# Patient Record
Sex: Female | Born: 1950 | Race: White | Hispanic: No | Marital: Married | Smoking: Never smoker
Health system: Southern US, Community
[De-identification: ages and names within clinical notes are randomized; demographics above are authoritative.]

## PROBLEM LIST (undated history)

## (undated) DIAGNOSIS — M25551 Pain in right hip: Secondary | ICD-10-CM

## (undated) DIAGNOSIS — K279 Peptic ulcer, site unspecified, unspecified as acute or chronic, without hemorrhage or perforation: Secondary | ICD-10-CM

## (undated) DIAGNOSIS — K219 Gastro-esophageal reflux disease without esophagitis: Secondary | ICD-10-CM

## (undated) DIAGNOSIS — F419 Anxiety disorder, unspecified: Secondary | ICD-10-CM

## (undated) DIAGNOSIS — E785 Hyperlipidemia, unspecified: Secondary | ICD-10-CM

## (undated) DIAGNOSIS — M8580 Other specified disorders of bone density and structure, unspecified site: Secondary | ICD-10-CM

## (undated) DIAGNOSIS — R51 Headache: Secondary | ICD-10-CM

## (undated) DIAGNOSIS — R519 Headache, unspecified: Secondary | ICD-10-CM

## (undated) DIAGNOSIS — B029 Zoster without complications: Secondary | ICD-10-CM

## (undated) DIAGNOSIS — M797 Fibromyalgia: Secondary | ICD-10-CM

## (undated) HISTORY — DX: Other specified disorders of bone density and structure, unspecified site: M85.80

## (undated) HISTORY — DX: Anxiety disorder, unspecified: F41.9

## (undated) HISTORY — DX: Headache: R51

## (undated) HISTORY — DX: Fibromyalgia: M79.7

## (undated) HISTORY — DX: Hyperlipidemia, unspecified: E78.5

## (undated) HISTORY — DX: Peptic ulcer, site unspecified, unspecified as acute or chronic, without hemorrhage or perforation: K27.9

## (undated) HISTORY — DX: Gastro-esophageal reflux disease without esophagitis: K21.9

## (undated) HISTORY — DX: Pain in right hip: M25.551

## (undated) HISTORY — DX: Zoster without complications: B02.9

## (undated) HISTORY — DX: Headache, unspecified: R51.9

## (undated) HISTORY — PX: TOTAL HIP ARTHROPLASTY: SHX124

---

## 2004-08-23 ENCOUNTER — Emergency Department (HOSPITAL_COMMUNITY): Admission: EM | Admit: 2004-08-23 | Discharge: 2004-08-23 | Payer: Self-pay | Admitting: Emergency Medicine

## 2006-06-30 ENCOUNTER — Ambulatory Visit: Payer: Self-pay | Admitting: Cardiology

## 2006-07-11 ENCOUNTER — Encounter: Payer: Self-pay | Admitting: Cardiology

## 2006-07-11 ENCOUNTER — Ambulatory Visit: Payer: Self-pay

## 2008-05-10 ENCOUNTER — Ambulatory Visit (HOSPITAL_COMMUNITY): Admission: RE | Admit: 2008-05-10 | Discharge: 2008-05-10 | Payer: Self-pay | Admitting: Family Medicine

## 2008-12-14 ENCOUNTER — Ambulatory Visit (HOSPITAL_COMMUNITY): Admission: RE | Admit: 2008-12-14 | Discharge: 2008-12-14 | Payer: Self-pay | Admitting: Family Medicine

## 2010-12-14 NOTE — Assessment & Plan Note (Signed)
[facility]                            CARDIOLOGY OFFICE NOTE   Leon, Cynthia                   MRN:          [id number]  DATE:06/30/2006                            DOB:          December 28, 1950    REFERRING PHYSICIAN:  Dr. Lindaann Pascal with [facility].   REASON FOR VISIT:  Chest pain.   HISTORY OF PRESENT ILLNESS:  [name] is a 60 year old woman with no  reported history of hypertension, type 2 diabetes mellitus, or chronic  lung disease.  She is a nonsmoker but states that her husband smokes.  She has no clearly documented history of coronary artery disease or  myocardial infarction.  She reports an episode approximately 18 months  ago of being in a meeting with family and suddenly feeling the room  spin associated with nausea, emesis, and chest pain.  She apparently  passed out and was evaluated at [facility] and was told  that she had a anxiety attack.  She has not had any episodes like this  subsequently, but does experience intermittent chest tightness  especially when she is emotionally upset.  She also has a pain in her  posterior neck and head and has frequent headaches which she attributes  to migraines.  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  She has not had any prior  cardiac risk stratification as best I can tell.  Her electrocardiogram  shows sinus rhythm with borderline low voltage and nonspecific ST-T wave  changes.  We have been asked to evaluate her further, specifically to  consider stress testing.   ALLERGIES:  ASPIRIN intolerance due to gastrointestinal upset.   CURRENT MEDICATIONS:  1. Lipitor 20 mg p.o. q.h.s.  2. Metazopine 15 mg p.o. q.h.s.  3. Nexium 40 mg p.o. daily.  4. Multivitamin once daily.   PAST MEDICAL HISTORY:  As outline in the history of present illness.  She also reports problems with  gastroesophageal reflux disease and  hyperlipidemia.   REVIEW OF SYSTEMS:  Described in the history of present illness.  The  patient describes herself as being fairly anxious and hyper.  She does  not have any wheezing.  She does have some seasonal allergies.  She  denies any palpitations, syncope, or claudication recently.   FAMILY HISTORY:  Significant for some type of heart problem in the  patient's sister who developed this in her 62's.   SOCIAL HISTORY:  The patient is married.  She has three children.  She  works in a Higher education careers adviser.  She denies any tobacco or  alcohol use.   PHYSICAL EXAMINATION:  VITAL SIGNS:  Blood pressure 106/72, heart rate  76, weight 199 pounds.  GENERAL:  The patient is comfortable and in no acute distress.  HEENT:  Conjunctivae and lids normal.  Oropharynx is clear.  NECK:  Supple without elevated jugular venous pressure or loud bruits.  No thyromegaly is noted.  LUNGS:  Clear without labored breathing.  No wheezing is noted.  HEART:  Regular rate and rhythm  without loud murmur or S3 gallop.  There  is no pericardial rub.  ABDOMEN:  Soft with normal active bowel sounds.  No tenderness.  No  bruits.  EXTREMITIES:  No significant pitting edema.  Distal pulses are 2+.  SKIN:  No ulcerative changes are noted.  MUSCULOSKELETAL:  Mild kyphosis noted.  NEUROPSYCHIATRIC:  The patient is alert and oriented x3.  She seems  somewhat anxious and fidgety during the interview.   IMPRESSION/RECOMMENDATIONS:  1. As outlined above.  We discussed proceeding with additional cardiac      risk stratification including an exercise Myoview and two-      dimensional echocardiogram.  We will arrange these studies and have      her follow up to review the results.  If reassuring, may want to      consider a pulmonary evaluation particularly with her occasional      dry cough with activity.  She may have some element of underlying      reactive airways  disease.  2. Further plans to follow.     Jonelle Sidle, MD  Electronically Signed    SGM/MedQ  DD: 06/30/2006  DT: 07/01/2006  Job #: [id number]

## 2010-12-20 ENCOUNTER — Encounter: Payer: Self-pay | Admitting: Nurse Practitioner

## 2011-10-10 DIAGNOSIS — G4700 Insomnia, unspecified: Secondary | ICD-10-CM | POA: Diagnosis not present

## 2011-10-10 DIAGNOSIS — E785 Hyperlipidemia, unspecified: Secondary | ICD-10-CM | POA: Diagnosis not present

## 2011-11-28 DIAGNOSIS — L0390 Cellulitis, unspecified: Secondary | ICD-10-CM | POA: Diagnosis not present

## 2011-11-28 DIAGNOSIS — L0291 Cutaneous abscess, unspecified: Secondary | ICD-10-CM | POA: Diagnosis not present

## 2012-03-24 DIAGNOSIS — Z1231 Encounter for screening mammogram for malignant neoplasm of breast: Secondary | ICD-10-CM | POA: Diagnosis not present

## 2012-06-01 DIAGNOSIS — J019 Acute sinusitis, unspecified: Secondary | ICD-10-CM | POA: Diagnosis not present

## 2012-06-22 DIAGNOSIS — M1610 Unilateral primary osteoarthritis, unspecified hip: Secondary | ICD-10-CM | POA: Diagnosis not present

## 2012-06-22 DIAGNOSIS — M25551 Pain in right hip: Secondary | ICD-10-CM | POA: Insufficient documentation

## 2012-06-22 DIAGNOSIS — M25559 Pain in unspecified hip: Secondary | ICD-10-CM | POA: Diagnosis not present

## 2012-06-22 DIAGNOSIS — M169 Osteoarthritis of hip, unspecified: Secondary | ICD-10-CM | POA: Diagnosis not present

## 2012-07-01 DIAGNOSIS — M169 Osteoarthritis of hip, unspecified: Secondary | ICD-10-CM | POA: Diagnosis not present

## 2012-07-01 DIAGNOSIS — M1610 Unilateral primary osteoarthritis, unspecified hip: Secondary | ICD-10-CM | POA: Diagnosis not present

## 2012-08-25 DIAGNOSIS — E785 Hyperlipidemia, unspecified: Secondary | ICD-10-CM | POA: Insufficient documentation

## 2012-09-02 DIAGNOSIS — Z01818 Encounter for other preprocedural examination: Secondary | ICD-10-CM | POA: Diagnosis not present

## 2012-09-02 DIAGNOSIS — M1610 Unilateral primary osteoarthritis, unspecified hip: Secondary | ICD-10-CM | POA: Diagnosis not present

## 2012-09-02 DIAGNOSIS — M169 Osteoarthritis of hip, unspecified: Secondary | ICD-10-CM | POA: Diagnosis not present

## 2012-09-02 DIAGNOSIS — R079 Chest pain, unspecified: Secondary | ICD-10-CM | POA: Diagnosis not present

## 2012-09-02 DIAGNOSIS — R0789 Other chest pain: Secondary | ICD-10-CM | POA: Diagnosis not present

## 2012-09-02 DIAGNOSIS — E785 Hyperlipidemia, unspecified: Secondary | ICD-10-CM | POA: Diagnosis not present

## 2012-09-02 DIAGNOSIS — Z01810 Encounter for preprocedural cardiovascular examination: Secondary | ICD-10-CM | POA: Diagnosis not present

## 2012-09-22 DIAGNOSIS — D62 Acute posthemorrhagic anemia: Secondary | ICD-10-CM | POA: Diagnosis not present

## 2012-09-22 DIAGNOSIS — Z96649 Presence of unspecified artificial hip joint: Secondary | ICD-10-CM | POA: Diagnosis not present

## 2012-09-22 DIAGNOSIS — Z471 Aftercare following joint replacement surgery: Secondary | ICD-10-CM | POA: Diagnosis not present

## 2012-09-22 DIAGNOSIS — M1610 Unilateral primary osteoarthritis, unspecified hip: Secondary | ICD-10-CM | POA: Diagnosis not present

## 2012-09-22 DIAGNOSIS — F909 Attention-deficit hyperactivity disorder, unspecified type: Secondary | ICD-10-CM | POA: Diagnosis present

## 2012-09-22 DIAGNOSIS — M169 Osteoarthritis of hip, unspecified: Secondary | ICD-10-CM | POA: Diagnosis present

## 2012-09-22 DIAGNOSIS — K219 Gastro-esophageal reflux disease without esophagitis: Secondary | ICD-10-CM | POA: Diagnosis present

## 2012-09-22 DIAGNOSIS — E785 Hyperlipidemia, unspecified: Secondary | ICD-10-CM | POA: Diagnosis present

## 2012-09-29 ENCOUNTER — Ambulatory Visit: Payer: Self-pay | Admitting: Physical Therapy

## 2012-10-06 ENCOUNTER — Ambulatory Visit: Payer: BC Managed Care – PPO | Attending: Family Medicine | Admitting: Physical Therapy

## 2012-10-06 DIAGNOSIS — M25559 Pain in unspecified hip: Secondary | ICD-10-CM | POA: Diagnosis not present

## 2012-10-06 DIAGNOSIS — Z96649 Presence of unspecified artificial hip joint: Secondary | ICD-10-CM | POA: Diagnosis not present

## 2012-10-06 DIAGNOSIS — R269 Unspecified abnormalities of gait and mobility: Secondary | ICD-10-CM | POA: Diagnosis not present

## 2012-10-06 DIAGNOSIS — R5381 Other malaise: Secondary | ICD-10-CM | POA: Insufficient documentation

## 2012-10-06 DIAGNOSIS — IMO0001 Reserved for inherently not codable concepts without codable children: Secondary | ICD-10-CM | POA: Diagnosis not present

## 2012-10-08 ENCOUNTER — Ambulatory Visit: Payer: BC Managed Care – PPO | Admitting: Physical Therapy

## 2012-10-08 DIAGNOSIS — R5381 Other malaise: Secondary | ICD-10-CM | POA: Diagnosis not present

## 2012-10-08 DIAGNOSIS — R269 Unspecified abnormalities of gait and mobility: Secondary | ICD-10-CM | POA: Diagnosis not present

## 2012-10-08 DIAGNOSIS — M25559 Pain in unspecified hip: Secondary | ICD-10-CM | POA: Diagnosis not present

## 2012-10-08 DIAGNOSIS — Z96649 Presence of unspecified artificial hip joint: Secondary | ICD-10-CM | POA: Diagnosis not present

## 2012-10-08 DIAGNOSIS — IMO0001 Reserved for inherently not codable concepts without codable children: Secondary | ICD-10-CM | POA: Diagnosis not present

## 2012-10-13 ENCOUNTER — Encounter: Payer: Medicare Other | Admitting: Physical Therapy

## 2012-10-16 ENCOUNTER — Ambulatory Visit: Payer: BC Managed Care – PPO | Admitting: Physical Therapy

## 2012-10-16 DIAGNOSIS — R5381 Other malaise: Secondary | ICD-10-CM | POA: Diagnosis not present

## 2012-10-16 DIAGNOSIS — M25559 Pain in unspecified hip: Secondary | ICD-10-CM | POA: Diagnosis not present

## 2012-10-16 DIAGNOSIS — Z96649 Presence of unspecified artificial hip joint: Secondary | ICD-10-CM | POA: Diagnosis not present

## 2012-10-16 DIAGNOSIS — IMO0001 Reserved for inherently not codable concepts without codable children: Secondary | ICD-10-CM | POA: Diagnosis not present

## 2012-10-16 DIAGNOSIS — R269 Unspecified abnormalities of gait and mobility: Secondary | ICD-10-CM | POA: Diagnosis not present

## 2012-10-18 ENCOUNTER — Other Ambulatory Visit: Payer: Self-pay | Admitting: Physician Assistant

## 2012-10-20 ENCOUNTER — Ambulatory Visit: Payer: BC Managed Care – PPO | Admitting: Physical Therapy

## 2012-10-20 DIAGNOSIS — Z96649 Presence of unspecified artificial hip joint: Secondary | ICD-10-CM | POA: Diagnosis not present

## 2012-10-20 DIAGNOSIS — R5381 Other malaise: Secondary | ICD-10-CM | POA: Diagnosis not present

## 2012-10-20 DIAGNOSIS — R269 Unspecified abnormalities of gait and mobility: Secondary | ICD-10-CM | POA: Diagnosis not present

## 2012-10-20 DIAGNOSIS — M25559 Pain in unspecified hip: Secondary | ICD-10-CM | POA: Diagnosis not present

## 2012-10-20 DIAGNOSIS — IMO0001 Reserved for inherently not codable concepts without codable children: Secondary | ICD-10-CM | POA: Diagnosis not present

## 2012-10-23 ENCOUNTER — Ambulatory Visit: Payer: BC Managed Care – PPO | Admitting: *Deleted

## 2012-10-23 DIAGNOSIS — R269 Unspecified abnormalities of gait and mobility: Secondary | ICD-10-CM | POA: Diagnosis not present

## 2012-10-23 DIAGNOSIS — IMO0001 Reserved for inherently not codable concepts without codable children: Secondary | ICD-10-CM | POA: Diagnosis not present

## 2012-10-23 DIAGNOSIS — R5381 Other malaise: Secondary | ICD-10-CM | POA: Diagnosis not present

## 2012-10-23 DIAGNOSIS — M25559 Pain in unspecified hip: Secondary | ICD-10-CM | POA: Diagnosis not present

## 2012-10-23 DIAGNOSIS — Z96649 Presence of unspecified artificial hip joint: Secondary | ICD-10-CM | POA: Diagnosis not present

## 2012-11-04 DIAGNOSIS — Z96649 Presence of unspecified artificial hip joint: Secondary | ICD-10-CM | POA: Insufficient documentation

## 2012-11-04 DIAGNOSIS — Z471 Aftercare following joint replacement surgery: Secondary | ICD-10-CM | POA: Insufficient documentation

## 2012-12-18 ENCOUNTER — Other Ambulatory Visit: Payer: Self-pay | Admitting: Nurse Practitioner

## 2012-12-23 ENCOUNTER — Other Ambulatory Visit: Payer: Self-pay | Admitting: Nurse Practitioner

## 2012-12-23 NOTE — Telephone Encounter (Signed)
Last chronic follow up on 01-09-12. Please advise

## 2012-12-25 NOTE — Telephone Encounter (Signed)
Last seen CJ on 06/01/12, no future appts made

## 2013-01-17 ENCOUNTER — Other Ambulatory Visit: Payer: Self-pay | Admitting: Nurse Practitioner

## 2013-01-20 ENCOUNTER — Other Ambulatory Visit: Payer: Self-pay | Admitting: Nurse Practitioner

## 2013-01-20 NOTE — Telephone Encounter (Signed)
Last seen 06/01/13, no future appts made

## 2013-01-22 NOTE — Telephone Encounter (Signed)
Patient last seen on 06-01-12 by [facility]. No future appts scheduled. Filled all these meds x 1 last month. Please advise

## 2013-02-10 ENCOUNTER — Other Ambulatory Visit: Payer: Self-pay

## 2013-02-10 NOTE — Telephone Encounter (Signed)
Last seen 06/01/12 [facility]

## 2013-02-17 ENCOUNTER — Ambulatory Visit (INDEPENDENT_AMBULATORY_CARE_PROVIDER_SITE_OTHER): Payer: BC Managed Care – PPO | Admitting: Family Medicine

## 2013-02-17 ENCOUNTER — Encounter: Payer: Self-pay | Admitting: Family Medicine

## 2013-02-17 VITALS — BP 140/78 | HR 86 | Temp 96.6°F | Ht 63.0 in | Wt 222.0 lb

## 2013-02-17 DIAGNOSIS — IMO0001 Reserved for inherently not codable concepts without codable children: Secondary | ICD-10-CM

## 2013-02-17 DIAGNOSIS — F32A Depression, unspecified: Secondary | ICD-10-CM

## 2013-02-17 DIAGNOSIS — M797 Fibromyalgia: Secondary | ICD-10-CM | POA: Insufficient documentation

## 2013-02-17 DIAGNOSIS — E785 Hyperlipidemia, unspecified: Secondary | ICD-10-CM | POA: Diagnosis not present

## 2013-02-17 DIAGNOSIS — J329 Chronic sinusitis, unspecified: Secondary | ICD-10-CM

## 2013-02-17 DIAGNOSIS — G43909 Migraine, unspecified, not intractable, without status migrainosus: Secondary | ICD-10-CM

## 2013-02-17 DIAGNOSIS — F3289 Other specified depressive episodes: Secondary | ICD-10-CM | POA: Diagnosis not present

## 2013-02-17 DIAGNOSIS — K219 Gastro-esophageal reflux disease without esophagitis: Secondary | ICD-10-CM

## 2013-02-17 DIAGNOSIS — E781 Pure hyperglyceridemia: Secondary | ICD-10-CM

## 2013-02-17 DIAGNOSIS — F329 Major depressive disorder, single episode, unspecified: Secondary | ICD-10-CM

## 2013-02-17 LAB — POCT CBC
Granulocyte percent: 65 %G (ref 37–80)
HCT, POC: 35 % — AB (ref 37.7–47.9)
Hemoglobin: 11.7 g/dL — AB (ref 12.2–16.2)
Lymph, poc: 1.9 (ref 0.6–3.4)
MCH, POC: 26.7 pg — AB (ref 27–31.2)
MCHC: 33.6 g/dL (ref 31.8–35.4)
MCV: 79.6 fL — AB (ref 80–97)
MPV: 10.3 fL (ref 0–99.8)
POC Granulocyte: 4.7 (ref 2–6.9)
POC LYMPH PERCENT: 26.4 %L (ref 10–50)
Platelet Count, POC: 282 10*3/uL (ref 142–424)
RBC: 4.4 M/uL (ref 4.04–5.48)
RDW, POC: 17.8 %
WBC: 7.2 10*3/uL (ref 4.6–10.2)

## 2013-02-17 MED ORDER — OMEPRAZOLE-SODIUM BICARBONATE 40-1100 MG PO CAPS: ORAL_CAPSULE | ORAL | Status: DC

## 2013-02-17 MED ORDER — ESCITALOPRAM OXALATE 10 MG PO TABS: 10.0000 mg | ORAL_TABLET | Freq: Every day | ORAL | Status: DC

## 2013-02-17 MED ORDER — TOPIRAMATE 50 MG PO TABS: 50.0000 mg | ORAL_TABLET | Freq: Two times a day (BID) | ORAL | Status: DC

## 2013-02-17 MED ORDER — FENOFIBRIC ACID 135 MG PO CPDR: 135.0000 mg | DELAYED_RELEASE_CAPSULE | Freq: Every day | ORAL | Status: DC

## 2013-02-17 MED ORDER — AMOXICILLIN 875 MG PO TABS: 875.0000 mg | ORAL_TABLET | Freq: Two times a day (BID) | ORAL | Status: DC

## 2013-02-17 MED ORDER — CYCLOBENZAPRINE HCL 10 MG PO TABS: 10.0000 mg | ORAL_TABLET | ORAL | Status: DC | PRN

## 2013-02-17 MED ORDER — ROSUVASTATIN CALCIUM 20 MG PO TABS: 20.0000 mg | ORAL_TABLET | Freq: Every day | ORAL | Status: DC

## 2013-02-17 MED ORDER — MIRTAZAPINE 30 MG PO TABS: 30.0000 mg | ORAL_TABLET | Freq: Every day | ORAL | Status: DC

## 2013-02-17 MED ORDER — MILNACIPRAN HCL 50 MG PO TABS: 50.0000 mg | ORAL_TABLET | Freq: Two times a day (BID) | ORAL | Status: DC

## 2013-02-17 NOTE — Addendum Note (Signed)
Addended by: Deatra Canter on: 02/17/2013 06:24 PM   Modules accepted: Orders

## 2013-02-17 NOTE — Patient Instructions (Signed)
Hypertriglyceridemia  Diet for High blood levels of Triglycerides Most fats in food are triglycerides. Triglycerides in your blood are stored as fat in your body. High levels of triglycerides in your blood may put you at a greater risk for heart disease and stroke.  Normal triglyceride levels are less than 150 mg/dL. Borderline high levels are 150-199 mg/dl. High levels are 200 - 499 mg/dL, and very high triglyceride levels are greater than 500 mg/dL. The decision to treat high triglycerides is generally based on the level. For people with borderline or high triglyceride levels, treatment includes weight loss and exercise. Drugs are recommended for people with very high triglyceride levels. Many people who need treatment for high triglyceride levels have metabolic syndrome. This syndrome is a collection of disorders that often include: insulin resistance, high blood pressure, blood clotting problems, high cholesterol and triglycerides. TESTING PROCEDURE FOR TRIGLYCERIDES  You should not eat 4 hours before getting your triglycerides measured. The normal range of triglycerides is between 10 and 250 milligrams per deciliter (mg/dl). Some people may have extreme levels (1000 or above), but your triglyceride level may be too high if it is above 150 mg/dl, depending on what other risk factors you have for heart disease.  People with high blood triglycerides may also have high blood cholesterol levels. If you have high blood cholesterol as well as high blood triglycerides, your risk for heart disease is probably greater than if you only had high triglycerides. High blood cholesterol is one of the main risk factors for heart disease. CHANGING YOUR DIET  Your weight can affect your blood triglyceride level. If you are more than 20% above your ideal body weight, you may be able to lower your blood triglycerides by losing weight. Eating less and exercising regularly is the best way to combat this. Fat provides more  calories than any other food. The best way to lose weight is to eat less fat. Only 30% of your total calories should come from fat. Less than 7% of your diet should come from saturated fat. A diet low in fat and saturated fat is the same as a diet to decrease blood cholesterol. By eating a diet lower in fat, you may lose weight, lower your blood cholesterol, and lower your blood triglyceride level.  Eating a diet low in fat, especially saturated fat, may also help you lower your blood triglyceride level. Ask your dietitian to help you figure how much fat you can eat based on the number of calories your caregiver has prescribed for you.  Exercise, in addition to helping with weight loss may also help lower triglyceride levels.   Alcohol can increase blood triglycerides. You may need to stop drinking alcoholic beverages.  Too much carbohydrate in your diet may also increase your blood triglycerides. Some complex carbohydrates are necessary in your diet. These may include bread, rice, potatoes, other starchy vegetables and cereals.  Reduce "simple" carbohydrates. These may include pure sugars, candy, honey, and jelly without losing other nutrients. If you have the kind of high blood triglycerides that is affected by the amount of carbohydrates in your diet, you will need to eat less sugar and less high-sugar foods. Your caregiver can help you with this.  Adding 2-4 grams of fish oil (EPA+ DHA) may also help lower triglycerides. Speak with your caregiver before adding any supplements to your regimen. Following the Diet  Maintain your ideal weight. Your caregivers can help you with a diet. Generally, eating less food and getting more   exercise will help you lose weight. Joining a weight control group may also help. Ask your caregivers for a good weight control group in your area.  Eat low-fat foods instead of high-fat foods. This can help you lose weight too.  These foods are lower in fat. Eat MORE of these:    Dried beans, peas, and lentils.  Egg whites.  Low-fat cottage cheese.  Fish.  Lean cuts of meat, such as round, sirloin, rump, and flank (cut extra fat off meat you fix).  Whole grain breads, cereals and pasta.  Skim and nonfat dry milk.  Low-fat yogurt.  Poultry without the skin.  Cheese made with skim or part-skim milk, such as mozzarella, parmesan, farmers', ricotta, or pot cheese. These are higher fat foods. Eat LESS of these:   Whole milk and foods made from whole milk, such as American, blue, cheddar, monterey jack, and swiss cheese  High-fat meats, such as luncheon meats, sausages, knockwurst, bratwurst, hot dogs, ribs, corned beef, ground pork, and regular ground beef.  Fried foods. Limit saturated fats in your diet. Substituting unsaturated fat for saturated fat may decrease your blood triglyceride level. You will need to read package labels to know which products contain saturated fats.  These foods are high in saturated fat. Eat LESS of these:   Fried pork skins.  Whole milk.  Skin and fat from poultry.  Palm oil.  Butter.  Shortening.  Cream cheese.  Bacon.  Margarines and baked goods made from listed oils.  Vegetable shortenings.  Chitterlings.  Fat from meats.  Coconut oil.  Palm kernel oil.  Lard.  Cream.  Sour cream.  Fatback.  Coffee whiteners and non-dairy creamers made with these oils.  Cheese made from whole milk. Use unsaturated fats (both polyunsaturated and monounsaturated) moderately. Remember, even though unsaturated fats are better than saturated fats; you still want a diet low in total fat.  These foods are high in unsaturated fat:   Canola oil.  Sunflower oil.  Mayonnaise.  Almonds.  Peanuts.  Pine nuts.  Margarines made with these oils.  Safflower oil.  Olive oil.  Avocados.  Cashews.  Peanut butter.  Sunflower seeds.  Soybean oil.  Peanut  oil.  Olives.  Pecans.  Walnuts.  Pumpkin seeds. Avoid sugar and other high-sugar foods. This will decrease carbohydrates without decreasing other nutrients. Sugar in your food goes rapidly to your blood. When there is excess sugar in your blood, your liver may use it to make more triglycerides. Sugar also contains calories without other important nutrients.  Eat LESS of these:   Sugar, brown sugar, powdered sugar, jam, jelly, preserves, honey, syrup, molasses, pies, candy, cakes, cookies, frosting, pastries, colas, soft drinks, punches, fruit drinks, and regular gelatin.  Avoid alcohol. Alcohol, even more than sugar, may increase blood triglycerides. In addition, alcohol is high in calories and low in nutrients. Ask for sparkling water, or a diet soft drink instead of an alcoholic beverage. Suggestions for planning and preparing meals   Bake, broil, grill or roast meats instead of frying.  Remove fat from meats and skin from poultry before cooking.  Add spices, herbs, lemon juice or vinegar to vegetables instead of salt, rich sauces or gravies.  Use a non-stick skillet without fat or use no-stick sprays.  Cool and refrigerate stews and broth. Then remove the hardened fat floating on the surface before serving.  Refrigerate meat drippings and skim off fat to make low-fat gravies.  Serve more fish.  Use less butter,   margarine and other high-fat spreads on bread or vegetables.  Use skim or reconstituted non-fat dry milk for cooking.  Cook with low-fat cheeses.  Substitute low-fat yogurt or cottage cheese for all or part of the sour cream in recipes for sauces, dips or congealed salads.  Use half yogurt/half mayonnaise in salad recipes.  Substitute evaporated skim milk for cream. Evaporated skim milk or reconstituted non-fat dry milk can be whipped and substituted for whipped cream in certain recipes.  Choose fresh fruits for dessert instead of high-fat foods such as pies or  cakes. Fruits are naturally low in fat. When Dining Out   Order low-fat appetizers such as fruit or vegetable juice, pasta with vegetables or tomato sauce.  Select clear, rather than cream soups.  Ask that dressings and gravies be served on the side. Then use less of them.  Order foods that are baked, broiled, poached, steamed, stir-fried, or roasted.  Ask for margarine instead of butter, and use only a small amount.  Drink sparkling water, unsweetened tea or coffee, or diet soft drinks instead of alcohol or other sweet beverages. QUESTIONS AND ANSWERS ABOUT OTHER FATS IN THE BLOOD: SATURATED FAT, TRANS FAT, AND CHOLESTEROL What is trans fat? Trans fat is a type of fat that is formed when vegetable oil is hardened through a process called hydrogenation. This process helps makes foods more solid, gives them shape, and prolongs their shelf life. Trans fats are also called hydrogenated or partially hydrogenated oils.  What do saturated fat, trans fat, and cholesterol in foods have to do with heart disease? Saturated fat, trans fat, and cholesterol in the diet all raise the level of LDL "bad" cholesterol in the blood. The higher the LDL cholesterol, the greater the risk for coronary heart disease (CHD). Saturated fat and trans fat raise LDL similarly.  What foods contain saturated fat, trans fat, and cholesterol? High amounts of saturated fat are found in animal products, such as fatty cuts of meat, chicken skin, and full-fat dairy products like butter, whole milk, cream, and cheese, and in tropical vegetable oils such as palm, palm kernel, and coconut oil. Trans fat is found in some of the same foods as saturated fat, such as vegetable shortening, some margarines (especially hard or stick margarine), crackers, cookies, baked goods, fried foods, salad dressings, and other processed foods made with partially hydrogenated vegetable oils. Small amounts of trans fat also occur naturally in some animal  products, such as milk products, beef, and lamb. Foods high in cholesterol include liver, other organ meats, egg yolks, shrimp, and full-fat dairy products. How can I use the new food label to make heart-healthy food choices? Check the Nutrition Facts panel of the food label. Choose foods lower in saturated fat, trans fat, and cholesterol. For saturated fat and cholesterol, you can also use the Percent Daily Value (%DV): 5% DV or less is low, and 20% DV or more is high. (There is no %DV for trans fat.) Use the Nutrition Facts panel to choose foods low in saturated fat and cholesterol, and if the trans fat is not listed, read the ingredients and limit products that list shortening or hydrogenated or partially hydrogenated vegetable oil, which tend to be high in trans fat. POINTS TO REMEMBER:   Discuss your risk for heart disease with your caregivers, and take steps to reduce risk factors.  Change your diet. Choose foods that are low in saturated fat, trans fat, and cholesterol.  Add exercise to your daily routine if   it is not already being done. Participate in physical activity of moderate intensity, like brisk walking, for at least 30 minutes on most, and preferably all days of the week. No time? Break the 30 minutes into three, 10-minute segments during the day.  Stop smoking. If you do smoke, contact your caregiver to discuss ways in which they can help you quit.  Do not use street drugs.  Maintain a normal weight.  Maintain a healthy blood pressure.  Keep up with your blood work for checking the fats in your blood as directed by your caregiver. Document Released: 05/02/2004 Document Revised: 01/14/2012 Document Reviewed: 11/28/2008 [employer] Patient Information 2014 [employer].  

## 2013-02-17 NOTE — Progress Notes (Signed)
  Subjective:    Patient ID: Cynthia Leon, female    DOB: 24-May-1951, 62 y.o.   MRN: [id number]  HPI This 62 y.o. female presents for evaluation of myalgias.  She has hx of FMS, hyperlipidemia, vitaminD deficiency, OA, GERD, depression, and migraine headaches .   Review of Systems No chest pain, SOB, HA, dizziness, vision change, N/V, diarrhea, constipation, dysuria, urinary urgency or frequency, myalgias, arthralgias or rash.     Objective:   Physical Exam Vital signs noted  Well developed well nourished female.  HEENT - Head atraumatic Normocephalic                Eyes - PERRLA, Conjuctiva - clear Sclera- Clear EOMI                Ears - EAC's Wnl TM's Wnl Gross Hearing WNL                Nose - Nares patent                 Throat - oropharanx wnl Respiratory - Lungs CTA bilateral Cardiac - RRR S1 and S2 without murmur GI - Abdomen soft Nontender and bowel sounds active x 4 Extremities - No edema. Neuro - Grossly intact.       Assessment & Plan:  Myalgia and myositis - Plan: cyclobenzaprine (FLEXERIL) 10 MG tablet  Depression - Plan: escitalopram (LEXAPRO) 10 MG tablet, mirtazapine (REMERON) 30 MG tablet  Other and unspecified hyperlipidemia - Plan: rosuvastatin (CRESTOR) 20 MG tablet  GERD (gastroesophageal reflux disease) - Plan: omeprazole-sodium bicarbonate (ZEGERID) 40-1100 MG per capsule  Fibromyalgia - Plan: Milnacipran (SAVELLA) 50 MG TABS  High triglycerides - Plan: Choline Fenofibrate (FENOFIBRIC ACID) 135 MG CPDR.  Migraine - Plan: topiramate (TOPAMAX) 50 MG tablet bid and is controlling migraine headaches well.  Sinusitis - Plan: amoxicillin (AMOXIL) 875 MG tablet po bid x 10 days #20  Follow up in 3 months

## 2013-02-18 LAB — LIPID PANEL
Cholesterol: 157 mg/dL (ref 0–200)
HDL: 45 mg/dL (ref >39)
LDL Cholesterol: 80 mg/dL (ref 0–99)
Total CHOL/HDL Ratio: 3.5 Ratio
Triglycerides: 158 mg/dL — ABNORMAL HIGH (ref <150)
VLDL: 32 mg/dL (ref 0–40)

## 2013-02-18 LAB — COMPLETE METABOLIC PANEL WITH GFR
ALT: 26 U/L (ref 0–35)
AST: 21 U/L (ref 0–37)
Albumin: 4.4 g/dL (ref 3.5–5.2)
Alkaline Phosphatase: 56 U/L (ref 39–117)
BUN: 11 mg/dL (ref 6–23)
CO2: 24 mEq/L (ref 19–32)
Calcium: 9.7 mg/dL (ref 8.4–10.5)
Chloride: 105 mEq/L (ref 96–112)
Creat: 0.85 mg/dL (ref 0.50–1.10)
GFR, Est African American: 85 mL/min
GFR, Est Non African American: 74 mL/min
Glucose, Bld: 98 mg/dL (ref 70–99)
Potassium: 4.6 mEq/L (ref 3.5–5.3)
Sodium: 139 mEq/L (ref 135–145)
Total Bilirubin: 0.3 mg/dL (ref 0.3–1.2)
Total Protein: 6.8 g/dL (ref 6.0–8.3)

## 2013-02-18 LAB — TSH: TSH: 1.926 u[IU]/mL (ref 0.350–4.500)

## 2013-05-20 ENCOUNTER — Ambulatory Visit (INDEPENDENT_AMBULATORY_CARE_PROVIDER_SITE_OTHER): Payer: BC Managed Care – PPO | Admitting: Family Medicine

## 2013-05-20 ENCOUNTER — Encounter: Payer: Self-pay | Admitting: Family Medicine

## 2013-05-20 VITALS — BP 142/86 | HR 90 | Temp 98.2°F | Ht 66.0 in | Wt 228.2 lb

## 2013-05-20 DIAGNOSIS — R5381 Other malaise: Secondary | ICD-10-CM | POA: Diagnosis not present

## 2013-05-20 DIAGNOSIS — R635 Abnormal weight gain: Secondary | ICD-10-CM | POA: Diagnosis not present

## 2013-05-20 DIAGNOSIS — D649 Anemia, unspecified: Secondary | ICD-10-CM | POA: Diagnosis not present

## 2013-05-20 DIAGNOSIS — I10 Essential (primary) hypertension: Secondary | ICD-10-CM | POA: Diagnosis not present

## 2013-05-20 MED ORDER — METOPROLOL SUCCINATE ER 25 MG PO TB24: 25.0000 mg | ORAL_TABLET | Freq: Every day | ORAL | Status: DC

## 2013-05-20 NOTE — Progress Notes (Signed)
  Subjective:    Patient ID: Cynthia Leon, female    DOB: 01-29-1951, 62 y.o.   MRN: [id number]  HPI This 62 y.o. female presents for evaluation of follow up.  She has been having anemia. She is taking MVI otc.  She has been having dizziness spells on occasion.  She states When she gets dizzy she has to sit down due to feeling weak.  She states she has  Been to the hospital for this and had stress tests and all work was normal.  She has  Been having this she states since she was 62 years old.   Review of Systems C/o dizziness and weakness. No chest pain, SOB, HA, vision change, N/V, diarrhea, constipation, dysuria, urinary urgency or frequency, myalgias, arthralgias or rash.     Objective:   Physical Exam Vital signs noted  Well developed well nourished female.  HEENT - Head atraumatic Normocephalic                Eyes - PERRLA, Conjuctiva - clear Sclera- Clear EOMI                Ears - EAC's Wnl TM's Wnl Gross Hearing WNL                Nose - Nares patent                 Throat - oropharanx wnl Respiratory - Lungs CTA bilateral Cardiac - RRR S1 and S2 without murmur GI - Abdomen soft Nontender and bowel sounds active x 4 Extremities - No edema. Neuro - Grossly intact.       Assessment & Plan:  Essential hypertension, benign - Plan: metoprolol succinate (TOPROL-XL) 25 MG 24 hr tablet.  Dizziness - Discussed she may notice this declining with the use of toprol and if continues would recommend tilt table testing.  Anemia - Plan: Anemia panel, CANCELED: POCT CBC  Other malaise and fatigue - Check cbc, anemia panel and TSH.  Discussed need for exercise to Avoid deconditioning.  Weight gain - Plan: TSH, CMP14+EGFR, CANCELED: POCT CBC.  Discussed need for diet and exercise.  Deatra Canter FNP

## 2013-05-21 LAB — CMP14+EGFR
ALT: 26 IU/L (ref 0–32)
AST: 20 IU/L (ref 0–40)
Albumin/Globulin Ratio: 2.3 (ref 1.1–2.5)
Albumin: 4.8 g/dL (ref 3.6–4.8)
Alkaline Phosphatase: 61 IU/L (ref 39–117)
BUN/Creatinine Ratio: 11 (ref 11–26)
BUN: 10 mg/dL (ref 8–27)
CO2: 22 mmol/L (ref 18–29)
Calcium: 10.5 mg/dL — ABNORMAL HIGH (ref 8.6–10.2)
Chloride: 102 mmol/L (ref 97–108)
Creatinine, Ser: 0.9 mg/dL (ref 0.57–1.00)
GFR calc Af Amer: 79 mL/min/{1.73_m2} (ref >59)
GFR calc non Af Amer: 69 mL/min/{1.73_m2} (ref >59)
Globulin, Total: 2.1 g/dL (ref 1.5–4.5)
Glucose: 101 mg/dL — ABNORMAL HIGH (ref 65–99)
Potassium: 4.7 mmol/L (ref 3.5–5.2)
Sodium: 142 mmol/L (ref 134–144)
Total Bilirubin: <0.2 mg/dL (ref 0.0–1.2)
Total Protein: 6.9 g/dL (ref 6.0–8.5)

## 2013-05-21 LAB — TSH: TSH: 2.01 u[IU]/mL (ref 0.450–4.500)

## 2013-06-03 ENCOUNTER — Encounter: Payer: Self-pay | Admitting: *Deleted

## 2013-06-03 NOTE — Progress Notes (Signed)
Quick Note:  Copy of labs sent to patient ______ 

## 2013-06-21 ENCOUNTER — Ambulatory Visit: Payer: MEDICARE | Admitting: Family Medicine

## 2013-06-29 ENCOUNTER — Encounter: Payer: Self-pay | Admitting: Family Medicine

## 2013-06-29 ENCOUNTER — Ambulatory Visit (INDEPENDENT_AMBULATORY_CARE_PROVIDER_SITE_OTHER): Payer: BC Managed Care – PPO | Admitting: Family Medicine

## 2013-06-29 VITALS — BP 137/86 | HR 93 | Temp 97.0°F | Ht 66.0 in | Wt 232.0 lb

## 2013-06-29 DIAGNOSIS — J302 Other seasonal allergic rhinitis: Secondary | ICD-10-CM

## 2013-06-29 DIAGNOSIS — IMO0001 Reserved for inherently not codable concepts without codable children: Secondary | ICD-10-CM

## 2013-06-29 DIAGNOSIS — J309 Allergic rhinitis, unspecified: Secondary | ICD-10-CM | POA: Diagnosis not present

## 2013-06-29 MED ORDER — CYCLOBENZAPRINE HCL 10 MG PO TABS: 10.0000 mg | ORAL_TABLET | ORAL | Status: DC | PRN

## 2013-06-29 MED ORDER — FLUTICASONE PROPIONATE 50 MCG/ACT NA SUSP: 2.0000 | Freq: Every day | NASAL | Status: DC

## 2013-06-29 NOTE — Progress Notes (Signed)
   Subjective:    Patient ID: [name], female    DOB: 01/29/1951, 62 y.o.   MRN: [id number]  HPI  This 62 y.o. female presents for evaluation of needing refills on her FMS medicine Cyclobenzaprine and SAR medicine flonase.  She is doing fine otherwise..  Review of Systems No chest pain, SOB, HA, dizziness, vision change, N/V, diarrhea, constipation, dysuria, urinary urgency or frequency, myalgias, arthralgias or rash.     Objective:   Physical Exam  Vital signs noted  Well developed well nourished female.  HEENT - Head atraumatic Normocephalic                Eyes - PERRLA, Conjuctiva - clear Sclera- Clear EOMI                Ears - EAC's Wnl TM's Wnl Gross Hearing WNL                Nose - Nares patent                 Throat - oropharanx wnl Respiratory - Lungs CTA bilateral Cardiac - RRR S1 and S2 without murmur GI - Abdomen soft Nontender and bowel sounds active x 4 Extremities - No edema. Neuro - Grossly intact.      Assessment & Plan:  Myalgia and myositis - Plan: cyclobenzaprine (FLEXERIL) 10 MG tablet  Seasonal allergies - Flonase NS one to two sprays per nostril qd #1/6rf  Deatra Canter FNP

## 2013-06-29 NOTE — Patient Instructions (Signed)
Allergies °Allergies may happen from anything your body is sensitive to. This may be food, medicines, pollens, chemicals, and nearly anything around you in everyday life that produces allergens. An allergen is anything that causes an allergy producing substance. Heredity is often a factor in causing these problems. This means you may have some of the same allergies as your parents. °Food allergies happen in all age groups. Food allergies are some of the most severe and life threatening. Some common food allergies are cow's milk, seafood, eggs, nuts, wheat, and soybeans. °SYMPTOMS  °· Swelling around the mouth. °· An itchy red rash or hives. °· Vomiting or diarrhea. °· Difficulty breathing. °SEVERE ALLERGIC REACTIONS ARE LIFE-THREATENING. °This reaction is called anaphylaxis. It can cause the mouth and throat to swell and cause difficulty with breathing and swallowing. In severe reactions only a trace amount of food (for example, peanut oil in a salad) may cause death within seconds. °Seasonal allergies occur in all age groups. These are seasonal because they usually occur during the same season every year. They may be a reaction to molds, grass pollens, or tree pollens. Other causes of problems are house dust mite allergens, pet dander, and mold spores. The symptoms often consist of nasal congestion, a runny itchy nose associated with sneezing, and tearing itchy eyes. There is often an associated itching of the mouth and ears. The problems happen when you come in contact with pollens and other allergens. Allergens are the particles in the air that the body reacts to with an allergic reaction. This causes you to release allergic antibodies. Through a chain of events, these eventually cause you to release histamine into the blood stream. Although it is meant to be protective to the body, it is this release that causes your discomfort. This is why you were given anti-histamines to feel better.  If you are unable to  pinpoint the offending allergen, it may be determined by skin or blood testing. Allergies cannot be cured but can be controlled with medicine. °Hay fever is a collection of all or some of the seasonal allergy problems. It may often be treated with simple over-the-counter medicine such as diphenhydramine. Take medicine as directed. Do not drink alcohol or drive while taking this medicine. Check with your caregiver or package insert for child dosages. °If these medicines are not effective, there are many new medicines your caregiver can prescribe. Stronger medicine such as nasal spray, eye drops, and corticosteroids may be used if the first things you try do not work well. Other treatments such as immunotherapy or desensitizing injections can be used if all else fails. Follow up with your caregiver if problems continue. These seasonal allergies are usually not life threatening. They are generally more of a nuisance that can often be handled using medicine. °HOME CARE INSTRUCTIONS  °· If unsure what causes a reaction, keep a diary of foods eaten and symptoms that follow. Avoid foods that cause reactions. °· If hives or rash are present: °· Take medicine as directed. °· You may use an over-the-counter antihistamine (diphenhydramine) for hives and itching as needed. °· Apply cold compresses (cloths) to the skin or take baths in cool water. Avoid hot baths or showers. Heat will make a rash and itching worse. °· If you are severely allergic: °· Following a treatment for a severe reaction, hospitalization is often required for closer follow-up. °· Wear a medic-alert bracelet or necklace stating the allergy. °· You and your family must learn how to give adrenaline or use   an anaphylaxis kit. °· If you have had a severe reaction, always carry your anaphylaxis kit or EpiPen® with you. Use this medicine as directed by your caregiver if a severe reaction is occurring. Failure to do so could have a fatal outcome. °SEEK MEDICAL  CARE IF: °· You suspect a food allergy. Symptoms generally happen within 30 minutes of eating a food. °· Your symptoms have not gone away within 2 days or are getting worse. °· You develop new symptoms. °· You want to retest yourself or your child with a food or drink you think causes an allergic reaction. Never do this if an anaphylactic reaction to that food or drink has happened before. Only do this under the care of a caregiver. °SEEK IMMEDIATE MEDICAL CARE IF:  °· You have difficulty breathing, are wheezing, or have a tight feeling in your chest or throat. °· You have a swollen mouth, or you have hives, swelling, or itching all over your body. °· You have had a severe reaction that has responded to your anaphylaxis kit or an EpiPen®. These reactions may return when the medicine has worn off. These reactions should be considered life threatening. °MAKE SURE YOU:  °· Understand these instructions. °· Will watch your condition. °· Will get help right away if you are not doing well or get worse. °Document Released: 10/08/2002 Document Revised: 11/09/2012 Document Reviewed: 03/14/2008 °[employer]® Patient Information ©2014 [employer]. ° °

## 2013-09-17 ENCOUNTER — Ambulatory Visit (INDEPENDENT_AMBULATORY_CARE_PROVIDER_SITE_OTHER): Payer: PRIVATE HEALTH INSURANCE | Admitting: General Practice

## 2013-09-17 VITALS — BP 123/71 | HR 89 | Temp 98.9°F | Wt 234.0 lb

## 2013-09-17 DIAGNOSIS — J0100 Acute maxillary sinusitis, unspecified: Secondary | ICD-10-CM

## 2013-09-17 DIAGNOSIS — J069 Acute upper respiratory infection, unspecified: Secondary | ICD-10-CM

## 2013-09-17 DIAGNOSIS — R05 Cough: Secondary | ICD-10-CM | POA: Diagnosis not present

## 2013-09-17 DIAGNOSIS — R059 Cough, unspecified: Secondary | ICD-10-CM

## 2013-09-17 LAB — POCT INFLUENZA A/B
Influenza A, POC: NEGATIVE
Influenza B, POC: NEGATIVE

## 2013-09-17 MED ORDER — AZITHROMYCIN 250 MG PO TABS: ORAL_TABLET | ORAL | Status: DC

## 2013-09-17 MED ORDER — BENZONATATE 100 MG PO CAPS: 100.0000 mg | ORAL_CAPSULE | Freq: Three times a day (TID) | ORAL | Status: DC | PRN

## 2013-09-17 MED ORDER — METHYLPREDNISOLONE ACETATE 80 MG/ML IJ SUSP
80.0000 mg | Freq: Once | INTRAMUSCULAR | Status: AC
  Administered 2013-09-17: 80 mg via INTRAMUSCULAR

## 2013-09-17 NOTE — Patient Instructions (Signed)

## 2013-09-17 NOTE — Progress Notes (Signed)
   Subjective:    Patient ID: Cynthia Leon, female    DOB: 08/29/1950, 63 y.o.   MRN: [id number]  Cough This is a new problem. The current episode started in the past 7 days. The problem has been gradually worsening. The problem occurs every few minutes. The cough is non-productive. Associated symptoms include myalgias, nasal congestion and postnasal drip. Pertinent negatives include no chest pain, chills, fever or shortness of breath. The symptoms are aggravated by lying down. Her past medical history is significant for pneumonia. There is no history of asthma or bronchitis.      Review of Systems  Constitutional: Negative for fever and chills.  HENT: Positive for congestion, postnasal drip and sinus pressure.   Respiratory: Negative for chest tightness and shortness of breath.   Cardiovascular: Negative for chest pain and palpitations.  Musculoskeletal: Positive for myalgias.       Objective:   Physical Exam  Constitutional: She is oriented to person, place, and time. She appears well-developed and well-nourished.  HENT:  Head: Normocephalic and atraumatic.  Nose: Right sinus exhibits maxillary sinus tenderness and frontal sinus tenderness. Left sinus exhibits maxillary sinus tenderness and frontal sinus tenderness.  Mouth/Throat: Posterior oropharyngeal erythema present.  Cardiovascular: Normal rate, regular rhythm and normal heart sounds.   No murmur heard. Pulmonary/Chest: Effort normal and breath sounds normal. No respiratory distress. She exhibits no tenderness.  Neurological: She is alert and oriented to person, place, and time.  Skin: Skin is warm and dry. No rash noted.  Psychiatric: She has a normal mood and affect.     Results for orders placed in visit on 09/17/13  POCT INFLUENZA A/B      Result Value Ref Range   Influenza A, POC Negative     Influenza B, POC Negative          Assessment & Plan:  1. Cough  - Influenza A/B - benzonatate (TESSALON)  100 MG capsule; Take 1 capsule (100 mg total) by mouth 3 (three) times daily as needed.  Dispense: 30 capsule; Refill: 0  2. Upper respiratory infection  - azithromycin (ZITHROMAX) 250 MG tablet; Take as directed  Dispense: 6 tablet; Refill: 0  3. Sinusitis, acute maxillary  - methylPREDNISolone acetate (DEPO-MEDROL) injection 80 mg; Inject 1 mL (80 mg total) into the muscle once. -increase fluids -RTO prn Patient verbalized understanding Erby Pian, FNP-C

## 2013-10-04 ENCOUNTER — Telehealth: Payer: Self-pay | Admitting: General Practice

## 2013-10-07 ENCOUNTER — Other Ambulatory Visit: Payer: Self-pay | Admitting: Family Medicine

## 2013-10-26 ENCOUNTER — Ambulatory Visit: Payer: PRIVATE HEALTH INSURANCE | Admitting: General Practice

## 2013-10-28 ENCOUNTER — Telehealth: Payer: Self-pay | Admitting: Family Medicine

## 2013-10-28 NOTE — Telephone Encounter (Signed)
Feels that some of her medications aren't working well together.  Asked her to speak with her pharmacist and if she doesn't get a good response then she can schedule an appt with our clinical pharmacist.

## 2013-11-01 ENCOUNTER — Telehealth: Payer: Self-pay | Admitting: Family Medicine

## 2013-11-01 NOTE — Telephone Encounter (Signed)
appt with bill oxford in the am

## 2013-11-02 ENCOUNTER — Ambulatory Visit (INDEPENDENT_AMBULATORY_CARE_PROVIDER_SITE_OTHER): Payer: PRIVATE HEALTH INSURANCE | Admitting: Family Medicine

## 2013-11-02 ENCOUNTER — Encounter: Payer: Self-pay | Admitting: Family Medicine

## 2013-11-02 VITALS — BP 135/86 | HR 86 | Temp 98.5°F | Ht 66.0 in | Wt 236.2 lb

## 2013-11-02 DIAGNOSIS — F411 Generalized anxiety disorder: Secondary | ICD-10-CM

## 2013-11-02 MED ORDER — ALPRAZOLAM 0.5 MG PO TABS: 0.5000 mg | ORAL_TABLET | Freq: Every evening | ORAL | Status: DC | PRN

## 2013-11-02 NOTE — Progress Notes (Signed)
   Subjective:    Patient ID: Cynthia Leon, female    DOB: 1951/04/06, 63 y.o.   MRN: [id number]  HPI This 63 y.o. female presents for evaluation of fatigue and not feeling good.  She states she is on too Much medicine.  She has been worried about taking metoprolol, escitalopram, and cyclobenzaprine and she is worried all these are not mixing well.  She has hx of FMS and has been taking savella for FMS for the last few years.  She has been taking lexapro which was added for depression sx's a few  Months ago.  She is taking topamax for chronic migraine headaches.  She is taking flexeril for neck discomfort.  She is taking metoprolol for hypertension.  She states her mind moves very fast and much faster than everyone elses.  She c/o insomnia, tremors, and diaphoresis.  She states she has ADD and thinks like someone with ADD.    Vital signs noted  Well developed well nourished female.  HEENT - Head atraumatic Normocephalic                Eyes - PERRLA, Conjuctiva - clear Sclera- Clear EOMI                Ears - EAC's Wnl TM's Wnl Gross Hearing WNL                Nose - Nares patent                 Throat - oropharanx wnl Respiratory - Lungs CTA bilateral Cardiac - RRR S1 and S2 without murmur GI - Abdomen soft Nontender and bowel sounds active x 4 Extremities - No edema. Neuro - Grossly intact.  Review of Systems    No chest pain, SOB, HA, dizziness, vision change, N/V, diarrhea, constipation, dysuria, urinary urgency or frequency, myalgias, arthralgias or rash.  Objective:   Physical Exam Vital signs noted  Well developed well nourished anxious female.  HEENT - Head atraumatic Normocephalic                Eyes - PERRLA, Conjuctiva - clear Sclera- Clear EOMI                Ears - EAC's Wnl TM's Wnl Gross Hearing WNL                Throat - oropharanx wnl Respiratory - Lungs CTA bilateral Cardiac - RRR S1 and S2 without murmur GI - Abdomen soft Nontender and bowel sounds  active x 4 Extremities - No edema. Neuro - Grossly intact. Psych - She has tangential thinking and she rambles      Assessment & Plan:  Anxiety state, unspecified - Plan: ALPRAZolam (XANAX) 0.5 MG tablet Recommend she get herself a Pshychiatry visit ASAP and hold the lexapro because she may Be experiencing serotonin syndrome.  I explained to her that she needs a formal interview with Psychiatry first before any ADHD meds can be rx'd.  Follow up after psychiatry appointment.  Lysbeth Penner FNP

## 2013-12-22 ENCOUNTER — Telehealth: Payer: Self-pay | Admitting: Family Medicine

## 2013-12-22 NOTE — Telephone Encounter (Signed)
appt given for Friday 29 with bill

## 2013-12-24 ENCOUNTER — Ambulatory Visit (INDEPENDENT_AMBULATORY_CARE_PROVIDER_SITE_OTHER): Payer: PRIVATE HEALTH INSURANCE | Admitting: Family Medicine

## 2013-12-24 ENCOUNTER — Encounter: Payer: Self-pay | Admitting: Family Medicine

## 2013-12-24 VITALS — BP 134/85 | HR 88 | Temp 97.2°F | Ht 66.0 in | Wt 235.6 lb

## 2013-12-24 DIAGNOSIS — R5383 Other fatigue: Secondary | ICD-10-CM | POA: Diagnosis not present

## 2013-12-24 DIAGNOSIS — F411 Generalized anxiety disorder: Secondary | ICD-10-CM | POA: Diagnosis not present

## 2013-12-24 DIAGNOSIS — R5381 Other malaise: Secondary | ICD-10-CM | POA: Diagnosis not present

## 2013-12-24 DIAGNOSIS — E538 Deficiency of other specified B group vitamins: Secondary | ICD-10-CM | POA: Diagnosis not present

## 2013-12-24 DIAGNOSIS — M549 Dorsalgia, unspecified: Secondary | ICD-10-CM | POA: Diagnosis not present

## 2013-12-24 DIAGNOSIS — E559 Vitamin D deficiency, unspecified: Secondary | ICD-10-CM | POA: Diagnosis not present

## 2013-12-24 LAB — POCT CBC
Granulocyte percent: 70.6 %G (ref 37–80)
HCT, POC: 43.5 % (ref 37.7–47.9)
Hemoglobin: 13.8 g/dL (ref 12.2–16.2)
Lymph, poc: 2.5 (ref 0.6–3.4)
MCH, POC: 26.6 pg — AB (ref 27–31.2)
MCHC: 31.7 g/dL — AB (ref 31.8–35.4)
MCV: 83.7 fL (ref 80–97)
MPV: 10.6 fL (ref 0–99.8)
POC Granulocyte: 6.8 (ref 2–6.9)
POC LYMPH PERCENT: 26.2 %L (ref 10–50)
Platelet Count, POC: 252 10*3/uL (ref 142–424)
RBC: 5.2 M/uL (ref 4.04–5.48)
RDW, POC: 14.3 %
WBC: 9.6 10*3/uL (ref 4.6–10.2)

## 2013-12-24 MED ORDER — CYCLOBENZAPRINE HCL 10 MG PO TABS: 10.0000 mg | ORAL_TABLET | Freq: Three times a day (TID) | ORAL | Status: DC | PRN

## 2013-12-24 MED ORDER — ALPRAZOLAM 0.5 MG PO TABS: 0.5000 mg | ORAL_TABLET | Freq: Every evening | ORAL | Status: DC | PRN

## 2013-12-24 NOTE — Progress Notes (Signed)
Subjective:    Patient ID: Wenda M Davidow, female    DOB: 10/18/1950, 63 y.o.   MRN: [id number]  HPI This 63 y.o. female presents for evaluation of follow up on her anxiety.  She was having some Severe panic and anxiety last visit and she was advised to stop her citalopram, ADHD meds, Metoprolol.  She was rx'd xanax and she did not see psych but felt better after stopping the meds.  She sounds like she was experiencing serotonin syndrome due to SSRI's.  She is on savella and the combo of meds probably were what was causing her to panic, sweat, and have insomnia.   Review of Systems No chest pain, SOB, HA, dizziness, vision change, N/V, diarrhea, constipation, dysuria, urinary urgency or frequency, myalgias, arthralgias or rash.     Objective:   Physical Exam  Vital signs noted  Well developed well nourished female.  HEENT - Head atraumatic Normocephalic                Eyes - PERRLA, Conjuctiva - clear Sclera- Clear EOMI                Ears - EAC's Wnl TM's Wnl Gross Hearing WNL                Nose - Nares patent                 Throat - oropharanx wnl Respiratory - Lungs CTA bilateral Cardiac - RRR S1 and S2 without murmur GI - Abdomen soft Nontender and bowel sounds active x 4 Extremities - No edema. Neuro - Grossly intact.      Assessment & Plan:  Back pain - Plan: cyclobenzaprine (FLEXERIL) 10 MG tablet, POCT CBC, CMP14+EGFR, Thyroid Panel With TSH, Vit D  25 hydroxy (rtn osteoporosis monitoring), Vitamin B12  Anxiety state, unspecified - Plan: ALPRAZolam (XANAX) 0.5 MG tablet, POCT CBC, CMP14+EGFR, Thyroid Panel With TSH, Vit D  25 hydroxy (rtn osteoporosis monitoring), Vitamin B12 Improved after stopping ADHD meds and lexapro  Other malaise and fatigue - Plan: POCT CBC, CMP14+EGFR, Thyroid Panel With TSH, Vit D  25 hydroxy (rtn osteoporosis monitoring), Vitamin B12  Follow up in 3 months  William J Oxford FNP   

## 2013-12-25 LAB — THYROID PANEL WITH TSH
Free Thyroxine Index: 1.7 (ref 1.2–4.9)
T3 Uptake Ratio: 25 % (ref 24–39)
T4, Total: 6.9 ug/dL (ref 4.5–12.0)
TSH: 1.86 u[IU]/mL (ref 0.450–4.500)

## 2013-12-25 LAB — CMP14+EGFR
ALT: 26 IU/L (ref 0–32)
AST: 13 IU/L (ref 0–40)
Albumin/Globulin Ratio: 1.9 (ref 1.1–2.5)
Albumin: 4.6 g/dL (ref 3.6–4.8)
Alkaline Phosphatase: 92 IU/L (ref 39–117)
BUN/Creatinine Ratio: 14 (ref 11–26)
BUN: 10 mg/dL (ref 8–27)
CO2: 23 mmol/L (ref 18–29)
Calcium: 10 mg/dL (ref 8.7–10.3)
Chloride: 100 mmol/L (ref 97–108)
Creatinine, Ser: 0.74 mg/dL (ref 0.57–1.00)
GFR calc Af Amer: 100 mL/min/{1.73_m2} (ref >59)
GFR calc non Af Amer: 86 mL/min/{1.73_m2} (ref >59)
Globulin, Total: 2.4 g/dL (ref 1.5–4.5)
Glucose: 107 mg/dL — ABNORMAL HIGH (ref 65–99)
Potassium: 4.5 mmol/L (ref 3.5–5.2)
Sodium: 139 mmol/L (ref 134–144)
Total Bilirubin: 0.2 mg/dL (ref 0.0–1.2)
Total Protein: 7 g/dL (ref 6.0–8.5)

## 2013-12-25 LAB — VITAMIN B12: Vitamin B-12: 603 pg/mL (ref 211–946)

## 2013-12-25 LAB — VITAMIN D 25 HYDROXY (VIT D DEFICIENCY, FRACTURES): Vit D, 25-Hydroxy: 19.4 ng/mL — ABNORMAL LOW (ref 30.0–100.0)

## 2013-12-27 ENCOUNTER — Other Ambulatory Visit: Payer: Self-pay | Admitting: Family Medicine

## 2013-12-27 MED ORDER — VITAMIN D (ERGOCALCIFEROL) 1.25 MG (50000 UNIT) PO CAPS: 50000.0000 [IU] | ORAL_CAPSULE | ORAL | Status: DC

## 2013-12-28 ENCOUNTER — Ambulatory Visit: Payer: PRIVATE HEALTH INSURANCE | Admitting: Family Medicine

## 2014-02-20 ENCOUNTER — Other Ambulatory Visit: Payer: Self-pay | Admitting: Family Medicine

## 2014-02-26 ENCOUNTER — Other Ambulatory Visit: Payer: Self-pay | Admitting: Family Medicine

## 2014-02-28 ENCOUNTER — Other Ambulatory Visit: Payer: Self-pay | Admitting: Family Medicine

## 2014-02-28 NOTE — Telephone Encounter (Signed)
Last ov 5/15. Last refill 02/06/14.

## 2014-03-14 ENCOUNTER — Other Ambulatory Visit: Payer: Self-pay | Admitting: Family Medicine

## 2014-03-15 ENCOUNTER — Other Ambulatory Visit: Payer: Self-pay | Admitting: Family Medicine

## 2014-03-16 NOTE — Telephone Encounter (Signed)
Please call in xanax with 0 refills 

## 2014-03-16 NOTE — Telephone Encounter (Signed)
Called in.

## 2014-03-16 NOTE — Telephone Encounter (Signed)
Last seen 12/24/13 by Ruta Hinds, last filled 02/06/14. Route to pool, pt uses [facility]

## 2014-03-17 NOTE — Telephone Encounter (Signed)
Patient of Cynthia Leon. Notified at last refill that NTBS. Please advise on refill

## 2014-03-19 ENCOUNTER — Other Ambulatory Visit: Payer: Self-pay | Admitting: Family Medicine

## 2014-03-24 ENCOUNTER — Other Ambulatory Visit: Payer: Self-pay | Admitting: Family Medicine

## 2014-04-11 ENCOUNTER — Other Ambulatory Visit: Payer: Self-pay | Admitting: Family Medicine

## 2014-04-18 ENCOUNTER — Other Ambulatory Visit: Payer: Self-pay | Admitting: Family Medicine

## 2014-04-20 ENCOUNTER — Other Ambulatory Visit: Payer: Self-pay | Admitting: *Deleted

## 2014-04-20 ENCOUNTER — Other Ambulatory Visit: Payer: Self-pay | Admitting: Family Medicine

## 2014-04-20 NOTE — Telephone Encounter (Signed)
Last filled 03/16/14, last seen 12/24/13. Route to pool A, whether approved or not, so they can call [facility]

## 2014-04-21 ENCOUNTER — Other Ambulatory Visit: Payer: Self-pay | Admitting: Family Medicine

## 2014-04-22 ENCOUNTER — Other Ambulatory Visit: Payer: Self-pay | Admitting: Family Medicine

## 2014-04-25 NOTE — Telephone Encounter (Signed)
Patient notified at last refill that NTBS. Please advise 

## 2014-04-26 ENCOUNTER — Other Ambulatory Visit: Payer: Self-pay | Admitting: *Deleted

## 2014-04-26 NOTE — Telephone Encounter (Signed)
Patient last seen in office on 12-24-13. Rx last filled on 03-16-14 for #30. Please advise on refill. If approved please route to Pool A so nurse can phone in to pharmacy

## 2014-04-27 NOTE — Telephone Encounter (Signed)
Left message for pt to schedule follow up appt

## 2014-04-28 ENCOUNTER — Other Ambulatory Visit: Payer: Self-pay | Admitting: *Deleted

## 2014-04-28 MED ORDER — CYCLOBENZAPRINE HCL 10 MG PO TABS: ORAL_TABLET | ORAL | Status: DC

## 2014-04-28 NOTE — Telephone Encounter (Signed)
Rx filled on 04-13-14. Please advise on refill

## 2014-05-01 ENCOUNTER — Other Ambulatory Visit: Payer: Self-pay | Admitting: Family Medicine

## 2014-05-02 ENCOUNTER — Ambulatory Visit (INDEPENDENT_AMBULATORY_CARE_PROVIDER_SITE_OTHER): Payer: PRIVATE HEALTH INSURANCE | Admitting: Family

## 2014-05-02 ENCOUNTER — Encounter: Payer: Self-pay | Admitting: Family

## 2014-05-02 VITALS — BP 126/75 | HR 99 | Temp 97.1°F | Ht 66.0 in | Wt 232.0 lb

## 2014-05-02 DIAGNOSIS — M797 Fibromyalgia: Secondary | ICD-10-CM | POA: Diagnosis not present

## 2014-05-02 DIAGNOSIS — N390 Urinary tract infection, site not specified: Secondary | ICD-10-CM

## 2014-05-02 DIAGNOSIS — E785 Hyperlipidemia, unspecified: Secondary | ICD-10-CM | POA: Diagnosis not present

## 2014-05-02 DIAGNOSIS — F411 Generalized anxiety disorder: Secondary | ICD-10-CM

## 2014-05-02 DIAGNOSIS — E559 Vitamin D deficiency, unspecified: Secondary | ICD-10-CM | POA: Insufficient documentation

## 2014-05-02 DIAGNOSIS — K219 Gastro-esophageal reflux disease without esophagitis: Secondary | ICD-10-CM

## 2014-05-02 DIAGNOSIS — R300 Dysuria: Secondary | ICD-10-CM | POA: Diagnosis not present

## 2014-05-02 LAB — POCT URINALYSIS DIPSTICK
Bilirubin, UA: NEGATIVE
Glucose, UA: NEGATIVE
Ketones, UA: NEGATIVE
NITRITE UA: NEGATIVE
PROTEIN UA: NEGATIVE
RBC UA: NEGATIVE
Spec Grav, UA: <=1.005
Urobilinogen, UA: NEGATIVE
pH, UA: 6.5

## 2014-05-02 LAB — POCT UA - MICROSCOPIC ONLY
Casts, Ur, LPF, POC: NEGATIVE
Crystals, Ur, HPF, POC: NEGATIVE
MUCUS UA: NEGATIVE
YEAST UA: NEGATIVE

## 2014-05-02 MED ORDER — CYCLOBENZAPRINE HCL 10 MG PO TABS: ORAL_TABLET | ORAL | Status: DC

## 2014-05-02 MED ORDER — ALPRAZOLAM 0.5 MG PO TABS: ORAL_TABLET | ORAL | Status: DC

## 2014-05-02 MED ORDER — NITROFURANTOIN MONOHYD MACRO 100 MG PO CAPS: 100.0000 mg | ORAL_CAPSULE | Freq: Two times a day (BID) | ORAL | Status: DC

## 2014-05-02 NOTE — Patient Instructions (Signed)
Urinary Tract Infection Urinary tract infections (UTIs) can develop anywhere along your urinary tract. Your urinary tract is your body's drainage system for removing wastes and extra water. Your urinary tract includes two kidneys, two ureters, a bladder, and a urethra. Your kidneys are a pair of bean-shaped organs. Each kidney is about the size of your fist. They are located below your ribs, one on each side of your spine. CAUSES Infections are caused by microbes, which are microscopic organisms, including fungi, viruses, and bacteria. These organisms are so small that they can only be seen through a microscope. Bacteria are the microbes that most commonly cause UTIs. SYMPTOMS  Symptoms of UTIs may vary by age and gender of the patient and by the location of the infection. Symptoms in young women typically include a frequent and intense urge to urinate and a painful, burning feeling in the bladder or urethra during urination. Older women and men are more likely to be tired, shaky, and weak and have muscle aches and abdominal pain. A fever may mean the infection is in your kidneys. Other symptoms of a kidney infection include pain in your back or sides below the ribs, nausea, and vomiting. DIAGNOSIS To diagnose a UTI, your caregiver will ask you about your symptoms. Your caregiver also will ask to provide a urine sample. The urine sample will be tested for bacteria and white blood cells. White blood cells are made by your body to help fight infection. TREATMENT  Typically, UTIs can be treated with medication. Because most UTIs are caused by a bacterial infection, they usually can be treated with the use of antibiotics. The choice of antibiotic and length of treatment depend on your symptoms and the type of bacteria causing your infection. HOME CARE INSTRUCTIONS  If you were prescribed antibiotics, take them exactly as your caregiver instructs you. Finish the medication even if you feel better after you  have only taken some of the medication.  Drink enough water and fluids to keep your urine clear or pale yellow.  Avoid caffeine, tea, and carbonated beverages. They tend to irritate your bladder.  Empty your bladder often. Avoid holding urine for long periods of time.  Empty your bladder before and after sexual intercourse.  After a bowel movement, women should cleanse from front to back. Use each tissue only once. SEEK MEDICAL CARE IF:   You have back pain.  You develop a fever.  Your symptoms do not begin to resolve within 3 days. SEEK IMMEDIATE MEDICAL CARE IF:   You have severe back pain or lower abdominal pain.  You develop chills.  You have nausea or vomiting.  You have continued burning or discomfort with urination. MAKE SURE YOU:   Understand these instructions.  Will watch your condition.  Will get help right away if you are not doing well or get worse. Document Released: 04/24/2005 Document Revised: 01/14/2012 Document Reviewed: 08/23/2011 [facility] Patient Information 2015 [place], [place]. This information is not intended to replace advice given to you by your health care provider. Make sure you discuss any questions you have with your health care provider. Health Maintenance Adopting a healthy lifestyle and getting preventive care can go a long way to promote health and wellness. Talk with your health care provider about what schedule of regular examinations is right for you. This is a good chance for you to check in with your provider about disease prevention and staying healthy. In between checkups, there are plenty of things you can do on  your own. Experts have done a lot of research about which lifestyle changes and preventive measures are most likely to keep you healthy. Ask your health care provider for more information. WEIGHT AND DIET  Eat a healthy diet  Be sure to include plenty of vegetables, fruits, low-fat dairy products, and lean protein.  Do not  eat a lot of foods high in solid fats, added sugars, or salt.  Get regular exercise. This is one of the most important things you can do for your health.  Most adults should exercise for at least 150 minutes each week. The exercise should increase your heart rate and make you sweat (moderate-intensity exercise).  Most adults should also do strengthening exercises at least twice a week. This is in addition to the moderate-intensity exercise.  Maintain a healthy weight  Body mass index (BMI) is a measurement that can be used to identify possible weight problems. It estimates body fat based on height and weight. Your health care provider can help determine your BMI and help you achieve or maintain a healthy weight.  For females 41 years of age and older:   A BMI below 18.5 is considered underweight.  A BMI of 18.5 to 24.9 is normal.  A BMI of 25 to 29.9 is considered overweight.  A BMI of 30 and above is considered obese.  Watch levels of cholesterol and blood lipids  You should start having your blood tested for lipids and cholesterol at 64 years of age, then have this test every 5 years.  You may need to have your cholesterol levels checked more often if:  Your lipid or cholesterol levels are high.  You are older than 64 years of age.  You are at high risk for heart disease.  CANCER SCREENING   Lung Cancer  Lung cancer screening is recommended for adults 43-85 years old who are at high risk for lung cancer because of a history of smoking.  A yearly low-dose CT scan of the lungs is recommended for people who:  Currently smoke.  Have quit within the past 15 years.  Have at least a 30-pack-year history of smoking. A pack year is smoking an average of one pack of cigarettes a day for 1 year.  Yearly screening should continue until it has been 15 years since you quit.  Yearly screening should stop if you develop a health problem that would prevent you from having lung  cancer treatment.  Breast Cancer  Practice breast self-awareness. This means understanding how your breasts normally appear and feel.  It also means doing regular breast self-exams. Let your health care provider know about any changes, no matter how small.  If you are in your 20s or 30s, you should have a clinical breast exam (CBE) by a health care provider every 1-3 years as part of a regular health exam.  If you are 17 or older, have a CBE every year. Also consider having a breast X-ray (mammogram) every year.  If you have a family history of breast cancer, talk to your health care provider about genetic screening.  If you are at high risk for breast cancer, talk to your health care provider about having an MRI and a mammogram every year.  Breast cancer gene (BRCA) assessment is recommended for women who have family members with BRCA-related cancers. BRCA-related cancers include:  Breast.  Ovarian.  Tubal.  Peritoneal cancers.  Results of the assessment will determine the need for genetic counseling and BRCA1 and BRCA2  testing. Cervical Cancer Routine pelvic examinations to screen for cervical cancer are no longer recommended for nonpregnant women who are considered low risk for cancer of the pelvic organs (ovaries, uterus, and vagina) and who do not have symptoms. A pelvic examination may be necessary if you have symptoms including those associated with pelvic infections. Ask your health care provider if a screening pelvic exam is right for you.   The Pap test is the screening test for cervical cancer for women who are considered at risk.  If you had a hysterectomy for a problem that was not cancer or a condition that could lead to cancer, then you no longer need Pap tests.  If you are older than 65 years, and you have had normal Pap tests for the past 10 years, you no longer need to have Pap tests.  If you have had past treatment for cervical cancer or a condition that could  lead to cancer, you need Pap tests and screening for cancer for at least 20 years after your treatment.  If you no longer get a Pap test, assess your risk factors if they change (such as having a new sexual partner). This can affect whether you should start being screened again.  Some women have medical problems that increase their chance of getting cervical cancer. If this is the case for you, your health care provider may recommend more frequent screening and Pap tests.  The human papillomavirus (HPV) test is another test that may be used for cervical cancer screening. The HPV test looks for the virus that can cause cell changes in the cervix. The cells collected during the Pap test can be tested for HPV.  The HPV test can be used to screen women 54 years of age and older. Getting tested for HPV can extend the interval between normal Pap tests from three to five years.  An HPV test also should be used to screen women of any age who have unclear Pap test results.  After 64 years of age, women should have HPV testing as often as Pap tests.  Colorectal Cancer  This type of cancer can be detected and often prevented.  Routine colorectal cancer screening usually begins at 64 years of age and continues through 64 years of age.  Your health care provider may recommend screening at an earlier age if you have risk factors for colon cancer.  Your health care provider may also recommend using home test kits to check for hidden blood in the stool.  A small camera at the end of a tube can be used to examine your colon directly (sigmoidoscopy or colonoscopy). This is done to check for the earliest forms of colorectal cancer.  Routine screening usually begins at age 36.  Direct examination of the colon should be repeated every 5-10 years through 64 years of age. However, you may need to be screened more often if early forms of precancerous polyps or small growths are found. Skin Cancer  Check your  skin from head to toe regularly.  Tell your health care provider about any new moles or changes in moles, especially if there is a change in a mole's shape or color.  Also tell your health care provider if you have a mole that is larger than the size of a pencil eraser.  Always use sunscreen. Apply sunscreen liberally and repeatedly throughout the day.  Protect yourself by wearing long sleeves, pants, a wide-brimmed hat, and sunglasses whenever you are outside. HEART DISEASE,  DIABETES, AND HIGH BLOOD PRESSURE   Have your blood pressure checked at least every 1-2 years. High blood pressure causes heart disease and increases the risk of stroke.  If you are between 25 years and 42 years old, ask your health care provider if you should take aspirin to prevent strokes.  Have regular diabetes screenings. This involves taking a blood sample to check your fasting blood sugar level.  If you are at a normal weight and have a low risk for diabetes, have this test once every three years after 64 years of age.  If you are overweight and have a high risk for diabetes, consider being tested at a younger age or more often. PREVENTING INFECTION  Hepatitis B  If you have a higher risk for hepatitis B, you should be screened for this virus. You are considered at high risk for hepatitis B if:  You were born in a country where hepatitis B is common. Ask your health care provider which countries are considered high risk.  Your parents were born in a high-risk country, and you have not been immunized against hepatitis B (hepatitis B vaccine).  You have HIV or AIDS.  You use needles to inject street drugs.  You live with someone who has hepatitis B.  You have had sex with someone who has hepatitis B.  You get hemodialysis treatment.  You take certain medicines for conditions, including cancer, organ transplantation, and autoimmune conditions. Hepatitis C  Blood testing is recommended  for:  Everyone born from [birth year] through 1966.  Anyone with known risk factors for hepatitis C. Sexually transmitted infections (STIs)  You should be screened for sexually transmitted infections (STIs) including gonorrhea and chlamydia if:  You are sexually active and are younger than 64 years of age.  You are older than 64 years of age and your health care provider tells you that you are at risk for this type of infection.  Your sexual activity has changed since you were last screened and you are at an increased risk for chlamydia or gonorrhea. Ask your health care provider if you are at risk.  If you do not have HIV, but are at risk, it may be recommended that you take a prescription medicine daily to prevent HIV infection. This is called pre-exposure prophylaxis (PrEP). You are considered at risk if:  You are sexually active and do not regularly use condoms or know the HIV status of your partner(s).  You take drugs by injection.  You are sexually active with a partner who has HIV. Talk with your health care provider about whether you are at high risk of being infected with HIV. If you choose to begin PrEP, you should first be tested for HIV. You should then be tested every 3 months for as long as you are taking PrEP.  PREGNANCY   If you are premenopausal and you may become pregnant, ask your health care provider about preconception counseling.  If you may become pregnant, take 400 to 800 micrograms (mcg) of folic acid every day.  If you want to prevent pregnancy, talk to your health care provider about birth control (contraception). OSTEOPOROSIS AND MENOPAUSE   Osteoporosis is a disease in which the bones lose minerals and strength with aging. This can result in serious bone fractures. Your risk for osteoporosis can be identified using a bone density scan.  If you are 50 years of age or older, or if you are at risk for osteoporosis and fractures, ask your  health care provider if you  should be screened.  Ask your health care provider whether you should take a calcium or vitamin D supplement to lower your risk for osteoporosis.  Menopause may have certain physical symptoms and risks.  Hormone replacement therapy may reduce some of these symptoms and risks. Talk to your health care provider about whether hormone replacement therapy is right for you.  HOME CARE INSTRUCTIONS   Schedule regular health, dental, and eye exams.  Stay current with your immunizations.   Do not use any tobacco products including cigarettes, chewing tobacco, or electronic cigarettes.  If you are pregnant, do not drink alcohol.  If you are breastfeeding, limit how much and how often you drink alcohol.  Limit alcohol intake to no more than 1 drink per day for nonpregnant women. One drink equals 12 ounces of beer, 5 ounces of wine, or 1 ounces of hard liquor.  Do not use street drugs.  Do not share needles.  Ask your health care provider for help if you need support or information about quitting drugs.  Tell your health care provider if you often feel depressed.  Tell your health care provider if you have ever been abused or do not feel safe at home. Document Released: 01/28/2011 Document Revised: 11/29/2013 Document Reviewed: 06/16/2013 [facility] Patient Information 2015 [place], [place]. This information is not intended to replace advice given to you by your health care provider. Make sure you discuss any questions you have with your health care provider.

## 2014-05-02 NOTE — Progress Notes (Signed)
Subjective:    Patient ID: Cynthia Leon, female    DOB: Aug 07, 1950, 63 y.o.   MRN: [id number]  Urinary Tract Infection  This is a chronic problem. The current episode started 1 to 4 weeks ago ("two weeks ago"). The problem occurs every urination. The problem has been unchanged. The quality of the pain is described as burning. The pain is at a severity of 7/10. There has been no fever. Associated symptoms include frequency, hesitancy, nausea and urgency. Pertinent negatives include no chills, discharge, flank pain, hematuria or vomiting. She has tried increased fluids for the symptoms. The treatment provided mild relief.  Hyperlipidemia This is a chronic problem. The current episode started more than 1 year ago. The problem is uncontrolled. Recent lipid tests were reviewed and are high. She has no history of hypothyroidism or liver disease. Pertinent negatives include no leg pain, myalgias or shortness of breath. Current antihyperlipidemic treatment includes statins. The current treatment provides moderate improvement of lipids. Risk factors for coronary artery disease include dyslipidemia, hypertension, female sex, a sedentary lifestyle, post-menopausal and family history.  Anxiety Presents for follow-up visit. Symptoms include excessive worry, irritability, nausea and nervous/anxious behavior. Patient reports no depressed mood, palpitations, panic or shortness of breath. Symptoms occur constantly. The symptoms are aggravated by social activities.   Her past medical history is significant for anxiety/panic attacks. There is no history of depression. Past treatments include benzodiazephines. The treatment provided moderate relief. Compliance with prior treatments has been good (Pt states she hasn't meds in last couple of weeks).  Gastrophageal Reflux She complains of nausea. She reports no belching, no coughing, no heartburn, no sore throat or no stridor. This is a chronic problem. The current  episode started more than 1 year ago. The problem occurs rarely. The problem has been resolved. The symptoms are aggravated by certain foods. Pertinent negatives include no muscle weakness. She has tried a PPI for the symptoms. The treatment provided moderate relief.      Review of Systems  Constitutional: Positive for irritability. Negative for chills.  HENT: Negative.  Negative for sore throat.   Eyes: Negative.   Respiratory: Negative.  Negative for cough and shortness of breath.   Cardiovascular: Negative.  Negative for palpitations.  Gastrointestinal: Positive for nausea. Negative for heartburn and vomiting.  Endocrine: Negative.   Genitourinary: Positive for hesitancy, urgency and frequency. Negative for hematuria and flank pain.  Musculoskeletal: Negative.  Negative for muscle weakness and myalgias.  Neurological: Negative.  Negative for headaches.  Hematological: Negative.   Psychiatric/Behavioral: The patient is nervous/anxious.   All other systems reviewed and are negative.      Objective:   Physical Exam  Vitals reviewed. Constitutional: She is oriented to person, place, and time. She appears well-developed and well-nourished. No distress.  HENT:  Head: Normocephalic and atraumatic.  Right Ear: External ear normal.  Left Ear: External ear normal.  Nose: Nose normal.  Mouth/Throat: Oropharynx is clear and moist.  Eyes: Pupils are equal, round, and reactive to light.  Neck: Normal range of motion. Neck supple. No thyromegaly present.  Cardiovascular: Normal rate, regular rhythm, normal heart sounds and intact distal pulses.   No murmur heard. Pulmonary/Chest: Effort normal and breath sounds normal. No respiratory distress. She has no wheezes.  Abdominal: Soft. Bowel sounds are normal. She exhibits no distension. There is no tenderness.  Musculoskeletal: Normal range of motion. She exhibits no edema and no tenderness.  Negative for CVA tenderness   Neurological: She  is  alert and oriented to person, place, and time. She has normal reflexes. No cranial nerve deficit.  Skin: Skin is warm and dry.  Psychiatric: She has a normal mood and affect. Her behavior is normal. Judgment and thought content normal.      BP 126/75  Pulse 99  Temp(Src) 97.1 F (36.2 C) (Oral)  Ht _0  (1.676 m)  Wt 232 lb (105.235 kg)  BMI 37.46 kg/m2     Assessment & Plan:  1. Gastroesophageal reflux disease, esophagitis presence not specified - CMP14+EGFR  2. Hyperlipidemia - CMP14+EGFR - Lipid panel  3. Burning with urination - POCT urinalysis dipstick - POCT UA - Microscopic Only - CMP14+EGFR  4. GAD (generalized anxiety disorder) - CMP14+EGFR - ALPRAZolam (XANAX) 0.5 MG tablet; TAKE ONE TABLET BY MOUTH AT BEDTIME AS NEEDED FOR ANXIETY  Dispense: 30 tablet; Refill: 1  5. Vitamin D deficiency - CMP14+EGFR - Vit D  25 hydroxy (rtn osteoporosis monitoring)  6. Fibromyalgia - cyclobenzaprine (FLEXERIL) 10 MG tablet; TAKE ONE TABLET BY MOUTH THREE TIMES DAILY AS NEEDED FOR MUSCLE SPASM  Dispense: 30 tablet; Refill: 1  7. UTI (lower urinary tract infection) - nitrofurantoin, macrocrystal-monohydrate, (MACROBID) 100 MG capsule; Take 1 capsule (100 mg total) by mouth 2 (two) times daily.  Dispense: 10 capsule; Refill: 0   Continue all meds Labs pending Health Maintenance reviewed-hemoccult cards given to patient with directions Diet and exercise encouraged RTO 3 for follow-up   Evelina Dun, FNP

## 2014-05-03 ENCOUNTER — Other Ambulatory Visit: Payer: Self-pay | Admitting: Family

## 2014-05-03 DIAGNOSIS — E781 Pure hyperglyceridemia: Secondary | ICD-10-CM

## 2014-05-03 LAB — LIPID PANEL
CHOLESTEROL TOTAL: 185 mg/dL (ref 100–199)
Chol/HDL Ratio: 4.4 ratio units (ref 0.0–4.4)
HDL: 42 mg/dL (ref >39)
LDL Calculated: 92 mg/dL (ref 0–99)
TRIGLYCERIDES: 255 mg/dL — AB (ref 0–149)
VLDL Cholesterol Cal: 51 mg/dL — ABNORMAL HIGH (ref 5–40)

## 2014-05-03 LAB — CMP14+EGFR
ALK PHOS: 102 IU/L (ref 39–117)
ALT: 17 IU/L (ref 0–32)
AST: 13 IU/L (ref 0–40)
Albumin/Globulin Ratio: 2.2 (ref 1.1–2.5)
Albumin: 4.6 g/dL (ref 3.6–4.8)
BUN/Creatinine Ratio: 14 (ref 11–26)
BUN: 11 mg/dL (ref 8–27)
CO2: 24 mmol/L (ref 18–29)
Calcium: 10.1 mg/dL (ref 8.7–10.3)
Chloride: 99 mmol/L (ref 97–108)
Creatinine, Ser: 0.8 mg/dL (ref 0.57–1.00)
GFR calc Af Amer: 91 mL/min/{1.73_m2} (ref >59)
GFR calc non Af Amer: 79 mL/min/{1.73_m2} (ref >59)
GLOBULIN, TOTAL: 2.1 g/dL (ref 1.5–4.5)
Glucose: 132 mg/dL — ABNORMAL HIGH (ref 65–99)
Potassium: 4.7 mmol/L (ref 3.5–5.2)
SODIUM: 138 mmol/L (ref 134–144)
Total Bilirubin: 0.2 mg/dL (ref 0.0–1.2)
Total Protein: 6.7 g/dL (ref 6.0–8.5)

## 2014-05-03 LAB — VITAMIN D 25 HYDROXY (VIT D DEFICIENCY, FRACTURES): Vit D, 25-Hydroxy: 22.1 ng/mL — ABNORMAL LOW (ref 30.0–100.0)

## 2014-05-03 MED ORDER — ICOSAPENT ETHYL 1 G PO CAPS: 2.0000 | ORAL_CAPSULE | Freq: Two times a day (BID) | ORAL | Status: DC

## 2014-05-18 ENCOUNTER — Other Ambulatory Visit: Payer: Self-pay | Admitting: Family Medicine

## 2014-05-21 ENCOUNTER — Other Ambulatory Visit: Payer: Self-pay | Admitting: Family Medicine

## 2014-06-13 ENCOUNTER — Encounter: Payer: Self-pay | Admitting: *Deleted

## 2014-06-26 ENCOUNTER — Other Ambulatory Visit: Payer: Self-pay | Admitting: Family

## 2014-07-03 ENCOUNTER — Other Ambulatory Visit: Payer: Self-pay | Admitting: Family

## 2014-07-04 NOTE — Telephone Encounter (Signed)
Please review and advise.

## 2014-07-04 NOTE — Telephone Encounter (Signed)
Last seen 05/02/14  Cynthia Leon  IF approved route to nurse to call into [facility]

## 2014-07-18 ENCOUNTER — Other Ambulatory Visit: Payer: Self-pay | Admitting: Family Medicine

## 2014-07-18 ENCOUNTER — Telehealth: Payer: Self-pay | Admitting: *Deleted

## 2014-07-18 ENCOUNTER — Other Ambulatory Visit: Payer: Self-pay | Admitting: *Deleted

## 2014-07-18 MED ORDER — CYCLOBENZAPRINE HCL 10 MG PO TABS: 10.0000 mg | ORAL_TABLET | Freq: Three times a day (TID) | ORAL | Status: DC | PRN

## 2014-07-18 NOTE — Telephone Encounter (Signed)
Pt requesting refill on her flexeril10mg  1po tid prn for muscle spasms, last seen 05/02/14, next appt 08/05/14, last ordered 06/27/14 #30 no refills given. If approved rx will go to [facility] in [place].

## 2014-07-18 NOTE — Telephone Encounter (Signed)
Script for flexeril sent to pharmacy

## 2014-07-23 ENCOUNTER — Other Ambulatory Visit: Payer: Self-pay | Admitting: Family

## 2014-08-01 ENCOUNTER — Ambulatory Visit: Payer: Medicare Other | Admitting: Family Medicine

## 2014-08-02 ENCOUNTER — Ambulatory Visit (INDEPENDENT_AMBULATORY_CARE_PROVIDER_SITE_OTHER): Payer: PRIVATE HEALTH INSURANCE | Admitting: Family Medicine

## 2014-08-02 ENCOUNTER — Encounter: Payer: Self-pay | Admitting: Family Medicine

## 2014-08-02 VITALS — BP 136/75 | HR 92 | Temp 97.3°F | Ht 66.0 in | Wt 237.2 lb

## 2014-08-02 DIAGNOSIS — E785 Hyperlipidemia, unspecified: Secondary | ICD-10-CM | POA: Diagnosis not present

## 2014-08-02 DIAGNOSIS — E559 Vitamin D deficiency, unspecified: Secondary | ICD-10-CM

## 2014-08-02 DIAGNOSIS — K219 Gastro-esophageal reflux disease without esophagitis: Secondary | ICD-10-CM

## 2014-08-02 DIAGNOSIS — F411 Generalized anxiety disorder: Secondary | ICD-10-CM | POA: Diagnosis not present

## 2014-08-02 MED ORDER — CYCLOBENZAPRINE HCL 10 MG PO TABS: 10.0000 mg | ORAL_TABLET | Freq: Three times a day (TID) | ORAL | Status: DC | PRN

## 2014-08-02 NOTE — Progress Notes (Signed)
   Subjective:    Patient ID: Cynthia Leon, female    DOB: 15-Jan-1951, 64 y.o.   MRN: [id number]  HPI 64 year old female here for routine follow-up. She is followed for hyperlipidemia, anxiety, fibromyalgia, GERD,. Generally, she is doing well. She takes alprazolam only at night for anxiety and to help her relax. Lipids were checked at her last visit 3 months ago and LDL cholesterol is at goal. She was started on medication for triglycerides but I do not think that this needs to be treated seborrheic going to discontinue the med there. She is also interested in discontinuing some other medicines so we discussed tapering the [place],     Review of Systems  Constitutional: Positive for unexpected weight change.  HENT: Negative.   Respiratory: Negative.   Cardiovascular: Negative.   Gastrointestinal: Negative.   Endocrine: Negative.   Genitourinary: Negative.   Psychiatric/Behavioral: Negative.        Objective:   Physical Exam  Constitutional: She is oriented to person, place, and time. She appears well-developed and well-nourished.  Eyes: Conjunctivae and EOM are normal.  Neck: Normal range of motion. Neck supple.  Cardiovascular: Normal rate, regular rhythm and normal heart sounds.   Pulmonary/Chest: Effort normal and breath sounds normal.  Abdominal: Soft. Bowel sounds are normal.  Musculoskeletal: Normal range of motion.  Neurological: She is alert and oriented to person, place, and time. She has normal reflexes.  Skin: Skin is warm and dry.  Psychiatric: She has a normal mood and affect. Her behavior is normal. Thought content normal.   BP 136/75 mmHg  Pulse 92  Temp(Src) 97.3 F (36.3 C) (Oral)  Ht 5\' 6"  (1.676 m)  Wt 237 lb 3.2 oz (107.593 kg)  BMI 38.30 kg/m2        Assessment & Plan:  1. Hyperlipidemia LDL at goal; continue Crestor but D/C Vascepa  2. GAD (generalized anxiety disorder) Cont alprazolam  3. Vitamin D deficiency Cont Vit D; takes 4000  units/day  Wardell Honour MD  4. Gastroesophageal reflux disease, esophagitis presence not specified

## 2014-08-03 ENCOUNTER — Ambulatory Visit: Payer: Medicare Other | Admitting: Family Medicine

## 2014-08-05 ENCOUNTER — Ambulatory Visit: Payer: Medicare Other | Admitting: Family Medicine

## 2014-08-14 ENCOUNTER — Other Ambulatory Visit: Payer: Self-pay | Admitting: Family Medicine

## 2014-08-15 NOTE — Telephone Encounter (Signed)
Last filled on 07/04/14. If approved route to nurse to call into [facility].

## 2014-08-16 ENCOUNTER — Other Ambulatory Visit: Payer: Self-pay | Admitting: Family Medicine

## 2014-08-20 NOTE — Telephone Encounter (Signed)
Left authorization on [employer]

## 2014-08-23 ENCOUNTER — Other Ambulatory Visit: Payer: Self-pay | Admitting: Family Medicine

## 2014-09-02 ENCOUNTER — Ambulatory Visit (INDEPENDENT_AMBULATORY_CARE_PROVIDER_SITE_OTHER): Payer: PRIVATE HEALTH INSURANCE | Admitting: Family

## 2014-09-02 ENCOUNTER — Encounter: Payer: Self-pay | Admitting: Family

## 2014-09-02 VITALS — Ht 66.0 in | Wt 236.6 lb

## 2014-09-02 DIAGNOSIS — Z0000 Encounter for general adult medical examination without abnormal findings: Secondary | ICD-10-CM

## 2014-09-02 DIAGNOSIS — E785 Hyperlipidemia, unspecified: Secondary | ICD-10-CM

## 2014-09-02 DIAGNOSIS — M545 Low back pain: Secondary | ICD-10-CM

## 2014-09-02 DIAGNOSIS — Z01419 Encounter for gynecological examination (general) (routine) without abnormal findings: Secondary | ICD-10-CM

## 2014-09-02 DIAGNOSIS — F411 Generalized anxiety disorder: Secondary | ICD-10-CM

## 2014-09-02 DIAGNOSIS — K219 Gastro-esophageal reflux disease without esophagitis: Secondary | ICD-10-CM

## 2014-09-02 DIAGNOSIS — E559 Vitamin D deficiency, unspecified: Secondary | ICD-10-CM

## 2014-09-02 LAB — POCT URINALYSIS DIPSTICK
Bilirubin, UA: NEGATIVE
GLUCOSE UA: NEGATIVE
Ketones, UA: NEGATIVE
Nitrite, UA: NEGATIVE
Protein, UA: NEGATIVE
SPEC GRAV UA: 1.01
Urobilinogen, UA: NEGATIVE
pH, UA: 6.5

## 2014-09-02 LAB — POCT UA - MICROSCOPIC ONLY
Bacteria, U Microscopic: NEGATIVE
CASTS, UR, LPF, POC: NEGATIVE
Crystals, Ur, HPF, POC: NEGATIVE
MUCUS UA: NEGATIVE
YEAST UA: NEGATIVE

## 2014-09-02 MED ORDER — TRAMADOL HCL 50 MG PO TABS: 50.0000 mg | ORAL_TABLET | Freq: Three times a day (TID) | ORAL | Status: DC | PRN

## 2014-09-02 MED ORDER — OMEPRAZOLE-SODIUM BICARBONATE 40-1100 MG PO CAPS: 1.0000 | ORAL_CAPSULE | Freq: Two times a day (BID) | ORAL | Status: DC

## 2014-09-02 MED ORDER — CYCLOBENZAPRINE HCL 10 MG PO TABS: 10.0000 mg | ORAL_TABLET | Freq: Three times a day (TID) | ORAL | Status: DC | PRN

## 2014-09-02 MED ORDER — ALPRAZOLAM 0.5 MG PO TABS: ORAL_TABLET | ORAL | Status: DC

## 2014-09-02 NOTE — Addendum Note (Signed)
Addended by: Earlene Plater on: 09/02/2014 11:34 AM   Modules accepted: Miquel Dunn

## 2014-09-02 NOTE — Progress Notes (Signed)
Subjective:    Patient ID: Cynthia Leon, female    DOB: June 13, 1951, 64 y.o.   MRN: [id number]  Pt presents to the office for CPE with pap.  Gynecologic Exam Pertinent negatives include no headaches or sore throat.  Hyperlipidemia This is a chronic problem. The current episode started more than 1 year ago. The problem is controlled. Recent lipid tests were reviewed and are normal. Exacerbating diseases include obesity. She has no history of diabetes or hypothyroidism. Pertinent negatives include no leg pain, myalgias or shortness of breath. Current antihyperlipidemic treatment includes statins. The current treatment provides significant improvement of lipids. Risk factors for coronary artery disease include dyslipidemia, family history, post-menopausal and a sedentary lifestyle.  Anxiety Presents for follow-up visit. Patient reports no decreased concentration, hyperventilation, muscle tension, nervous/anxious behavior, palpitations or shortness of breath. Symptoms occur rarely. The severity of symptoms is mild.   Her past medical history is significant for anxiety/panic attacks. There is no history of depression. Past treatments include benzodiazephines. Compliance with prior treatments has been good.  Gastrophageal Reflux She reports no belching, no coughing, no heartburn, no sore throat or no water brash. This is a chronic problem. The current episode started more than 1 year ago. The problem occurs rarely. The problem has been resolved. The symptoms are aggravated by certain foods. Risk factors include obesity. She has tried a PPI for the symptoms. The treatment provided significant relief.      Review of Systems  Constitutional: Negative.   HENT: Negative.  Negative for sore throat.   Eyes: Negative.   Respiratory: Negative.  Negative for cough and shortness of breath.   Cardiovascular: Negative.  Negative for palpitations.  Gastrointestinal: Negative.  Negative for heartburn.    Endocrine: Negative.   Genitourinary: Negative.   Musculoskeletal: Negative.  Negative for myalgias.  Neurological: Negative.  Negative for headaches.  Hematological: Negative.   Psychiatric/Behavioral: Negative.  Negative for decreased concentration. The patient is not nervous/anxious.   All other systems reviewed and are negative.      Objective:   Physical Exam  Constitutional: She is oriented to person, place, and time. She appears well-developed and well-nourished. No distress.  HENT:  Head: Normocephalic and atraumatic.  Right Ear: External ear normal.  Mouth/Throat: Oropharynx is clear and moist.  Eyes: Pupils are equal, round, and reactive to light.  Neck: Normal range of motion. Neck supple. No thyromegaly present.  Cardiovascular: Normal rate, regular rhythm, normal heart sounds and intact distal pulses.   No murmur heard. Pulmonary/Chest: Effort normal and breath sounds normal. No respiratory distress. She has no wheezes. Right breast exhibits no inverted nipple, no mass, no nipple discharge, no skin change and no tenderness. Left breast exhibits no inverted nipple, no mass, no nipple discharge, no skin change and no tenderness. Breasts are symmetrical.  Abdominal: Soft. Bowel sounds are normal. She exhibits no distension. There is no tenderness.  Genitourinary: Vagina normal.  Bimanual exam- no adnexal masses or tenderness, ovaries nonpalpable   Cervix parous and pink- No discharge   Musculoskeletal: Normal range of motion. She exhibits no edema or tenderness.  Neurological: She is alert and oriented to person, place, and time. She has normal reflexes. No cranial nerve deficit.  Skin: Skin is warm and dry.  Psychiatric: She has a normal mood and affect. Her behavior is normal. Judgment and thought content normal.  Vitals reviewed.     Ht '5\' 6"'  (1.676 m)  Wt 236 lb 9.6 oz (107.321 kg)  BMI  38.21 kg/m2     Assessment & Plan:  1. Gastroesophageal reflux disease,  esophagitis presence not specified - CMP14+EGFR - omeprazole-sodium bicarbonate (ZEGERID) 40-1100 MG per capsule; Take 1 capsule by mouth 2 (two) times daily.  Dispense: 180 capsule; Refill: 4  2. Vitamin D deficiency - CMP14+EGFR - Vit D  25 hydroxy (rtn osteoporosis monitoring)  3. Hyperlipidemia - CMP14+EGFR - Lipid panel  4. GAD (generalized anxiety disorder) - CMP14+EGFR - ALPRAZolam (XANAX) 0.5 MG tablet; TAKE ONE TABLET BY MOUTH AT BEDTIME AS NEEDED FOR ANXIETY  Dispense: 30 tablet; Refill: 3  5. Midline low back pain, with sciatica presence unspecified - cyclobenzaprine (FLEXERIL) 10 MG tablet; Take 1 tablet (10 mg total) by mouth 3 (three) times daily as needed. for muscle spams  Dispense: 30 tablet; Refill: 1 - CMP14+EGFR  6. Annual physical exam  - CMP14+EGFR - Lipid panel - Vit D  25 hydroxy (rtn osteoporosis monitoring) - Thyroid Panel With TSH - Pap IG w/ reflex to HPV when [facility]  7. Encounter for routine gynecological examination  - Pap IG w/ reflex to HPV when [facility]   Continue all meds Labs pending Health Maintenance reviewed-hemoccult cards given to patient with directions  Diet and exercise encouraged RTO 6 months  Evelina Dun, FNP

## 2014-09-02 NOTE — Patient Instructions (Signed)

## 2014-09-03 LAB — CMP14+EGFR
ALBUMIN: 4.6 g/dL (ref 3.6–4.8)
ALK PHOS: 104 IU/L (ref 39–117)
ALT: 23 IU/L (ref 0–32)
AST: 15 IU/L (ref 0–40)
Albumin/Globulin Ratio: 2 (ref 1.1–2.5)
BUN/Creatinine Ratio: 11 (ref 11–26)
BUN: 9 mg/dL (ref 8–27)
CHLORIDE: 101 mmol/L (ref 97–108)
CO2: 24 mmol/L (ref 18–29)
Calcium: 10.3 mg/dL (ref 8.7–10.3)
Creatinine, Ser: 0.79 mg/dL (ref 0.57–1.00)
GFR calc Af Amer: 91 mL/min/{1.73_m2} (ref >59)
GFR calc non Af Amer: 79 mL/min/{1.73_m2} (ref >59)
Globulin, Total: 2.3 g/dL (ref 1.5–4.5)
Glucose: 87 mg/dL (ref 65–99)
Potassium: 4.4 mmol/L (ref 3.5–5.2)
Sodium: 140 mmol/L (ref 134–144)
Total Protein: 6.9 g/dL (ref 6.0–8.5)

## 2014-09-03 LAB — LIPID PANEL
CHOLESTEROL TOTAL: 172 mg/dL (ref 100–199)
Chol/HDL Ratio: 4 ratio units (ref 0.0–4.4)
HDL: 43 mg/dL (ref >39)
LDL Calculated: 75 mg/dL (ref 0–99)
Triglycerides: 271 mg/dL — ABNORMAL HIGH (ref 0–149)
VLDL CHOLESTEROL CAL: 54 mg/dL — AB (ref 5–40)

## 2014-09-03 LAB — VITAMIN D 25 HYDROXY (VIT D DEFICIENCY, FRACTURES): Vit D, 25-Hydroxy: 19.7 ng/mL — ABNORMAL LOW (ref 30.0–100.0)

## 2014-09-03 LAB — THYROID PANEL WITH TSH
Free Thyroxine Index: 1.3 (ref 1.2–4.9)
T3 Uptake Ratio: 24 % (ref 24–39)
T4, Total: 5.4 ug/dL (ref 4.5–12.0)
TSH: 2.79 u[IU]/mL (ref 0.450–4.500)

## 2014-09-06 LAB — PAP IG W/ RFLX HPV ASCU: PAP Smear Comment: 0

## 2014-09-07 ENCOUNTER — Other Ambulatory Visit: Payer: Self-pay | Admitting: Family

## 2014-09-07 MED ORDER — SULFAMETHOXAZOLE-TRIMETHOPRIM 800-160 MG PO TABS: 1.0000 | ORAL_TABLET | Freq: Two times a day (BID) | ORAL | Status: DC

## 2014-09-07 MED ORDER — VITAMIN D (ERGOCALCIFEROL) 1.25 MG (50000 UNIT) PO CAPS: 50000.0000 [IU] | ORAL_CAPSULE | ORAL | Status: DC

## 2014-09-10 ENCOUNTER — Other Ambulatory Visit: Payer: Self-pay | Admitting: Family Medicine

## 2014-09-28 ENCOUNTER — Other Ambulatory Visit: Payer: Self-pay | Admitting: Family

## 2014-10-08 ENCOUNTER — Other Ambulatory Visit: Payer: Self-pay | Admitting: Family Medicine

## 2014-11-01 ENCOUNTER — Other Ambulatory Visit: Payer: Self-pay | Admitting: Family

## 2014-11-04 ENCOUNTER — Other Ambulatory Visit: Payer: Self-pay | Admitting: Family

## 2014-11-16 ENCOUNTER — Other Ambulatory Visit: Payer: Self-pay | Admitting: Family Medicine

## 2014-12-09 ENCOUNTER — Other Ambulatory Visit: Payer: Self-pay | Admitting: Family

## 2014-12-16 ENCOUNTER — Other Ambulatory Visit: Payer: Self-pay | Admitting: Family

## 2015-01-02 ENCOUNTER — Other Ambulatory Visit: Payer: Self-pay | Admitting: Family

## 2015-01-03 NOTE — Telephone Encounter (Signed)
RX ready for pick up 

## 2015-01-03 NOTE — Telephone Encounter (Signed)
Aware,script for pain medication ready. 

## 2015-01-03 NOTE — Telephone Encounter (Signed)
Last seen 09/02/14 Cynthia Leon  If approved print

## 2015-01-13 ENCOUNTER — Other Ambulatory Visit: Payer: Self-pay | Admitting: Family

## 2015-01-13 NOTE — Telephone Encounter (Signed)
RF called to [facility]

## 2015-01-13 NOTE — Telephone Encounter (Signed)
Last seen 09/02/14 Cynthia Leon  If approved route to nurse to call into [facility]

## 2015-01-20 ENCOUNTER — Other Ambulatory Visit: Payer: Self-pay | Admitting: Nurse Practitioner

## 2015-02-08 ENCOUNTER — Other Ambulatory Visit: Payer: Self-pay | Admitting: Family

## 2015-02-09 NOTE — Telephone Encounter (Signed)
Last seen 09/02/14 Cynthia Leon  If approved route to nurse to call into [facility]

## 2015-02-10 NOTE — Telephone Encounter (Signed)
rx called into pharmacy

## 2015-02-19 ENCOUNTER — Other Ambulatory Visit: Payer: Self-pay | Admitting: Family

## 2015-02-22 ENCOUNTER — Encounter (INDEPENDENT_AMBULATORY_CARE_PROVIDER_SITE_OTHER): Payer: Self-pay

## 2015-02-22 ENCOUNTER — Encounter: Payer: Self-pay | Admitting: Family

## 2015-02-22 ENCOUNTER — Ambulatory Visit (INDEPENDENT_AMBULATORY_CARE_PROVIDER_SITE_OTHER): Payer: PRIVATE HEALTH INSURANCE | Admitting: Family

## 2015-02-22 VITALS — BP 130/82 | HR 91 | Temp 97.2°F | Ht 66.0 in | Wt 236.0 lb

## 2015-02-22 DIAGNOSIS — J0100 Acute maxillary sinusitis, unspecified: Secondary | ICD-10-CM

## 2015-02-22 MED ORDER — AMOXICILLIN-POT CLAVULANATE 875-125 MG PO TABS: 1.0000 | ORAL_TABLET | Freq: Two times a day (BID) | ORAL | Status: DC

## 2015-02-22 MED ORDER — FLUTICASONE PROPIONATE 50 MCG/ACT NA SUSP: 2.0000 | Freq: Every day | NASAL | Status: DC

## 2015-02-22 NOTE — Patient Instructions (Signed)
Sinusitis Sinusitis is redness, soreness, and inflammation of the paranasal sinuses. Paranasal sinuses are air pockets within the bones of your face (beneath the eyes, the middle of the forehead, or above the eyes). In healthy paranasal sinuses, mucus is able to drain out, and air is able to circulate through them by way of your nose. However, when your paranasal sinuses are inflamed, mucus and air can become trapped. This can allow bacteria and other germs to grow and cause infection. Sinusitis can develop quickly and last only a short time (acute) or continue over a long period (chronic). Sinusitis that lasts for more than 12 weeks is considered chronic.  CAUSES  Causes of sinusitis include:  Allergies.  Structural abnormalities, such as displacement of the cartilage that separates your nostrils (deviated septum), which can decrease the air flow through your nose and sinuses and affect sinus drainage.  Functional abnormalities, such as when the small hairs (cilia) that line your sinuses and help remove mucus do not work properly or are not present. SIGNS AND SYMPTOMS  Symptoms of acute and chronic sinusitis are the same. The primary symptoms are pain and pressure around the affected sinuses. Other symptoms include:  Upper toothache.  Earache.  Headache.  Bad breath.  Decreased sense of smell and taste.  A cough, which worsens when you are lying flat.  Fatigue.  Fever.  Thick drainage from your nose, which often is green and may contain pus (purulent).  Swelling and warmth over the affected sinuses. DIAGNOSIS  Your health care provider will perform a physical exam. During the exam, your health care provider may:  Look in your nose for signs of abnormal growths in your nostrils (nasal polyps).  Tap over the affected sinus to check for signs of infection.  View the inside of your sinuses (endoscopy) using an imaging device that has a light attached (endoscope). If your health  care provider suspects that you have chronic sinusitis, one or more of the following tests may be recommended:  Allergy tests.  Nasal culture. A sample of mucus is taken from your nose, sent to a lab, and screened for bacteria.  Nasal cytology. A sample of mucus is taken from your nose and examined by your health care provider to determine if your sinusitis is related to an allergy. TREATMENT  Most cases of acute sinusitis are related to a viral infection and will resolve on their own within 10 days. Sometimes medicines are prescribed to help relieve symptoms (pain medicine, decongestants, nasal steroid sprays, or saline sprays).  However, for sinusitis related to a bacterial infection, your health care provider will prescribe antibiotic medicines. These are medicines that will help kill the bacteria causing the infection.  Rarely, sinusitis is caused by a fungal infection. In theses cases, your health care provider will prescribe antifungal medicine. For some cases of chronic sinusitis, surgery is needed. Generally, these are cases in which sinusitis recurs more than 3 times per year, despite other treatments. HOME CARE INSTRUCTIONS   Drink plenty of water. Water helps thin the mucus so your sinuses can drain more easily.  Use a humidifier.  Inhale steam 3 to 4 times a day (for example, sit in the bathroom with the shower running).  Apply a warm, moist washcloth to your face 3 to 4 times a day, or as directed by your health care provider.  Use saline nasal sprays to help moisten and clean your sinuses.  Take medicines only as directed by your health care provider.    If you were prescribed either an antibiotic or antifungal medicine, finish it all even if you start to feel better. SEEK IMMEDIATE MEDICAL CARE IF:  You have increasing pain or severe headaches.  You have nausea, vomiting, or drowsiness.  You have swelling around your face.  You have vision problems.  You have a stiff  neck.  You have difficulty breathing. MAKE SURE YOU:   Understand these instructions.  Will watch your condition.  Will get help right away if you are not doing well or get worse. Document Released: 07/15/2005 Document Revised: 11/29/2013 Document Reviewed: 07/30/2011 Exit[employer] Patient Information 2015 [employer]. This information is not intended to replace advice given to you by your health care provider. Make sure you discuss any questions you have with your health care provider.  - Take meds as prescribed - Use a cool mist humidifier  -Use saline nose sprays frequently -Saline irrigations of the nose can be very helpful if done frequently.  * 4X daily for 1 week*  * Use of a nettie pot can be helpful with this. Follow directions with this* -Force fluids -For any cough or congestion  Use plain Mucinex- regular strength or max strength is fine   * Children- consult with Pharmacist for dosing -For fever or aces or pains- take tylenol or ibuprofen appropriate for age and weight.  * for fevers greater than 101 orally you may alternate ibuprofen and tylenol every  3 hours. -Throat lozenges if help   Nazaire Cordial, FNP  

## 2015-02-22 NOTE — Progress Notes (Signed)
   Subjective:    Patient ID: Cynthia Leon, female    DOB: 13-Jul-1951, 64 y.o.   MRN: [id number]  Sinusitis This is a new problem. The current episode started 1 to 4 weeks ago. The problem has been gradually worsening since onset. There has been no fever. Her pain is at a severity of 7/10. The pain is moderate. Associated symptoms include congestion, coughing, headaches, a hoarse voice, sinus pressure and sneezing. Pertinent negatives include no chills, ear pain, shortness of breath, sore throat or swollen glands. Past treatments include oral decongestants and spray decongestants. The treatment provided mild relief.      Review of Systems  Constitutional: Negative.  Negative for chills.  HENT: Positive for congestion, hoarse voice, sinus pressure and sneezing. Negative for ear pain and sore throat.   Eyes: Negative.   Respiratory: Positive for cough. Negative for shortness of breath.   Cardiovascular: Negative.  Negative for palpitations.  Gastrointestinal: Negative.   Endocrine: Negative.   Genitourinary: Negative.   Musculoskeletal: Negative.   Neurological: Positive for headaches.  Hematological: Negative.   Psychiatric/Behavioral: Negative.   All other systems reviewed and are negative.      Objective:   Physical Exam  Constitutional: She is oriented to person, place, and time. She appears well-developed and well-nourished. No distress.  HENT:  Head: Normocephalic and atraumatic.  Right Ear: External ear normal.  Mouth/Throat: Oropharynx is clear and moist.  Eyes: Pupils are equal, round, and reactive to light.  Neck: Normal range of motion. Neck supple. No thyromegaly present.  Cardiovascular: Normal rate, regular rhythm, normal heart sounds and intact distal pulses.   No murmur heard. Pulmonary/Chest: Effort normal and breath sounds normal. No respiratory distress. She has no wheezes.  Abdominal: Soft. Bowel sounds are normal. She exhibits no distension. There is  no tenderness.  Musculoskeletal: Normal range of motion. She exhibits no edema or tenderness.  Neurological: She is alert and oriented to person, place, and time. She has normal reflexes. No cranial nerve deficit.  Skin: Skin is warm and dry.  Psychiatric: She has a normal mood and affect. Her behavior is normal. Judgment and thought content normal.  Vitals reviewed.     BP 130/82 mmHg  Pulse 91  Temp(Src) 97.2 F (36.2 C) (Oral)  Ht 5\' 6"  (1.676 m)  Wt 236 lb (107.049 kg)  BMI 38.11 kg/m2     Assessment & Plan:  1. Acute maxillary sinusitis, recurrence not specified -- Take meds as prescribed - Use a cool mist humidifier  -Use saline nose sprays frequently -Saline irrigations of the nose can be very helpful if done frequently.  * 4X daily for 1 week*  * Use of a nettie pot can be helpful with this. Follow directions with this* -Force fluids -For any cough or congestion  Use plain Mucinex- regular strength or max strength is fine   * Children- consult with Pharmacist for dosing -For fever or aces or pains- take tylenol or ibuprofen appropriate for age and weight.  * for fevers greater than 101 orally you may alternate ibuprofen and tylenol every  3 hours. -Throat lozenges if help - amoxicillin-clavulanate (AUGMENTIN) 875-125 MG per tablet; Take 1 tablet by mouth 2 (two) times daily.  Dispense: 14 tablet; Refill: 0 - fluticasone (FLONASE) 50 MCG/ACT nasal spray; Place 2 sprays into both nostrils daily.  Dispense: 16 g; Refill: [place], FNP

## 2015-02-27 ENCOUNTER — Other Ambulatory Visit: Payer: Self-pay | Admitting: Family

## 2015-03-06 ENCOUNTER — Encounter: Payer: Self-pay | Admitting: Family

## 2015-03-06 ENCOUNTER — Ambulatory Visit (INDEPENDENT_AMBULATORY_CARE_PROVIDER_SITE_OTHER): Payer: PRIVATE HEALTH INSURANCE | Admitting: Family

## 2015-03-06 VITALS — BP 132/80 | HR 93 | Temp 97.1°F | Ht 66.0 in | Wt 230.0 lb

## 2015-03-06 DIAGNOSIS — J0110 Acute frontal sinusitis, unspecified: Secondary | ICD-10-CM | POA: Diagnosis not present

## 2015-03-06 DIAGNOSIS — R5383 Other fatigue: Secondary | ICD-10-CM | POA: Diagnosis not present

## 2015-03-06 DIAGNOSIS — M791 Myalgia: Secondary | ICD-10-CM | POA: Diagnosis not present

## 2015-03-06 DIAGNOSIS — E785 Hyperlipidemia, unspecified: Secondary | ICD-10-CM | POA: Diagnosis not present

## 2015-03-06 DIAGNOSIS — M609 Myositis, unspecified: Secondary | ICD-10-CM

## 2015-03-06 DIAGNOSIS — G43009 Migraine without aura, not intractable, without status migrainosus: Secondary | ICD-10-CM | POA: Diagnosis not present

## 2015-03-06 DIAGNOSIS — K219 Gastro-esophageal reflux disease without esophagitis: Secondary | ICD-10-CM | POA: Diagnosis not present

## 2015-03-06 DIAGNOSIS — G43909 Migraine, unspecified, not intractable, without status migrainosus: Secondary | ICD-10-CM | POA: Insufficient documentation

## 2015-03-06 DIAGNOSIS — E559 Vitamin D deficiency, unspecified: Secondary | ICD-10-CM

## 2015-03-06 DIAGNOSIS — F411 Generalized anxiety disorder: Secondary | ICD-10-CM

## 2015-03-06 DIAGNOSIS — IMO0001 Reserved for inherently not codable concepts without codable children: Secondary | ICD-10-CM

## 2015-03-06 MED ORDER — ALPRAZOLAM 0.5 MG PO TABS: ORAL_TABLET | ORAL | Status: DC

## 2015-03-06 MED ORDER — CYCLOBENZAPRINE HCL 10 MG PO TABS: 10.0000 mg | ORAL_TABLET | Freq: Three times a day (TID) | ORAL | Status: DC | PRN

## 2015-03-06 MED ORDER — LEVOFLOXACIN 500 MG PO TABS: 500.0000 mg | ORAL_TABLET | Freq: Every day | ORAL | Status: DC

## 2015-03-06 NOTE — Progress Notes (Signed)
Subjective:    Patient ID: Cynthia Leon, female    DOB: 1951-03-01, 64 y.o.   MRN: [id number]  Pt presents to the office today for chronic follow up. Pt was seen at the office today for sinusitis on 02/22/15/. Pt stats she still feels "achy and just not good". Hyperlipidemia This is a chronic problem. The current episode started more than 1 year ago. The problem is controlled. Recent lipid tests were reviewed and are normal. Exacerbating diseases include obesity. She has no history of diabetes or hypothyroidism. Associated symptoms include leg pain. Pertinent negatives include no shortness of breath. Current antihyperlipidemic treatment includes statins. The current treatment provides significant improvement of lipids. Risk factors for coronary artery disease include dyslipidemia, family history, obesity and post-menopausal.  Anxiety Presents for follow-up visit. Onset was 1 to 6 months ago. Patient reports no depressed mood, excessive worry, hyperventilation, muscle tension, nervous/anxious behavior, palpitations, panic or shortness of breath. Symptoms occur rarely. The severity of symptoms is mild.   Her past medical history is significant for anxiety/panic attacks. There is no history of depression. Past treatments include benzodiazephines. Compliance with prior treatments has been good.  Gastrophageal Reflux She complains of a hoarse voice and a sore throat. She reports no belching, no coughing, no heartburn or no water brash. This is a chronic problem. The current episode started more than 1 year ago. The problem occurs rarely. The problem has been resolved. The symptoms are aggravated by certain foods. Risk factors include obesity. She has tried a PPI for the symptoms. The treatment provided significant relief.  Sinusitis This is a recurrent problem. The current episode started 1 to 4 weeks ago. The problem has been waxing and waning since onset. Her pain is at a severity of 7/10. The  pain is mild. Associated symptoms include ear pain, a hoarse voice, sinus pressure and a sore throat. Pertinent negatives include no coughing, headaches or shortness of breath. Past treatments include antibiotics. The treatment provided mild relief.  Migraine  This is a chronic problem. The current episode started more than 1 year ago. The problem has been unchanged. The pain quality is similar to prior headaches. Associated symptoms include ear pain, sinus pressure and a sore throat. Pertinent negatives include no coughing. She has tried beta blockers for the symptoms. The treatment provided mild relief. Her past medical history is significant for migraine headaches and obesity.      Review of Systems  Constitutional: Negative.   HENT: Positive for ear pain, hoarse voice, sinus pressure and sore throat.   Eyes: Negative.   Respiratory: Negative.  Negative for cough and shortness of breath.   Cardiovascular: Negative.  Negative for palpitations.  Gastrointestinal: Negative.  Negative for heartburn.  Endocrine: Negative.   Genitourinary: Negative.   Musculoskeletal: Negative.   Neurological: Negative.  Negative for headaches.  Hematological: Negative.   Psychiatric/Behavioral: Negative.  The patient is not nervous/anxious.   All other systems reviewed and are negative.      Objective:   Physical Exam  Constitutional: She is oriented to person, place, and time. She appears well-developed and well-nourished. No distress.  HENT:  Head: Normocephalic and atraumatic.  Right Ear: There is swelling.  Left Ear: There is swelling.  Nose: Right sinus exhibits frontal sinus tenderness. Left sinus exhibits frontal sinus tenderness.  Mouth/Throat: Oropharynx is clear and moist.  Eyes: Pupils are equal, round, and reactive to light.  Neck: Normal range of motion. Neck supple. No thyromegaly present.  Cardiovascular: Normal  rate, regular rhythm, normal heart sounds and intact distal pulses.   No  murmur heard. Pulmonary/Chest: Effort normal and breath sounds normal. No respiratory distress. She has no wheezes.  Abdominal: Soft. Bowel sounds are normal. She exhibits no distension. There is no tenderness.  Musculoskeletal: Normal range of motion. She exhibits no edema or tenderness.  Neurological: She is alert and oriented to person, place, and time. She has normal reflexes. No cranial nerve deficit.  Skin: Skin is warm and dry.  Psychiatric: She has a normal mood and affect. Her behavior is normal. Judgment and thought content normal.  Vitals reviewed.  BP 132/80 mmHg  Pulse 93  Temp(Src) 97.1 F (36.2 C) (Oral)  Ht '5\' 6"'  (1.676 m)  Wt 230 lb (104.327 kg)  BMI 37.14 kg/m2        Assessment & Plan:  1. Gastroesophageal reflux disease, esophagitis presence not specified - CMP14+EGFR  2. Hyperlipidemia - CMP14+EGFR - Lipid panel  3. GAD (generalized anxiety disorder) - ALPRAZolam (XANAX) 0.5 MG tablet; TAKE ONE TABLET BY MOUTH AT BEDTIME AS NEEDED FOR ANXIETY  Dispense: 30 tablet; Refill: 3 - CMP14+EGFR  4. Vitamin D deficiency - CMP14+EGFR - Vit D  25 hydroxy (rtn osteoporosis monitoring)  5. Migraine without aura and without status migrainosus, not intractable - CMP14+EGFR  6. Myalgia and myositis - cyclobenzaprine (FLEXERIL) 10 MG tablet; Take 1 tablet (10 mg total) by mouth 3 (three) times daily as needed. for muscle spams  Dispense: 60 tablet; Refill: 2 - CMP14+EGFR  7. Other fatigue - CMP14+EGFR - Thyroid Panel With TSH  8. Acute frontal sinusitis, recurrence not specified -- Take meds as prescribed - Use a cool mist humidifier  -Use saline nose sprays frequently -Saline irrigations of the nose can be very helpful if done frequently.  * 4X daily for 1 week*  * Use of a nettie pot can be helpful with this. Follow directions with this* -Force fluids -For any cough or congestion  Use plain Mucinex- regular strength or max strength is fine   * Children-  consult with Pharmacist for dosing -For fever or aces or pains- take tylenol or ibuprofen appropriate for age and weight.  * for fevers greater than 101 orally you may alternate ibuprofen and tylenol every  3 hours. -Throat lozenges if help - levofloxacin (LEVAQUIN) 500 MG tablet; Take 1 tablet (500 mg total) by mouth daily.  Dispense: 7 tablet; Refill: 0   Continue all meds Labs pending Health Maintenance reviewed Diet and exercise encouraged RTO 6 months  Evelina Dun, FNP

## 2015-03-06 NOTE — Patient Instructions (Addendum)
Health Maintenance Adopting a healthy lifestyle and getting preventive care can go a long way to promote health and wellness. Talk with your health care provider about what schedule of regular examinations is right for you. This is a good chance for you to check in with your provider about disease prevention and staying healthy. In between checkups, there are plenty of things you can do on your own. Experts have done a lot of research about which lifestyle changes and preventive measures are most likely to keep you healthy. Ask your health care provider for more information. WEIGHT AND DIET  Eat a healthy diet  Be sure to include plenty of vegetables, fruits, low-fat dairy products, and lean protein.  Do not eat a lot of foods high in solid fats, added sugars, or salt.  Get regular exercise. This is one of the most important things you can do for your health.  Most adults should exercise for at least 150 minutes each week. The exercise should increase your heart rate and make you sweat (moderate-intensity exercise).  Most adults should also do strengthening exercises at least twice a week. This is in addition to the moderate-intensity exercise.  Maintain a healthy weight  Body mass index (BMI) is a measurement that can be used to identify possible weight problems. It estimates body fat based on height and weight. Your health care provider can help determine your BMI and help you achieve or maintain a healthy weight.  For females 25 years of age and older:   A BMI below 18.5 is considered underweight.  A BMI of 18.5 to 24.9 is normal.  A BMI of 25 to 29.9 is considered overweight.  A BMI of 30 and above is considered obese.  Watch levels of cholesterol and blood lipids  You should start having your blood tested for lipids and cholesterol at 65 years of age, then have this test every 5 years.  You may need to have your cholesterol levels checked more often if:  Your lipid or  cholesterol levels are high.  You are older than 65 years of age.  You are at high risk for heart disease.  CANCER SCREENING   Lung Cancer  Lung cancer screening is recommended for adults 97-92 years old who are at high risk for lung cancer because of a history of smoking.  A yearly low-dose CT scan of the lungs is recommended for people who:  Currently smoke.  Have quit within the past 15 years.  Have at least a 30-pack-year history of smoking. A pack year is smoking an average of one pack of cigarettes a day for 1 year.  Yearly screening should continue until it has been 15 years since you quit.  Yearly screening should stop if you develop a health problem that would prevent you from having lung cancer treatment.  Breast Cancer  Practice breast self-awareness. This means understanding how your breasts normally appear and feel.  It also means doing regular breast self-exams. Let your health care provider know about any changes, no matter how small.  If you are in your 20s or 30s, you should have a clinical breast exam (CBE) by a health care provider every 1-3 years as part of a regular health exam.  If you are 76 or older, have a CBE every year. Also consider having a breast X-ray (mammogram) every year.  If you have a family history of breast cancer, talk to your health care provider about genetic screening.  If you are  at high risk for breast cancer, talk to your health care provider about having an MRI and a mammogram every year.  Breast cancer gene (BRCA) assessment is recommended for women who have family members with BRCA-related cancers. BRCA-related cancers include:  Breast.  Ovarian.  Tubal.  Peritoneal cancers.  Results of the assessment will determine the need for genetic counseling and BRCA1 and BRCA2 testing. Cervical Cancer Routine pelvic examinations to screen for cervical cancer are no longer recommended for nonpregnant women who are considered low  risk for cancer of the pelvic organs (ovaries, uterus, and vagina) and who do not have symptoms. A pelvic examination may be necessary if you have symptoms including those associated with pelvic infections. Ask your health care provider if a screening pelvic exam is right for you.   The Pap test is the screening test for cervical cancer for women who are considered at risk.  If you had a hysterectomy for a problem that was not cancer or a condition that could lead to cancer, then you no longer need Pap tests.  If you are older than 65 years, and you have had normal Pap tests for the past 10 years, you no longer need to have Pap tests.  If you have had past treatment for cervical cancer or a condition that could lead to cancer, you need Pap tests and screening for cancer for at least 20 years after your treatment.  If you no longer get a Pap test, assess your risk factors if they change (such as having a new sexual partner). This can affect whether you should start being screened again.  Some women have medical problems that increase their chance of getting cervical cancer. If this is the case for you, your health care provider may recommend more frequent screening and Pap tests.  The human papillomavirus (HPV) test is another test that may be used for cervical cancer screening. The HPV test looks for the virus that can cause cell changes in the cervix. The cells collected during the Pap test can be tested for HPV.  The HPV test can be used to screen women 30 years of age and older. Getting tested for HPV can extend the interval between normal Pap tests from three to five years.  An HPV test also should be used to screen women of any age who have unclear Pap test results.  After 65 years of age, women should have HPV testing as often as Pap tests.  Colorectal Cancer  This type of cancer can be detected and often prevented.  Routine colorectal cancer screening usually begins at 65 years of  age and continues through 65 years of age.  Your health care provider may recommend screening at an earlier age if you have risk factors for colon cancer.  Your health care provider may also recommend using home test kits to check for hidden blood in the stool.  A small camera at the end of a tube can be used to examine your colon directly (sigmoidoscopy or colonoscopy). This is done to check for the earliest forms of colorectal cancer.  Routine screening usually begins at age 50.  Direct examination of the colon should be repeated every 5-10 years through 65 years of age. However, you may need to be screened more often if early forms of precancerous polyps or small growths are found. Skin Cancer  Check your skin from head to toe regularly.  Tell your health care provider about any new moles or changes in   moles, especially if there is a change in a mole's shape or color.  Also tell your health care provider if you have a mole that is larger than the size of a pencil eraser.  Always use sunscreen. Apply sunscreen liberally and repeatedly throughout the day.  Protect yourself by wearing long sleeves, pants, a wide-brimmed hat, and sunglasses whenever you are outside. HEART DISEASE, DIABETES, AND HIGH BLOOD PRESSURE   Have your blood pressure checked at least every 1-2 years. High blood pressure causes heart disease and increases the risk of stroke.  If you are between 75 years and 42 years old, ask your health care provider if you should take aspirin to prevent strokes.  Have regular diabetes screenings. This involves taking a blood sample to check your fasting blood sugar level.  If you are at a normal weight and have a low risk for diabetes, have this test once every three years after 65 years of age.  If you are overweight and have a high risk for diabetes, consider being tested at a younger age or more often. PREVENTING INFECTION  Hepatitis B  If you have a higher risk for  hepatitis B, you should be screened for this virus. You are considered at high risk for hepatitis B if:  You were born in a country where hepatitis B is common. Ask your health care provider which countries are considered high risk.  Your parents were born in a high-risk country, and you have not been immunized against hepatitis B (hepatitis B vaccine).  You have HIV or AIDS.  You use needles to inject street drugs.  You live with someone who has hepatitis B.  You have had sex with someone who has hepatitis B.  You get hemodialysis treatment.  You take certain medicines for conditions, including cancer, organ transplantation, and autoimmune conditions. Hepatitis C  Blood testing is recommended for:  Everyone born from [birth year] through 1966.  Anyone with known risk factors for hepatitis C. Sexually transmitted infections (STIs)  You should be screened for sexually transmitted infections (STIs) including gonorrhea and chlamydia if:  You are sexually active and are younger than 65 years of age.  You are older than 65 years of age and your health care provider tells you that you are at risk for this type of infection.  Your sexual activity has changed since you were last screened and you are at an increased risk for chlamydia or gonorrhea. Ask your health care provider if you are at risk.  If you do not have HIV, but are at risk, it may be recommended that you take a prescription medicine daily to prevent HIV infection. This is called pre-exposure prophylaxis (PrEP). You are considered at risk if:  You are sexually active and do not regularly use condoms or know the HIV status of your partner(s).  You take drugs by injection.  You are sexually active with a partner who has HIV. Talk with your health care provider about whether you are at high risk of being infected with HIV. If you choose to begin PrEP, you should first be tested for HIV. You should then be tested every 3 months for  as long as you are taking PrEP.  PREGNANCY   If you are premenopausal and you may become pregnant, ask your health care provider about preconception counseling.  If you may become pregnant, take 400 to 800 micrograms (mcg) of folic acid every day.  If you want to prevent pregnancy, talk to your  health care provider about birth control (contraception). OSTEOPOROSIS AND MENOPAUSE   Osteoporosis is a disease in which the bones lose minerals and strength with aging. This can result in serious bone fractures. Your risk for osteoporosis can be identified using a bone density scan.  If you are 78 years of age or older, or if you are at risk for osteoporosis and fractures, ask your health care provider if you should be screened.  Ask your health care provider whether you should take a calcium or vitamin D supplement to lower your risk for osteoporosis.  Menopause may have certain physical symptoms and risks.  Hormone replacement therapy may reduce some of these symptoms and risks. Talk to your health care provider about whether hormone replacement therapy is right for you.  HOME CARE INSTRUCTIONS   Schedule regular health, dental, and eye exams.  Stay current with your immunizations.   Do not use any tobacco products including cigarettes, chewing tobacco, or electronic cigarettes.  If you are pregnant, do not drink alcohol.  If you are breastfeeding, limit how much and how often you drink alcohol.  Limit alcohol intake to no more than 1 drink per day for nonpregnant women. One drink equals 12 ounces of beer, 5 ounces of wine, or 1 ounces of hard liquor.  Do not use street drugs.  Do not share needles.  Ask your health care provider for help if you need support or information about quitting drugs.  Tell your health care provider if you often feel depressed.  Tell your health care provider if you have ever been abused or do not feel safe at home. Document Released: 01/28/2011  Document Revised: 11/29/2013 Document Reviewed: 06/16/2013 [facility] Patient Information 2015 [place], [place]. This information is not intended to replace advice given to you by your health care provider. Make sure you discuss any questions you have with your health care provider.  - Take meds as prescribed - Use a cool mist humidifier  -Use saline nose sprays frequently -Saline irrigations of the nose can be very helpful if done frequently.  * 4X daily for 1 week*  * Use of a nettie pot can be helpful with this. Follow directions with this* -Force fluids -For any cough or congestion  Use plain Mucinex- regular strength or max strength is fine   * Children- consult with Pharmacist for dosing -For fever or aces or pains- take tylenol or ibuprofen appropriate for age and weight.  * for fevers greater than 101 orally you may alternate ibuprofen and tylenol every  3 hours. -Throat lozenges if help   Evelina Dun, FNP

## 2015-03-07 ENCOUNTER — Other Ambulatory Visit: Payer: Self-pay | Admitting: Family

## 2015-03-07 LAB — LIPID PANEL
Chol/HDL Ratio: 4.9 ratio units — ABNORMAL HIGH (ref 0.0–4.4)
Cholesterol, Total: 172 mg/dL (ref 100–199)
HDL: 35 mg/dL — AB (ref >39)
LDL Calculated: 59 mg/dL (ref 0–99)
Triglycerides: 392 mg/dL — ABNORMAL HIGH (ref 0–149)
VLDL CHOLESTEROL CAL: 78 mg/dL — AB (ref 5–40)

## 2015-03-07 LAB — CMP14+EGFR
ALT: 22 IU/L (ref 0–32)
AST: 12 IU/L (ref 0–40)
Albumin/Globulin Ratio: 1.7 (ref 1.1–2.5)
Albumin: 4.2 g/dL (ref 3.6–4.8)
Alkaline Phosphatase: 96 IU/L (ref 39–117)
BUN / CREAT RATIO: 9 — AB (ref 11–26)
BUN: 7 mg/dL — ABNORMAL LOW (ref 8–27)
CALCIUM: 9.9 mg/dL (ref 8.7–10.3)
CHLORIDE: 102 mmol/L (ref 97–108)
CO2: 24 mmol/L (ref 18–29)
Creatinine, Ser: 0.74 mg/dL (ref 0.57–1.00)
GFR calc non Af Amer: 86 mL/min/{1.73_m2} (ref >59)
GFR, EST AFRICAN AMERICAN: 99 mL/min/{1.73_m2} (ref >59)
GLOBULIN, TOTAL: 2.5 g/dL (ref 1.5–4.5)
Glucose: 98 mg/dL (ref 65–99)
Potassium: 4.8 mmol/L (ref 3.5–5.2)
SODIUM: 144 mmol/L (ref 134–144)
Total Protein: 6.7 g/dL (ref 6.0–8.5)

## 2015-03-07 LAB — THYROID PANEL WITH TSH
Free Thyroxine Index: 1.9 (ref 1.2–4.9)
T3 UPTAKE RATIO: 27 % (ref 24–39)
T4, Total: 7 ug/dL (ref 4.5–12.0)
TSH: 1.49 u[IU]/mL (ref 0.450–4.500)

## 2015-03-07 LAB — VITAMIN D 25 HYDROXY (VIT D DEFICIENCY, FRACTURES): VIT D 25 HYDROXY: 15.7 ng/mL — AB (ref 30.0–100.0)

## 2015-03-07 MED ORDER — VITAMIN D (ERGOCALCIFEROL) 1.25 MG (50000 UNIT) PO CAPS
50000.0000 [IU] | ORAL_CAPSULE | ORAL | Status: DC
Start: 2015-03-07 — End: 2015-09-08

## 2015-03-17 ENCOUNTER — Other Ambulatory Visit: Payer: Self-pay | Admitting: Family

## 2015-03-17 NOTE — Telephone Encounter (Signed)
Refill called to [facility] 

## 2015-03-17 NOTE — Telephone Encounter (Signed)
Last filled 02/10/15, last seen 03/06/15. Route to pool, nurse call in at [facility]

## 2015-03-20 ENCOUNTER — Other Ambulatory Visit: Payer: Self-pay | Admitting: Family

## 2015-04-01 ENCOUNTER — Other Ambulatory Visit: Payer: Self-pay | Admitting: Family

## 2015-04-01 DIAGNOSIS — R030 Elevated blood-pressure reading, without diagnosis of hypertension: Secondary | ICD-10-CM | POA: Diagnosis not present

## 2015-04-01 DIAGNOSIS — L299 Pruritus, unspecified: Secondary | ICD-10-CM | POA: Diagnosis not present

## 2015-04-05 ENCOUNTER — Other Ambulatory Visit: Payer: Self-pay

## 2015-04-05 MED ORDER — ROSUVASTATIN CALCIUM 20 MG PO TABS: ORAL_TABLET | ORAL | Status: DC

## 2015-05-14 DIAGNOSIS — K121 Other forms of stomatitis: Secondary | ICD-10-CM | POA: Diagnosis not present

## 2015-05-18 ENCOUNTER — Encounter: Payer: Self-pay | Admitting: Family

## 2015-05-18 ENCOUNTER — Ambulatory Visit (INDEPENDENT_AMBULATORY_CARE_PROVIDER_SITE_OTHER): Payer: PRIVATE HEALTH INSURANCE | Admitting: Family

## 2015-05-18 VITALS — BP 150/93 | HR 85 | Temp 97.7°F | Ht 66.0 in | Wt 232.0 lb

## 2015-05-18 DIAGNOSIS — M609 Myositis, unspecified: Secondary | ICD-10-CM | POA: Diagnosis not present

## 2015-05-18 DIAGNOSIS — K1120 Sialoadenitis, unspecified: Secondary | ICD-10-CM

## 2015-05-18 DIAGNOSIS — M791 Myalgia: Secondary | ICD-10-CM | POA: Diagnosis not present

## 2015-05-18 DIAGNOSIS — B370 Candidal stomatitis: Secondary | ICD-10-CM

## 2015-05-18 DIAGNOSIS — IMO0001 Reserved for inherently not codable concepts without codable children: Secondary | ICD-10-CM

## 2015-05-18 MED ORDER — CYCLOBENZAPRINE HCL 10 MG PO TABS: 10.0000 mg | ORAL_TABLET | Freq: Three times a day (TID) | ORAL | Status: DC | PRN

## 2015-05-18 MED ORDER — ALPRAZOLAM 0.5 MG PO TABS: 0.5000 mg | ORAL_TABLET | Freq: Every evening | ORAL | Status: DC | PRN

## 2015-05-18 NOTE — Progress Notes (Signed)
   Subjective:    Patient ID: Cynthia Leon, female    DOB: January 22, 1951, 64 y.o.   MRN: [id number]  HPI Pt presents to the office today to follow up on oral thrush. Pt went to the Urgent care on Sunday and was given Magic Mouthwash. Pt states her mouth has improved, but she still has swelling under her chin and the top of her mouth is red.    Review of Systems  Constitutional: Negative.   HENT: Negative.   Eyes: Negative.   Respiratory: Negative.  Negative for shortness of breath.   Cardiovascular: Negative.  Negative for palpitations.  Gastrointestinal: Negative.   Endocrine: Negative.   Genitourinary: Negative.   Musculoskeletal: Negative.   Neurological: Negative.  Negative for headaches.  Hematological: Negative.   Psychiatric/Behavioral: Negative.   All other systems reviewed and are negative.      Objective:   Physical Exam  Constitutional: She is oriented to person, place, and time. She appears well-developed and well-nourished. No distress.  HENT:  Head: Normocephalic and atraumatic.  Right Ear: External ear normal.  Left Ear: External ear normal.  Mouth/Throat: Oropharynx is clear and moist.  Eyes: Pupils are equal, round, and reactive to light.  Neck: Normal range of motion. Neck supple. No thyromegaly present.  Cardiovascular: Normal rate, regular rhythm, normal heart sounds and intact distal pulses.   No murmur heard. Pulmonary/Chest: Effort normal and breath sounds normal. No respiratory distress. She has no wheezes.  Abdominal: Soft. Bowel sounds are normal. She exhibits no distension. There is no tenderness.  Musculoskeletal: Normal range of motion. She exhibits no edema or tenderness.  Neurological: She is alert and oriented to person, place, and time. She has normal reflexes. No cranial nerve deficit.  Skin: Skin is warm and dry.  Psychiatric: She has a normal mood and affect. Her behavior is normal. Judgment and thought content normal.  Vitals  reviewed.     BP 150/93 mmHg  Pulse 85  Temp(Src) 97.7 F (36.5 C) (Oral)  Ht 5\' 6"  (1.676 m)  Wt 232 lb (105.235 kg)  BMI 37.46 kg/m2     Assessment & Plan:  1. Oral thrush -Continue with magic mouthwash 4 times a day -Try to keep dentures out of mouth as much as posible  2. Myalgia and myositis - cyclobenzaprine (FLEXERIL) 10 MG tablet; Take 1 tablet (10 mg total) by mouth 3 (three) times daily as needed. for muscle spams  Dispense: 60 tablet; Refill: 2  3. Sialadenitis -Encourage to suck on sour candy -Massage the gland -Warm compresses -Force fluids -RTO in 2 weeks if not better  Evelina Dun, FNP

## 2015-05-18 NOTE — Patient Instructions (Addendum)
Thrush, Adult Cynthia Leon, also called oral candidiasis, is a fungal infection that develops in the mouth and throat and on the tongue. It causes white patches to form on the mouth and tongue. Cynthia Leon is most common in older adults, but it can occur at any age.  Many cases of thrush are mild, but this infection can also be more serious. Cynthia Leon can be a recurring problem for people who have chronic illnesses or who take medicines that limit the body's ability to fight infection. Because these people have difficulty fighting infections, the fungus that causes thrush can spread throughout the body. This can cause life-threatening blood or organ infections. CAUSES  Cynthia Leon is usually caused by a yeast called Candida albicans. This fungus is normally present in small amounts in the mouth and on other mucous membranes. It usually causes no harm. However, when conditions are present that allow the fungus to grow uncontrolled, it invades surrounding tissues and becomes an infection. Less often, other Candida species can also lead to thrush.  RISK FACTORS Cynthia Leon is more likely to develop in the following people:  People with an impaired ability to fight infection (weakened immune system).   Older adults.   People with HIV.   People with diabetes.   People with dry mouth (xerostomia).   Pregnant women.   People with poor dental care, especially those who have false teeth.   People who use antibiotic medicines.  SIGNS AND SYMPTOMS  Cynthia Leon can be a mild infection that causes no symptoms. If symptoms develop, they may include:   A burning feeling in the mouth and throat. This can occur at the start of a thrush infection.   White patches that adhere to the mouth and tongue. The tissue around the patches may be red, raw, and painful. If rubbed (during tooth brushing, for example), the patches and the tissue of the mouth may bleed easily.   A bad taste in the mouth or difficulty tasting foods.    Cottony feeling in the mouth.   Pain during eating and swallowing. DIAGNOSIS  Your health care provider can usually diagnose thrush by looking in your mouth and asking you questions about your health.  TREATMENT  Medicines that help prevent the growth of fungi (antifungals) are the standard treatment for thrush. These medicines are either applied directly to the affected area (topical) or swallowed (oral). The treatment will depend on the severity of the condition.  Mild Thrush Mild cases of thrush may clear up with the use of an antifungal mouth rinse or lozenges. Treatment usually lasts about 14 days.  Moderate to Severe Thrush  More severe thrush infections that have spread to the esophagus are treated with an oral antifungal medicine. A topical antifungal medicine may also be used.   For some severe infections, a treatment period longer than 14 days may be needed.   Oral antifungal medicines are almost never used during pregnancy because the fetus may be harmed. However, if a pregnant woman has a rare, severe thrush infection that has spread to her blood, oral antifungal medicines may be used. In this case, the risk of harm to the mother and fetus from the severe thrush infection may be greater than the risk posed by the use of antifungal medicines.  Persistent or Recurrent Thrush For cases of thrush that do not go away or keep coming back, treatment may involve the following:   Treatment may be needed twice as long as the symptoms last.   Treatment will include  both oral and topical antifungal medicines.   People with weakened immune systems can take an antifungal medicine on a continuous basis to prevent thrush infections.  It is important to treat conditions that make you more likely to get thrush, such as diabetes or HIV.  HOME CARE INSTRUCTIONS   Only take over-the-counter or prescription medicine as directed by your health care provider. Talk to your health care  provider about an over-the-counter medicine called gentian violet, which kills bacteria and fungi.   Eat plain, unflavored yogurt as directed by your health care provider. Check the label to make sure the yogurt contains live cultures. This yogurt can help healthy bacteria grow in the mouth that can stop the growth of the fungus that causes thrush.   Try these measures to help reduce the discomfort of thrush:   Drink cold liquids such as water or iced tea.   Try flavored ice treats or frozen juices.   Eat foods that are easy to swallow, such as gelatin, ice cream, or custard.   If the patches in your mouth are painful, try drinking from a straw.   Rinse your mouth several times a day with a warm saltwater rinse. You can make the saltwater mixture with 1 tsp (6 g) of salt in 8 fl oz (0.2 L) of warm water.   If you wear dentures, remove the dentures before going to bed, brush them vigorously, and soak them in a cleaning solution as directed by your health care provider.   Women who are breastfeeding should clean their nipples with an antifungal medicine as directed by their health care provider. Dry the nipples after breastfeeding. Applying lanolin-containing body lotion may help relieve nipple soreness.  SEEK MEDICAL CARE IF:  Your symptoms are getting worse or are not improving within 7 days of starting treatment.   You have symptoms of spreading infection, such as white patches on the skin outside of the mouth.   You are nursing and you have redness, burning, or pain in the nipples that is not relieved with treatment.  MAKE SURE YOU:  Understand these instructions.  Will watch your condition.  Will get help right away if you are not doing well or get worse.   This information is not intended to replace advice given to you by your health care provider. Make sure you discuss any questions you have with your health care provider.   Document Released: 04/09/2004 Document  Revised: 08/05/2014 Document Reviewed: 02/15/2013 Elsevier Interactive Patient Education 2016 [employer] Salivary Gland Infection A salivary gland infection is an infection in one or more of the glands that produce spit (saliva). You have six major salivary glands. Each gland has a duct that carries saliva into your mouth. Saliva keeps your mouth moist and breaks down the food that you eat. It also helps to prevent tooth decay. Two salivary glands are located just in front of your ears (parotid). The ducts for these glands open up inside your cheeks, near your back teeth. You also have two glands under your tongue (sublingual) and two glands under your jaw (submandibular). The ducts for these glands open under your tongue. Any salivary gland can become infected. Most infections occur in the parotid glands or submandibular glands. CAUSES Salivary glands can be infected by viruses or bacteria.  The mumps virus is the most common cause of viral salivary gland infections, though mumps is now rare in many areas because of vaccination.  This infection causes swelling in both parotid  glands.  Viral infections are more common in children.  The bacteria that cause salivary gland infections are usually the same bacteria that normally live in your mouth.  A stone can form in a salivary gland and block the flow of saliva. As a result, saliva backs up into the salivary gland. Bacteria may then start to grow behind the blockage and cause infection.  Bacterial infections usually cause pain and swelling on one side of the face. Submandibular gland swelling occurs under the jaw. Parotid swelling occurs in front of the ear.  Bacterial infections are more common in adults. RISK FACTORS Children who do not get the MMR (measles, mumps, rubella) vaccine are more likely to get mumps, which can cause a viral salivary gland infection. Risk factors for bacterial infections include:  Poor dental care (oral  hygiene).  Smoking.  Not drinking enough water.  Having a disease that causes dry mouth and dry eyes (Mikulicz syndrome or Sjogren syndrome). SIGNS AND SYMPTOMS The main sign of salivary gland infection is a swollen salivary gland. This type of inflammation is often called sialadenitis. You may have swelling in front of your ear, under your jaw, or under your tongue. Swelling may get worse when you eat and decrease after you eat. Other signs and symptoms include:  Pain.  Tenderness.  Redness.  Dry mouth.  Bad taste in your mouth.  Difficulty chewing and swallowing.  Fever. DIAGNOSIS Your health care provider may suspect a salivary gland infection based on your signs and symptoms. He or she will also do a physical exam. The health care provider will look and feel inside your mouth to see whether a stone is blocking a salivary gland duct. You may need to see an ear, nose, and throat specialist (ENT or otolaryngologist) for diagnosis and treatment. You may also need to have diagnostic tests, such as:  An X-ray to check for a stone.  Other imaging studies to look for an abscess and to rule out other causes of swelling. These tests may include:  Ultrasound.  CT scan.  MRI.  Culture and sensitivity test. This involves collecting a sample of pus for testing in the lab to see what bacteria grow and what antibiotics they are sensitive to. The testing sample may be:  Swabbed from a salivary gland duct.  Withdrawn from a swollen gland with a needle (aspiration). TREATMENT Viral salivary gland infections usually clear up without treatment. Bacterial infections are usually treated with antibiotic medicine. Severe infections that cause difficulty with swallowing may be treated with an IV antibiotic in the hospital. Other treatments may include:  Probing and widening the salivary duct to allow a stone to pass. In some cases, a thin, flexible scope (endoscope) may be inserted into the  duct to find a stone and remove it.  Breaking up a stone using sound waves.  Draining an infected gland (abscess) with a needle.  In some cases, you may need surgery so your health care provider can:  Remove a stone.  Drain pus from an abscess.  Remove a badly infected gland. HOME CARE INSTRUCTIONS  Take medicines only as directed by your health care provider.  If you were prescribed an antibiotic medicine, finish it all even if you start to feel better.  Follow these instructions every few hours:  Suck on a lemon candy to stimulate the flow of saliva.  Put a warm compress over the gland.  Gently massage the gland.  Drink enough fluid to keep your urine clear  or pale yellow.  Rinse your mouth with a mixture of warm water and salt every few hours. To make this mixture, add a pinch of salt to 1 cup of warm water.  Practice good oral hygiene by brushing and flossing your teeth after meals and before you go to bed.  Do not use any tobacco products, including cigarettes, chewing tobacco, or electronic cigarettes. If you need help quitting, ask your health care provider. SEEK MEDICAL CARE IF:  You have pain and swelling in your face, jaw, or mouth after eating.  You have persistent swelling in any of these places:  In front of your ear.  Under your jaw.  Inside your mouth. SEEK IMMEDIATE MEDICAL CARE IF:   You have pain and swelling in your face, jaw, or mouth that are getting worse.  Your pain and swelling make it hard to swallow or breathe.   This information is not intended to replace advice given to you by your health care provider. Make sure you discuss any questions you have with your health care provider.   Document Released: 08/22/2004 Document Revised: 08/05/2014 Document Reviewed: 12/15/2013 Elsevier Interactive Patient Education [employer].

## 2015-06-14 ENCOUNTER — Other Ambulatory Visit: Payer: Self-pay | Admitting: Family

## 2015-07-04 ENCOUNTER — Other Ambulatory Visit: Payer: Self-pay | Admitting: Family

## 2015-07-21 ENCOUNTER — Other Ambulatory Visit: Payer: Self-pay | Admitting: Family

## 2015-09-05 ENCOUNTER — Other Ambulatory Visit: Payer: Self-pay | Admitting: Family

## 2015-09-08 ENCOUNTER — Encounter: Payer: Self-pay | Admitting: Family

## 2015-09-08 ENCOUNTER — Ambulatory Visit (INDEPENDENT_AMBULATORY_CARE_PROVIDER_SITE_OTHER): Payer: PRIVATE HEALTH INSURANCE | Admitting: Family

## 2015-09-08 VITALS — BP 137/84 | HR 101 | Temp 97.7°F | Ht 66.0 in | Wt 233.4 lb

## 2015-09-08 DIAGNOSIS — F411 Generalized anxiety disorder: Secondary | ICD-10-CM

## 2015-09-08 DIAGNOSIS — E559 Vitamin D deficiency, unspecified: Secondary | ICD-10-CM

## 2015-09-08 DIAGNOSIS — E669 Obesity, unspecified: Secondary | ICD-10-CM | POA: Diagnosis not present

## 2015-09-08 DIAGNOSIS — Z1211 Encounter for screening for malignant neoplasm of colon: Secondary | ICD-10-CM | POA: Diagnosis not present

## 2015-09-08 DIAGNOSIS — G43009 Migraine without aura, not intractable, without status migrainosus: Secondary | ICD-10-CM

## 2015-09-08 DIAGNOSIS — E8881 Metabolic syndrome: Secondary | ICD-10-CM

## 2015-09-08 DIAGNOSIS — K219 Gastro-esophageal reflux disease without esophagitis: Secondary | ICD-10-CM

## 2015-09-08 DIAGNOSIS — E785 Hyperlipidemia, unspecified: Secondary | ICD-10-CM | POA: Diagnosis not present

## 2015-09-08 DIAGNOSIS — Z1159 Encounter for screening for other viral diseases: Secondary | ICD-10-CM | POA: Diagnosis not present

## 2015-09-08 NOTE — Progress Notes (Signed)
Subjective:    Patient ID: Cynthia Leon, female    DOB: Sep 17, 1950, 65 y.o.   MRN: [id number]  Pt presents to the office today for chronic follow up.  Hyperlipidemia This is a chronic problem. The current episode started more than 1 year ago. The problem is controlled. Recent lipid tests were reviewed and are normal. Exacerbating diseases include obesity. She has no history of diabetes or hypothyroidism. Associated symptoms include leg pain. Current antihyperlipidemic treatment includes statins. The current treatment provides significant improvement of lipids. Risk factors for coronary artery disease include dyslipidemia, family history, obesity and post-menopausal.  Gastroesophageal Reflux She reports no belching, no heartburn or no water brash. This is a chronic problem. The current episode started more than 1 year ago. The problem occurs rarely. The problem has been resolved. The symptoms are aggravated by certain foods. Risk factors include obesity. She has tried a PPI for the symptoms. The treatment provided significant relief.  Anxiety Presents for follow-up visit. Onset was 1 to 6 months ago. Symptoms include nervous/anxious behavior. Patient reports no depressed mood, excessive worry, hyperventilation, muscle tension, palpitations or panic. Symptoms occur rarely. The severity of symptoms is mild. The symptoms are aggravated by family issues (Brother is dying with cancer).   Her past medical history is significant for anxiety/panic attacks. There is no history of depression. Past treatments include benzodiazephines. Compliance with prior treatments has been good.  Migraine  This is a chronic problem. The current episode started more than 1 year ago. The problem has been unchanged. The pain quality is similar to prior headaches. The quality of the pain is described as aching. The pain is at a severity of 6/10. The pain is moderate. She has tried beta blockers for the symptoms. The  treatment provided mild relief. Her past medical history is significant for migraine headaches and obesity.      Review of Systems  Constitutional: Negative.   Eyes: Negative.   Respiratory: Negative.   Cardiovascular: Negative.  Negative for palpitations.  Gastrointestinal: Negative.  Negative for heartburn.  Endocrine: Negative.   Genitourinary: Negative.   Musculoskeletal: Negative.   Neurological: Negative.   Hematological: Negative.   Psychiatric/Behavioral: The patient is nervous/anxious.   All other systems reviewed and are negative.      Objective:   Physical Exam  Constitutional: She is oriented to person, place, and time. She appears well-developed and well-nourished. No distress.  HENT:  Head: Normocephalic and atraumatic.  Mouth/Throat: Oropharynx is clear and moist.  Eyes: Pupils are equal, round, and reactive to light.  Neck: Normal range of motion. Neck supple. No thyromegaly present.  Cardiovascular: Normal rate, regular rhythm, normal heart sounds and intact distal pulses.   No murmur heard. Pulmonary/Chest: Effort normal and breath sounds normal. No respiratory distress. She has no wheezes.  Abdominal: Soft. Bowel sounds are normal. She exhibits no distension. There is no tenderness.  Musculoskeletal: Normal range of motion. She exhibits edema (trace amt in right wrist) and tenderness.  Neurological: She is alert and oriented to person, place, and time. She has normal reflexes. No cranial nerve deficit.  Skin: Skin is warm and dry.  Psychiatric: She has a normal mood and affect. Her behavior is normal. Judgment and thought content normal.  Vitals reviewed.  BP 137/84 mmHg  Pulse 101  Temp(Src) 97.7 F (36.5 C) (Oral)  Ht '5\' 6"'  (1.676 m)  Wt 233 lb 6.4 oz (105.87 kg)  BMI 37.69 kg/m2  Assessment & Plan:  1. Migraine without aura and without status migrainosus, not intractable - CMP14+EGFR  2. Gastroesophageal reflux disease, esophagitis  presence not specified - CMP14+EGFR  3. Vitamin D deficiency - CMP14+EGFR - VITAMIN D 25 Hydroxy (Vit-D Deficiency, Fractures)  4. GAD (generalized anxiety disorder) - CMP14+EGFR  5. Hyperlipidemia - CMP14+EGFR - Lipid panel  6. Need for hepatitis C screening test - CMP14+EGFR - Hepatitis C antibody  7. Colon cancer screening - CMP14+EGFR - Ambulatory referral to Gastroenterology   Continue all meds Labs pending Health Maintenance reviewed Diet and exercise encouraged RTO 6 months  Evelina Dun, FNP

## 2015-09-08 NOTE — Patient Instructions (Signed)
Health Maintenance, Female Adopting a healthy lifestyle and getting preventive care can go a long way to promote health and wellness. Talk with your health care provider about what schedule of regular examinations is right for you. This is a good chance for you to check in with your provider about disease prevention and staying healthy. In between checkups, there are plenty of things you can do on your own. Experts have done a lot of research about which lifestyle changes and preventive measures are most likely to keep you healthy. Ask your health care provider for more information. WEIGHT AND DIET  Eat a healthy diet  Be sure to include plenty of vegetables, fruits, low-fat dairy products, and lean protein.  Do not eat a lot of foods high in solid fats, added sugars, or salt.  Get regular exercise. This is one of the most important things you can do for your health.  Most adults should exercise for at least 150 minutes each week. The exercise should increase your heart rate and make you sweat (moderate-intensity exercise).  Most adults should also do strengthening exercises at least twice a week. This is in addition to the moderate-intensity exercise.  Maintain a healthy weight  Body mass index (BMI) is a measurement that can be used to identify possible weight problems. It estimates body fat based on height and weight. Your health care provider can help determine your BMI and help you achieve or maintain a healthy weight.  For females 20 years of age and older:   A BMI below 18.5 is considered underweight.  A BMI of 18.5 to 24.9 is normal.  A BMI of 25 to 29.9 is considered overweight.  A BMI of 30 and above is considered obese.  Watch levels of cholesterol and blood lipids  You should start having your blood tested for lipids and cholesterol at 66 years of age, then have this test every 5 years.  You may need to have your cholesterol levels checked more often if:  Your lipid  or cholesterol levels are high.  You are older than 66 years of age.  You are at high risk for heart disease.  CANCER SCREENING   Lung Cancer  Lung cancer screening is recommended for adults 55-80 years old who are at high risk for lung cancer because of a history of smoking.  A yearly low-dose CT scan of the lungs is recommended for people who:  Currently smoke.  Have quit within the past 15 years.  Have at least a 30-pack-year history of smoking. A pack year is smoking an average of one pack of cigarettes a day for 1 year.  Yearly screening should continue until it has been 15 years since you quit.  Yearly screening should stop if you develop a health problem that would prevent you from having lung cancer treatment.  Breast Cancer  Practice breast self-awareness. This means understanding how your breasts normally appear and feel.  It also means doing regular breast self-exams. Let your health care provider know about any changes, no matter how small.  If you are in your 20s or 30s, you should have a clinical breast exam (CBE) by a health care provider every 1-3 years as part of a regular health exam.  If you are 40 or older, have a CBE every year. Also consider having a breast X-ray (mammogram) every year.  If you have a family history of breast cancer, talk to your health care provider about genetic screening.  If you   are at high risk for breast cancer, talk to your health care provider about having an MRI and a mammogram every year.  Breast cancer gene (BRCA) assessment is recommended for women who have family members with BRCA-related cancers. BRCA-related cancers include:  Breast.  Ovarian.  Tubal.  Peritoneal cancers.  Results of the assessment will determine the need for genetic counseling and BRCA1 and BRCA2 testing. Cervical Cancer Your health care provider may recommend that you be screened regularly for cancer of the pelvic organs (ovaries, uterus, and  vagina). This screening involves a pelvic examination, including checking for microscopic changes to the surface of your cervix (Pap test). You may be encouraged to have this screening done every 3 years, beginning at age 21.  For women ages 30-65, health care providers may recommend pelvic exams and Pap testing every 3 years, or they may recommend the Pap and pelvic exam, combined with testing for human papilloma virus (HPV), every 5 years. Some types of HPV increase your risk of cervical cancer. Testing for HPV may also be done on women of any age with unclear Pap test results.  Other health care providers may not recommend any screening for nonpregnant women who are considered low risk for pelvic cancer and who do not have symptoms. Ask your health care provider if a screening pelvic exam is right for you.  If you have had past treatment for cervical cancer or a condition that could lead to cancer, you need Pap tests and screening for cancer for at least 20 years after your treatment. If Pap tests have been discontinued, your risk factors (such as having a new sexual partner) need to be reassessed to determine if screening should resume. Some women have medical problems that increase the chance of getting cervical cancer. In these cases, your health care provider may recommend more frequent screening and Pap tests. Colorectal Cancer  This type of cancer can be detected and often prevented.  Routine colorectal cancer screening usually begins at 66 years of age and continues through 66 years of age.  Your health care provider may recommend screening at an earlier age if you have risk factors for colon cancer.  Your health care provider may also recommend using home test kits to check for hidden blood in the stool.  A small camera at the end of a tube can be used to examine your colon directly (sigmoidoscopy or colonoscopy). This is done to check for the earliest forms of colorectal  cancer.  Routine screening usually begins at age 50.  Direct examination of the colon should be repeated every 5-10 years through 66 years of age. However, you may need to be screened more often if early forms of precancerous polyps or small growths are found. Skin Cancer  Check your skin from head to toe regularly.  Tell your health care provider about any new moles or changes in moles, especially if there is a change in a mole's shape or color.  Also tell your health care provider if you have a mole that is larger than the size of a pencil eraser.  Always use sunscreen. Apply sunscreen liberally and repeatedly throughout the day.  Protect yourself by wearing long sleeves, pants, a wide-brimmed hat, and sunglasses whenever you are outside. HEART DISEASE, DIABETES, AND HIGH BLOOD PRESSURE   High blood pressure causes heart disease and increases the risk of stroke. High blood pressure is more likely to develop in:  People who have blood pressure in the high end   of the normal range (130-139/85-89 mm Hg).  People who are overweight or obese.  People who are African American.  If you are 38-23 years of age, have your blood pressure checked every 3-5 years. If you are 61 years of age or older, have your blood pressure checked every year. You should have your blood pressure measured twice--once when you are at a hospital or clinic, and once when you are not at a hospital or clinic. Record the average of the two measurements. To check your blood pressure when you are not at a hospital or clinic, you can use:  An automated blood pressure machine at a pharmacy.  A home blood pressure monitor.  If you are between 45 years and 39 years old, ask your health care provider if you should take aspirin to prevent strokes.  Have regular diabetes screenings. This involves taking a blood sample to check your fasting blood sugar level.  If you are at a normal weight and have a low risk for diabetes,  have this test once every three years after 66 years of age.  If you are overweight and have a high risk for diabetes, consider being tested at a younger age or more often. PREVENTING INFECTION  Hepatitis B  If you have a higher risk for hepatitis B, you should be screened for this virus. You are considered at high risk for hepatitis B if:  You were born in a country where hepatitis B is common. Ask your health care provider which countries are considered high risk.  Your parents were born in a high-risk country, and you have not been immunized against hepatitis B (hepatitis B vaccine).  You have HIV or AIDS.  You use needles to inject street drugs.  You live with someone who has hepatitis B.  You have had sex with someone who has hepatitis B.  You get hemodialysis treatment.  You take certain medicines for conditions, including cancer, organ transplantation, and autoimmune conditions. Hepatitis C  Blood testing is recommended for:  Everyone born from [birth year] through 1966.  Anyone with known risk factors for hepatitis C. Sexually transmitted infections (STIs)  You should be screened for sexually transmitted infections (STIs) including gonorrhea and chlamydia if:  You are sexually active and are younger than 66 years of age.  You are older than 66 years of age and your health care provider tells you that you are at risk for this type of infection.  Your sexual activity has changed since you were last screened and you are at an increased risk for chlamydia or gonorrhea. Ask your health care provider if you are at risk.  If you do not have HIV, but are at risk, it may be recommended that you take a prescription medicine daily to prevent HIV infection. This is called pre-exposure prophylaxis (PrEP). You are considered at risk if:  You are sexually active and do not regularly use condoms or know the HIV status of your partner(s).  You take drugs by injection.  You are sexually  active with a partner who has HIV. Talk with your health care provider about whether you are at high risk of being infected with HIV. If you choose to begin PrEP, you should first be tested for HIV. You should then be tested every 3 months for as long as you are taking PrEP.  PREGNANCY   If you are premenopausal and you may become pregnant, ask your health care provider about preconception counseling.  If you may  become pregnant, take 400 to 800 micrograms (mcg) of folic acid every day.  If you want to prevent pregnancy, talk to your health care provider about birth control (contraception). OSTEOPOROSIS AND MENOPAUSE   Osteoporosis is a disease in which the bones lose minerals and strength with aging. This can result in serious bone fractures. Your risk for osteoporosis can be identified using a bone density scan.  If you are 61 years of age or older, or if you are at risk for osteoporosis and fractures, ask your health care provider if you should be screened.  Ask your health care provider whether you should take a calcium or vitamin D supplement to lower your risk for osteoporosis.  Menopause may have certain physical symptoms and risks.  Hormone replacement therapy may reduce some of these symptoms and risks. Talk to your health care provider about whether hormone replacement therapy is right for you.  HOME CARE INSTRUCTIONS   Schedule regular health, dental, and eye exams.  Stay current with your immunizations.   Do not use any tobacco products including cigarettes, chewing tobacco, or electronic cigarettes.  If you are pregnant, do not drink alcohol.  If you are breastfeeding, limit how much and how often you drink alcohol.  Limit alcohol intake to no more than 1 drink per day for nonpregnant women. One drink equals 12 ounces of beer, 5 ounces of wine, or 1 ounces of hard liquor.  Do not use street drugs.  Do not share needles.  Ask your health care provider for help if  you need support or information about quitting drugs.  Tell your health care provider if you often feel depressed.  Tell your health care provider if you have ever been abused or do not feel safe at home.   This information is not intended to replace advice given to you by your health care provider. Make sure you discuss any questions you have with your health care provider.   Document Released: 01/28/2011 Document Revised: 08/05/2014 Document Reviewed: 06/16/2013 Elsevier Interactive Patient Education [employer].

## 2015-09-09 ENCOUNTER — Other Ambulatory Visit: Payer: Self-pay | Admitting: Family

## 2015-09-09 LAB — CMP14+EGFR
ALBUMIN: 4.3 g/dL (ref 3.6–4.8)
ALK PHOS: 95 IU/L (ref 39–117)
ALT: 25 IU/L (ref 0–32)
AST: 14 IU/L (ref 0–40)
Albumin/Globulin Ratio: 1.7 (ref 1.1–2.5)
BUN / CREAT RATIO: 11 (ref 11–26)
BUN: 8 mg/dL (ref 8–27)
Bilirubin Total: <0.2 mg/dL (ref 0.0–1.2)
CO2: 25 mmol/L (ref 18–29)
CREATININE: 0.72 mg/dL (ref 0.57–1.00)
Calcium: 9.7 mg/dL (ref 8.7–10.3)
Chloride: 102 mmol/L (ref 96–106)
GFR, EST AFRICAN AMERICAN: 102 mL/min/{1.73_m2} (ref >59)
GFR, EST NON AFRICAN AMERICAN: 88 mL/min/{1.73_m2} (ref >59)
GLOBULIN, TOTAL: 2.5 g/dL (ref 1.5–4.5)
GLUCOSE: 113 mg/dL — AB (ref 65–99)
Potassium: 4.6 mmol/L (ref 3.5–5.2)
SODIUM: 143 mmol/L (ref 134–144)
TOTAL PROTEIN: 6.8 g/dL (ref 6.0–8.5)

## 2015-09-09 LAB — LIPID PANEL
CHOL/HDL RATIO: 4 ratio (ref 0.0–4.4)
Cholesterol, Total: 157 mg/dL (ref 100–199)
HDL: 39 mg/dL — ABNORMAL LOW (ref >39)
LDL CALC: 65 mg/dL (ref 0–99)
Triglycerides: 266 mg/dL — ABNORMAL HIGH (ref 0–149)
VLDL CHOLESTEROL CAL: 53 mg/dL — AB (ref 5–40)

## 2015-09-09 LAB — HEPATITIS C ANTIBODY

## 2015-09-09 LAB — VITAMIN D 25 HYDROXY (VIT D DEFICIENCY, FRACTURES): VIT D 25 HYDROXY: 13.9 ng/mL — AB (ref 30.0–100.0)

## 2015-09-13 ENCOUNTER — Encounter (INDEPENDENT_AMBULATORY_CARE_PROVIDER_SITE_OTHER): Payer: Self-pay | Admitting: *Deleted

## 2015-09-15 ENCOUNTER — Other Ambulatory Visit: Payer: Self-pay | Admitting: Family

## 2015-09-19 ENCOUNTER — Other Ambulatory Visit: Payer: Self-pay | Admitting: Family

## 2015-09-21 NOTE — Telephone Encounter (Signed)
Refill called to [facility] 

## 2015-09-25 ENCOUNTER — Other Ambulatory Visit: Payer: Self-pay | Admitting: Family

## 2015-09-28 ENCOUNTER — Other Ambulatory Visit (INDEPENDENT_AMBULATORY_CARE_PROVIDER_SITE_OTHER): Payer: Self-pay | Admitting: *Deleted

## 2015-09-28 DIAGNOSIS — Z1211 Encounter for screening for malignant neoplasm of colon: Secondary | ICD-10-CM

## 2015-10-24 ENCOUNTER — Other Ambulatory Visit: Payer: Self-pay | Admitting: Family

## 2015-10-24 ENCOUNTER — Encounter: Payer: Self-pay | Admitting: Family

## 2015-10-24 ENCOUNTER — Ambulatory Visit (INDEPENDENT_AMBULATORY_CARE_PROVIDER_SITE_OTHER): Payer: PRIVATE HEALTH INSURANCE | Admitting: Family

## 2015-10-24 ENCOUNTER — Ambulatory Visit (INDEPENDENT_AMBULATORY_CARE_PROVIDER_SITE_OTHER): Payer: PRIVATE HEALTH INSURANCE

## 2015-10-24 VITALS — BP 126/74 | HR 101 | Temp 97.1°F | Ht 66.0 in | Wt 236.4 lb

## 2015-10-24 DIAGNOSIS — M25531 Pain in right wrist: Secondary | ICD-10-CM | POA: Diagnosis not present

## 2015-10-24 DIAGNOSIS — G5601 Carpal tunnel syndrome, right upper limb: Secondary | ICD-10-CM

## 2015-10-24 DIAGNOSIS — M25331 Other instability, right wrist: Secondary | ICD-10-CM

## 2015-10-24 MED ORDER — NAPROXEN 500 MG PO TABS: 500.0000 mg | ORAL_TABLET | Freq: Two times a day (BID) | ORAL | Status: DC

## 2015-10-24 MED ORDER — PREDNISONE 10 MG (21) PO TBPK: 10.0000 mg | ORAL_TABLET | Freq: Every day | ORAL | Status: DC

## 2015-10-24 NOTE — Progress Notes (Signed)
   Subjective:    Patient ID: Cynthia Leon, female    DOB: [birth year], 65 y.o.   MRN: [id number]  Wrist Pain  The pain is present in the right wrist, right shoulder and right arm. This is a new problem. The current episode started 1 to 4 weeks ago. There has been a history of trauma [place] on it 20  years ago). The problem occurs intermittently. The problem has been unchanged. The pain is at a severity of 9/10. The pain is mild. Associated symptoms include joint swelling. Pertinent negatives include no inability to bear weight, numbness, stiffness or tingling. The symptoms are aggravated by activity. She has tried acetaminophen and NSAIDS for the symptoms. The treatment provided mild relief. Her past medical history is significant for osteoarthritis.      Review of Systems  Constitutional: Negative.   HENT: Negative.   Eyes: Negative.   Respiratory: Negative.  Negative for shortness of breath.   Cardiovascular: Negative.  Negative for palpitations.  Gastrointestinal: Negative.   Endocrine: Negative.   Genitourinary: Negative.   Musculoskeletal: Negative.  Negative for stiffness.  Neurological: Negative.  Negative for tingling, numbness and headaches.  Hematological: Negative.   Psychiatric/Behavioral: Negative.   All other systems reviewed and are negative.      Objective:   Physical Exam  Constitutional: She is oriented to person, place, and time. She appears well-developed and well-nourished. No distress.  HENT:  Head: Normocephalic and atraumatic.  Eyes: Pupils are equal, round, and reactive to light.  Neck: Normal range of motion. Neck supple. No thyromegaly present.  Cardiovascular: Normal rate, regular rhythm, normal heart sounds and intact distal pulses.   No murmur heard. Pulmonary/Chest: Effort normal and breath sounds normal. No respiratory distress. She has no wheezes.  Abdominal: Soft. Bowel sounds are normal. She exhibits no distension. There is no tenderness.   Musculoskeletal: Normal range of motion. She exhibits edema (trace edema in right right). She exhibits no tenderness.  tinel and phalen sign positive   Neurological: She is alert and oriented to person, place, and time. She has normal reflexes. No cranial nerve deficit.  Skin: Skin is warm and dry.  Psychiatric: She has a normal mood and affect. Her behavior is normal. Judgment and thought content normal.  Vitals reviewed.  Right wrist- WNL, Preliminary reading by Evelina Dun, FNP [facility]    BP 126/74 mmHg  Pulse 101  Temp(Src) 97.1 F (36.2 C) (Oral)  Ht '5\' 6"'$  (1.676 m)  Wt 236 lb 6.4 oz (107.23 kg)  BMI 38.17 kg/m2     Assessment & Plan:  1. Right wrist pain - DG Wrist Complete Right; Future - Arthritis Panel - BMP8+EGFR  2. Carpal tunnel syndrome of right wrist -Rest -Ice -Wear brace every night -Take naprosyn for next 7-10 days with food BID -RTO in 2 weeks if not improved will send to ortho - BMP8+EGFR - naproxen (NAPROSYN) 500 MG tablet; Take 1 tablet (500 mg total) by mouth 2 (two) times daily with a meal.  Dispense: 60 tablet; Refill: 1 - predniSONE (STERAPRED UNI-PAK 21 TAB) 10 MG (21) TBPK tablet; Take 1 tablet (10 mg total) by mouth daily. As directed x 6 days  Dispense: 21 tablet; Refill: 0  Evelina Dun, FNP

## 2015-10-24 NOTE — Patient Instructions (Signed)

## 2015-10-26 ENCOUNTER — Encounter: Payer: Self-pay | Admitting: *Deleted

## 2015-11-03 ENCOUNTER — Encounter (INDEPENDENT_AMBULATORY_CARE_PROVIDER_SITE_OTHER): Payer: Self-pay | Admitting: *Deleted

## 2015-11-03 ENCOUNTER — Other Ambulatory Visit (INDEPENDENT_AMBULATORY_CARE_PROVIDER_SITE_OTHER): Payer: Self-pay | Admitting: *Deleted

## 2015-11-03 MED ORDER — PEG 3350-KCL-NA BICARB-NACL 420 G PO SOLR: 4000.0000 mL | Freq: Once | ORAL | Status: DC

## 2015-11-03 NOTE — Telephone Encounter (Signed)
Patient needs trilyte 

## 2015-11-09 ENCOUNTER — Ambulatory Visit (INDEPENDENT_AMBULATORY_CARE_PROVIDER_SITE_OTHER): Payer: PRIVATE HEALTH INSURANCE | Admitting: Family

## 2015-11-09 ENCOUNTER — Encounter: Payer: Self-pay | Admitting: Family

## 2015-11-09 ENCOUNTER — Ambulatory Visit: Payer: PRIVATE HEALTH INSURANCE

## 2015-11-09 VITALS — BP 125/84 | HR 101 | Temp 97.4°F | Ht 66.0 in | Wt 236.0 lb

## 2015-11-09 DIAGNOSIS — Z23 Encounter for immunization: Secondary | ICD-10-CM | POA: Diagnosis not present

## 2015-11-09 DIAGNOSIS — M25531 Pain in right wrist: Secondary | ICD-10-CM | POA: Diagnosis not present

## 2015-11-09 DIAGNOSIS — Z780 Asymptomatic menopausal state: Secondary | ICD-10-CM

## 2015-11-09 NOTE — Progress Notes (Signed)
   Subjective:    Patient ID: Cynthia Leon, female    DOB: January 21, 1951, 65 y.o.   MRN: [id number]  HPI Pt presents to the office today with right wrist pain. Pt was seen in the office on 10/24/15 and given rx of naprosyn and prednisone dose package. PT had a x-ray that showed Scapholunate dissociation cannot be completely excluded. Pt states her wrist is the "same". Pt was referred to Ortho and states she has appt set up in the next few weeks.    Review of Systems  Constitutional: Negative.   HENT: Negative.   Eyes: Negative.   Respiratory: Negative.  Negative for shortness of breath.   Cardiovascular: Negative.  Negative for palpitations.  Gastrointestinal: Negative.   Endocrine: Negative.   Genitourinary: Negative.   Musculoskeletal: Negative.   Neurological: Negative.  Negative for headaches.  Hematological: Negative.   Psychiatric/Behavioral: Negative.   All other systems reviewed and are negative.      Objective:   Physical Exam  Constitutional: She is oriented to person, place, and time. She appears well-developed and well-nourished. No distress.  HENT:  Head: Normocephalic and atraumatic.  Right Ear: External ear normal.  Left Ear: External ear normal.  Mouth/Throat: Oropharynx is clear and moist.  Eyes: Pupils are equal, round, and reactive to light.  Neck: Normal range of motion. Neck supple. No thyromegaly present.  Cardiovascular: Normal rate, regular rhythm, normal heart sounds and intact distal pulses.   No murmur heard. Pulmonary/Chest: Effort normal and breath sounds normal. No respiratory distress. She has no wheezes.  Abdominal: Soft. Bowel sounds are normal. She exhibits no distension. There is no tenderness.  Musculoskeletal: She exhibits edema (trace in right wrist) and tenderness.  Limited ROM of right wrist with flexion   Neurological: She is alert and oriented to person, place, and time. She has normal reflexes. No cranial nerve deficit.  Skin:  Skin is warm and dry.  Psychiatric: She has a normal mood and affect. Her behavior is normal. Judgment and thought content normal.  Vitals reviewed.     BP 125/84 mmHg  Pulse 101  Temp(Src) 97.4 F (36.3 C) (Oral)  Ht 5\' 6"  (1.676 m)  Wt 236 lb (107.049 kg)  BMI 38.11 kg/m2     Assessment & Plan:  1. Post-menopausal -Encouraged weight bearing exercise - DG Bone Density; Future  2. Right wrist pain -Keep ortho appts -Wear brace  -Continue naprosyn  Evelina Dun, FNP

## 2015-11-09 NOTE — Patient Instructions (Signed)
Wrist Pain There are many things that can cause wrist pain. Some common causes include:  An injury to the wrist area, such as a sprain, strain, or fracture.  Overuse of the joint.  A condition that causes increased pressure on a nerve in the wrist (carpal tunnel syndrome).  Wear and tear of the joints that occurs with aging (osteoarthritis).  A variety of other types of arthritis. Sometimes, the cause of wrist pain is not known. The pain often goes away when you follow your health care provider's instructions for relieving pain at home. If your wrist pain continues, tests may need to be done to diagnose your condition. HOME CARE INSTRUCTIONS Pay attention to any changes in your symptoms. Take these actions to help with your pain:  Rest the wrist area for at least 48 hours or as told by your health care provider.  If directed, apply ice to the injured area:  Put ice in a plastic bag.  Place a towel between your skin and the bag.  Leave the ice on for 20 minutes, 2-3 times per day.  Keep your arm raised (elevated) above the level of your heart while you are sitting or lying down.  If a splint or elastic bandage has been applied, use it as told by your health care provider.  Remove the splint or bandage only as told by your health care provider.  Loosen the splint or bandage if your fingers become numb or have a tingling feeling, or if they turn cold or blue.  Take over-the-counter and prescription medicines only as told by your health care provider.  Keep all follow-up visits as told by your health care provider. This is important. SEEK MEDICAL CARE IF:  Your pain is not helped by treatment.  Your pain gets worse. SEEK IMMEDIATE MEDICAL CARE IF:  Your fingers become swollen.  Your fingers turn white, very red, or cold and blue.  Your fingers are numb or have a tingling feeling.  You have difficulty moving your fingers.   This information is not intended to replace  advice given to you by your health care provider. Make sure you discuss any questions you have with your health care provider.   Document Released: 04/24/2005 Document Revised: 04/05/2015 Document Reviewed: 11/30/2014 Elsevier Interactive Patient Education 2016 [employer]  

## 2015-11-11 ENCOUNTER — Telehealth: Payer: Self-pay | Admitting: Family

## 2015-11-11 NOTE — Telephone Encounter (Signed)
Patient informed via detailed message that it is not abnormal to have redness, swelling and tenderness at injection site.  Patient instructed to take Advil and use warm compresses and if not any better on Monday call for appointment.

## 2015-11-14 ENCOUNTER — Other Ambulatory Visit: Payer: Self-pay | Admitting: Family

## 2015-11-17 ENCOUNTER — Telehealth (INDEPENDENT_AMBULATORY_CARE_PROVIDER_SITE_OTHER): Payer: Self-pay | Admitting: *Deleted

## 2015-11-17 NOTE — Telephone Encounter (Signed)
Referring MD/PCP: [facility]   Procedure: tcs  Reason/Indication:  screening  Has patient had this procedure before?  no  If so, when, by whom and where?    Is there a family history of colon cancer?  no  Who?  What age when diagnosed?    Is patient diabetic?   no      Does patient have prosthetic heart valve or mechanical valve?  no  Do you have a pacemaker?  no  Has patient ever had endocarditis? no  Has patient had joint replacement within last 12 months?  no  Does patient tend to be constipated or take laxatives? no  Does patient have a history of alcohol/drug use?  no  Is patient on Coumadin, Plavix and/or Aspirin? no  Medications: cyclobenzaprine 10 mg tid, savella 50 mg daily, topiramate 50 mg bid, omeprazole 40 mg bid, rosuvastatin 20 mg daily, alprazolam 5 mg daily,   Allergies: asa  Medication Adjustment:   Procedure date & time: 12/13/15 at 1030

## 2015-11-20 ENCOUNTER — Encounter: Payer: PRIVATE HEALTH INSURANCE | Admitting: *Deleted

## 2015-11-20 DIAGNOSIS — Z1231 Encounter for screening mammogram for malignant neoplasm of breast: Secondary | ICD-10-CM | POA: Diagnosis not present

## 2015-11-20 LAB — HM MAMMOGRAPHY

## 2015-11-21 DIAGNOSIS — M7989 Other specified soft tissue disorders: Secondary | ICD-10-CM | POA: Diagnosis not present

## 2015-11-21 DIAGNOSIS — M1831 Unilateral post-traumatic osteoarthritis of first carpometacarpal joint, right hand: Secondary | ICD-10-CM | POA: Diagnosis not present

## 2015-11-21 DIAGNOSIS — M79644 Pain in right finger(s): Secondary | ICD-10-CM | POA: Diagnosis not present

## 2015-11-21 NOTE — Telephone Encounter (Signed)
agree

## 2015-11-29 ENCOUNTER — Encounter: Payer: Self-pay | Admitting: *Deleted

## 2015-12-11 ENCOUNTER — Other Ambulatory Visit: Payer: Self-pay | Admitting: Family

## 2015-12-13 ENCOUNTER — Encounter (HOSPITAL_COMMUNITY)
Admission: RE | Disposition: A | Discharge status: Home / Self Care | Payer: Self-pay | Source: Ambulatory Visit | Attending: Internal Medicine

## 2015-12-13 ENCOUNTER — Ambulatory Visit (HOSPITAL_COMMUNITY)
Admission: RE | Admit: 2015-12-13 | Discharge: 2015-12-13 | Disposition: A | Discharge status: Home / Self Care | Payer: No Typology Code available for payment source | Source: Ambulatory Visit | Attending: Internal Medicine | Admitting: Internal Medicine

## 2015-12-13 ENCOUNTER — Encounter (HOSPITAL_COMMUNITY): Payer: Self-pay

## 2015-12-13 DIAGNOSIS — Z1211 Encounter for screening for malignant neoplasm of colon: Secondary | ICD-10-CM | POA: Diagnosis not present

## 2015-12-13 DIAGNOSIS — Z8711 Personal history of peptic ulcer disease: Secondary | ICD-10-CM | POA: Diagnosis not present

## 2015-12-13 DIAGNOSIS — E785 Hyperlipidemia, unspecified: Secondary | ICD-10-CM | POA: Diagnosis not present

## 2015-12-13 DIAGNOSIS — Z96649 Presence of unspecified artificial hip joint: Secondary | ICD-10-CM | POA: Diagnosis not present

## 2015-12-13 DIAGNOSIS — D122 Benign neoplasm of ascending colon: Secondary | ICD-10-CM | POA: Insufficient documentation

## 2015-12-13 DIAGNOSIS — D120 Benign neoplasm of cecum: Secondary | ICD-10-CM | POA: Insufficient documentation

## 2015-12-13 DIAGNOSIS — Z801 Family history of malignant neoplasm of trachea, bronchus and lung: Secondary | ICD-10-CM | POA: Diagnosis not present

## 2015-12-13 DIAGNOSIS — K644 Residual hemorrhoidal skin tags: Secondary | ICD-10-CM | POA: Insufficient documentation

## 2015-12-13 DIAGNOSIS — Z7951 Long term (current) use of inhaled steroids: Secondary | ICD-10-CM | POA: Diagnosis not present

## 2015-12-13 DIAGNOSIS — Z803 Family history of malignant neoplasm of breast: Secondary | ICD-10-CM | POA: Insufficient documentation

## 2015-12-13 DIAGNOSIS — Z802 Family history of malignant neoplasm of other respiratory and intrathoracic organs: Secondary | ICD-10-CM | POA: Diagnosis not present

## 2015-12-13 DIAGNOSIS — K219 Gastro-esophageal reflux disease without esophagitis: Secondary | ICD-10-CM | POA: Diagnosis not present

## 2015-12-13 HISTORY — PX: COLONOSCOPY: SHX5424

## 2015-12-13 SURGERY — COLONOSCOPY
Anesthesia: Moderate Sedation

## 2015-12-13 MED ORDER — SODIUM CHLORIDE 0.9 % IV SOLN
INTRAVENOUS | Status: DC
  Administered 2015-12-13: 10:00:00 via INTRAVENOUS

## 2015-12-13 MED ORDER — MIDAZOLAM HCL 5 MG/5ML IJ SOLN
INTRAMUSCULAR | Status: DC | PRN
  Administered 2015-12-13 (×3): 2 mg via INTRAVENOUS
  Administered 2015-12-13: 3 mg via INTRAVENOUS

## 2015-12-13 MED ORDER — SIMETHICONE 40 MG/0.6ML PO SUSP
ORAL | Status: DC | PRN
  Administered 2015-12-13: 10:00:00

## 2015-12-13 MED ORDER — PROMETHAZINE HCL 25 MG/ML IJ SOLN
INTRAMUSCULAR | Status: AC
  Filled 2015-12-13: qty 1

## 2015-12-13 MED ORDER — PROMETHAZINE HCL 25 MG/ML IJ SOLN
INTRAMUSCULAR | Status: DC | PRN
  Administered 2015-12-13: 12.5 mg via INTRAVENOUS

## 2015-12-13 MED ORDER — MIDAZOLAM HCL 5 MG/5ML IJ SOLN
INTRAMUSCULAR | Status: AC
  Filled 2015-12-13: qty 10

## 2015-12-13 MED ORDER — MEPERIDINE HCL 50 MG/ML IJ SOLN
INTRAMUSCULAR | Status: DC | PRN
  Administered 2015-12-13 (×2): 25 mg via INTRAVENOUS

## 2015-12-13 MED ORDER — MEPERIDINE HCL 50 MG/ML IJ SOLN
INTRAMUSCULAR | Status: AC
  Filled 2015-12-13: qty 1

## 2015-12-13 NOTE — Discharge Instructions (Signed)
Resume usual medications and diet. No driving for 24 hours. Physician will call with biopsy results.    Colonoscopy, Care After These instructions give you information on caring for yourself after your procedure. Your doctor may also give you more specific instructions. Call your doctor if you have any problems or questions after your procedure. HOME CARE  Do not drive for 24 hours.  Do not sign important papers or use machinery for 24 hours.  You may shower.  You may go back to your usual activities, but go slower for the first 24 hours.  Take rest breaks often during the first 24 hours.  Walk around or use warm packs on your belly (abdomen) if you have belly cramping or gas.  Drink enough fluids to keep your pee (urine) clear or pale yellow.  Resume your normal diet. Avoid heavy or fried foods.  Avoid drinking alcohol for 24 hours or as told by your doctor.  Only take medicines as told by your doctor. If a tissue sample (biopsy) was taken during the procedure:   Do not take aspirin or blood thinners for 7 days, or as told by your doctor.  Do not drink alcohol for 7 days, or as told by your doctor.  Eat soft foods for the first 24 hours. GET HELP IF: You still have a small amount of blood in your poop (stool) 2-3 days after the procedure. GET HELP RIGHT AWAY IF:  You have more than a small amount of blood in your poop.  You see clumps of tissue (blood clots) in your poop.  Your belly is puffy (swollen).  You feel sick to your stomach (nauseous) or throw up (vomit).  You have a fever.  You have belly pain that gets worse and medicine does not help. MAKE SURE YOU:  Understand these instructions.  Will watch your condition.  Will get help right away if you are not doing well or get worse.   This information is not intended to replace advice given to you by your health care provider. Make sure you discuss any questions you have with your health care provider.   Document Released: 08/17/2010 Document Revised: 07/20/2013 Document Reviewed: 03/22/2013 Elsevier Interactive Patient Education 2016 [employer]   Colon Polyps Polyps are lumps of extra tissue growing inside the body. Polyps can grow in the large intestine (colon). Most colon polyps are noncancerous (benign). However, some colon polyps can become cancerous over time. Polyps that are larger than a pea may be harmful. To be safe, caregivers remove and test all polyps. CAUSES  Polyps form when mutations in the genes cause your cells to grow and divide even though no more tissue is needed. RISK FACTORS There are a number of risk factors that can increase your chances of getting colon polyps. They include:  Being older than 50 years.  Family history of colon polyps or colon cancer.  Long-term colon diseases, such as colitis or Crohn disease.  Being overweight.  Smoking.  Being inactive.  Drinking too much alcohol. SYMPTOMS  Most small polyps do not cause symptoms. If symptoms are present, they may include:  Blood in the stool. The stool may look dark red or black.  Constipation or diarrhea that lasts longer than 1 week. DIAGNOSIS People often do not know they have polyps until their caregiver finds them during a regular checkup. Your caregiver can use 4 tests to check for polyps:  Digital rectal exam. The caregiver wears gloves and feels inside the rectum.  This test would find polyps only in the rectum.  Barium enema. The caregiver puts a liquid called barium into your rectum before taking X-rays of your colon. Barium makes your colon look white. Polyps are dark, so they are easy to see in the X-ray pictures.  Sigmoidoscopy. A thin, flexible tube (sigmoidoscope) is placed into your rectum. The sigmoidoscope has a light and tiny camera in it. The caregiver uses the sigmoidoscope to look at the last third of your colon.  Colonoscopy. This test is like sigmoidoscopy, but the  caregiver looks at the entire colon. This is the most common method for finding and removing polyps. TREATMENT  Any polyps will be removed during a sigmoidoscopy or colonoscopy. The polyps are then tested for cancer. PREVENTION  To help lower your risk of getting more colon polyps:  Eat plenty of fruits and vegetables. Avoid eating fatty foods.  Do not smoke.  Avoid drinking alcohol.  Exercise every day.  Lose weight if recommended by your caregiver.  Eat plenty of calcium and folate. Foods that are rich in calcium include milk, cheese, and broccoli. Foods that are rich in folate include chickpeas, kidney beans, and spinach. HOME CARE INSTRUCTIONS Keep all follow-up appointments as directed by your caregiver. You may need periodic exams to check for polyps. SEEK MEDICAL CARE IF: You notice bleeding during a bowel movement.   This information is not intended to replace advice given to you by your health care provider. Make sure you discuss any questions you have with your health care provider.   Document Released: 04/10/2004 Document Revised: 08/05/2014 Document Reviewed: 09/24/2011 Elsevier Interactive Patient Education 2016 [employer].     Hemorrhoids Hemorrhoids are swollen veins around the rectum or anus. There are two types of hemorrhoids:   Internal hemorrhoids. These occur in the veins just inside the rectum. They may poke through to the outside and become irritated and painful.  External hemorrhoids. These occur in the veins outside the anus and can be felt as a painful swelling or hard lump near the anus. CAUSES  Pregnancy.   Obesity.   Constipation or diarrhea.   Straining to have a bowel movement.   Sitting for long periods on the toilet.  Heavy lifting or other activity that caused you to strain.  Anal intercourse. SYMPTOMS   Pain.   Anal itching or irritation.   Rectal bleeding.   Fecal leakage.   Anal swelling.   One or more  lumps around the anus.  DIAGNOSIS  Your caregiver may be able to diagnose hemorrhoids by visual examination. Other examinations or tests that may be performed include:   Examination of the rectal area with a gloved hand (digital rectal exam).   Examination of anal canal using a small tube (scope).   A blood test if you have lost a significant amount of blood.  A test to look inside the colon (sigmoidoscopy or colonoscopy). TREATMENT Most hemorrhoids can be treated at home. However, if symptoms do not seem to be getting better or if you have a lot of rectal bleeding, your caregiver may perform a procedure to help make the hemorrhoids get smaller or remove them completely. Possible treatments include:   Placing a rubber band at the base of the hemorrhoid to cut off the circulation (rubber band ligation).   Injecting a chemical to shrink the hemorrhoid (sclerotherapy).   Using a tool to burn the hemorrhoid (infrared light therapy).   Surgically removing the hemorrhoid (hemorrhoidectomy).   Stapling the  hemorrhoid to block blood flow to the tissue (hemorrhoid stapling).  HOME CARE INSTRUCTIONS   Eat foods with fiber, such as whole grains, beans, nuts, fruits, and vegetables. Ask your doctor about taking products with added fiber in them (fibersupplements).  Increase fluid intake. Drink enough water and fluids to keep your urine clear or pale yellow.   Exercise regularly.   Go to the bathroom when you have the urge to have a bowel movement. Do not wait.   Avoid straining to have bowel movements.   Keep the anal area dry and clean. Use wet toilet paper or moist towelettes after a bowel movement.   Medicated creams and suppositories may be used or applied as directed.   Only take over-the-counter or prescription medicines as directed by your caregiver.   Take warm sitz baths for 15-20 minutes, 3-4 times a day to ease pain and discomfort.   Place ice packs on the  hemorrhoids if they are tender and swollen. Using ice packs between sitz baths may be helpful.   Put ice in a plastic bag.   Place a towel between your skin and the bag.   Leave the ice on for 15-20 minutes, 3-4 times a day.   Do not use a donut-shaped pillow or sit on the toilet for long periods. This increases blood pooling and pain.  SEEK MEDICAL CARE IF:  You have increasing pain and swelling that is not controlled by treatment or medicine.  You have uncontrolled bleeding.  You have difficulty or you are unable to have a bowel movement.  You have pain or inflammation outside the area of the hemorrhoids. MAKE SURE YOU:  Understand these instructions.  Will watch your condition.  Will get help right away if you are not doing well or get worse.   This information is not intended to replace advice given to you by your health care provider. Make sure you discuss any questions you have with your health care provider.   Document Released: 07/12/2000 Document Revised: 07/01/2012 Document Reviewed: 05/19/2012 Elsevier Interactive Patient Education [employer].

## 2015-12-13 NOTE — Op Note (Signed)
[facility] Patient Name: Cynthia Leon Procedure Date: 12/13/2015 10:12 AM MRN: [id number] Date of Birth: August 21, 1950 Attending MD: Hildred Laser , MD CSN: [id number] Age: 65 Admit Type: Outpatient Procedure:                Colonoscopy Indications:              Screening for colorectal malignant neoplasm Providers:                Hildred Laser, MD, Rosina Lowenstein, RN, Isabella Stalling, Technician Referring MD:             Ms. Evelina Dun FNP Medicines:                Promethazine 12.5 mg IV, Meperidine 50 mg IV,                            Midazolam 10 mg IV Complications:            No immediate complications. Estimated Blood Loss:     Estimated blood loss was minimal. Procedure:                Pre-Anesthesia Assessment:                           - Prior to the procedure, a History and Physical                            was performed, and patient medications and                            allergies were reviewed. The patient's tolerance of                            previous anesthesia was also reviewed. The risks                            and benefits of the procedure and the sedation                            options and risks were discussed with the patient.                            All questions were answered, and informed consent                            was obtained. Prior Anticoagulants: The patient                            last took naproxen 7 days prior to the procedure.                            ASA Grade Assessment: II - A patient with mild  systemic disease. After reviewing the risks and                            benefits, the patient was deemed in satisfactory                            condition to undergo the procedure.                           After obtaining informed consent, the colonoscope                            was passed under direct vision. Throughout the   procedure, the patient's blood pressure, pulse, and                            oxygen saturations were monitored continuously. The                            [id number]) scope was introduced through                            the anus and advanced to the the cecum, identified                            by appendiceal orifice and ileocecal valve. The                            colonoscopy was performed without difficulty. The                            patient tolerated the procedure well. The quality                            of the bowel preparation was adequate. The                            ileocecal valve, appendiceal orifice, and rectum                            were photographed. Scope In: 10:27:56 AM Scope Out: 10:58:15 AM Scope Withdrawal Time: 0 hours 8 minutes 58 seconds  Total Procedure Duration: 0 hours 30 minutes 19 seconds  Findings:      Two sessile polyps were found in the cecum. The polyps were 3 to 5 mm in       size. These were biopsied with a cold forceps for histology.      Three sessile polyps were found in the ascending colon. The polyps were       4 mm in size. These were biopsied with a cold forceps for histology.      External hemorrhoids were found during retroflexion. The hemorrhoids       were small. Impression:               - Two 3 to 5 mm polyps in the cecum. Biopsied.                           -  Three 4 mm polyps in the ascending colon.                            Biopsied.                           - External hemorrhoids. Moderate Sedation:      Moderate (conscious) sedation was administered by the endoscopy nurse       and supervised by the endoscopist. The following parameters were       monitored: oxygen saturation, heart rate, blood pressure, CO2       capnography and response to care. Total physician intraservice time was       36 minutes. Recommendation:           - Patient has a contact number available for                             emergencies. The signs and symptoms of potential                            delayed complications were discussed with the                            patient. Return to normal activities tomorrow.                            Written discharge instructions were provided to the                            patient.                           - Resume previous diet today.                           - Continue present medications.                           - Repeat colonoscopy for surveillance based on                            pathology results. Procedure Code(s):        --- Professional ---                           [phone], Colonoscopy, flexible; with biopsy, single                            or multiple                           99152, Moderate sedation services provided by the                            same physician or other qualified health care  professional performing the diagnostic or                            therapeutic service that the sedation supports,                            requiring the presence of an independent trained                            observer to assist in the monitoring of the                            patient's level of consciousness and physiological                            status; initial 15 minutes of intraservice time,                            patient age 57 years or older                           [phone], Moderate sedation services; each additional                            15 minutes intraservice time Diagnosis Code(s):        --- Professional ---                           Z12.11, Encounter for screening for malignant                            neoplasm of colon                           D12.0, Benign neoplasm of cecum                           D12.2, Benign neoplasm of ascending colon                           K64.4, Residual hemorrhoidal skin tags CPT copyright 2016 American Medical Association. All rights reserved. The  codes documented in this report are preliminary and upon coder review may  be revised to meet current compliance requirements. Hildred Laser, MD Hildred Laser, MD 12/13/2015 11:09:06 AM This report has been signed electronically. Number of Addenda: 0

## 2015-12-13 NOTE — H&P (Signed)
Cynthia Leon is an 65 y.o. female.   Chief Complaint: Patient is here for colonoscopy. HPI: Patient is 65 year old Caucasian female who is in for screening colonoscopy. Last exam was normal 10 years ago. She denies abdominal pain change in bowel habits or rectal bleeding. Family history is negative for CRC but positive for various malignancies. Mother had breast cancer at age 69 and died within year. She lost her brother of lung cancer at age 83 months ago. One sibling had throat cancer and another sibling had bone cancer and one sister had breast cancer.  Past Medical History  Diagnosis Date  . Fibromyalgia   . Head ache   . Allergic rhinitis   . GERD (gastroesophageal reflux disease)   . Hyperlipidemia   . Right hip pain   . Constipation   . Abdominal pain   . PUD (peptic ulcer disease)   . Osteopenia     Past Surgical History  Procedure Laterality Date  . Total hip arthroplasty      Family History  Problem Relation Age of Onset  . Breast cancer Mother   . Coronary artery disease Sister   . Emphysema Father   . COPD Father   . Hypertension Father   . Coronary artery disease Mother   . Esophageal cancer Sister    Social History:  reports that she has never smoked. She does not have any smokeless tobacco history on file. Her alcohol and drug histories are not on file.  Allergies:  Allergies  Allergen Reactions  . Asa [Aspirin]     Medications Prior to Admission  Medication Sig Dispense Refill  . ALPRAZolam (XANAX) 0.5 MG tablet TAKE ONE TABLET BY MOUTH AT BEDTIME AS NEEDED 30 tablet 3  . Calcium & Magnesium Carbonates (MYLANTA PO) Take by mouth as needed.      . Cholecalciferol (VITAMIN D) 2000 UNITS CAPS Take by mouth daily.      . cyclobenzaprine (FLEXERIL) 10 MG tablet TAKE 1 TABLET 3 TIMES A DAY AS NEEDED FOR MUSCLE SPASMS 60 tablet 1  . fluticasone (FLONASE) 50 MCG/ACT nasal spray Place 2 sprays into both nostrils daily. 16 g 6  . Multiple  Vitamins-Minerals (MULTIVITAMIN WITH MINERALS) tablet Take 1 tablet by mouth daily.    . naproxen (NAPROSYN) 500 MG tablet Take 1 tablet (500 mg total) by mouth 2 (two) times daily with a meal. 60 tablet 1  . omeprazole-sodium bicarbonate (ZEGERID) 40-1100 MG capsule TAKE ONE CAPSULE BY MOUTH TWICE DAILY 180 capsule 1  . polyethylene glycol-electrolytes (NULYTELY/GOLYTELY) 420 g solution Take 4,000 mLs by mouth once. 4000 mL 0  . predniSONE (DELTASONE) 5 MG tablet Take 5 mg by mouth daily.  0  . rosuvastatin (CRESTOR) 20 MG tablet TAKE 1 TABLET (20 MG TOTAL) BY MOUTH DAILY. 30 tablet 5  . senna (SENOKOT) 8.6 MG tablet Take 1 tablet by mouth daily.    Marland Kitchen topiramate (TOPAMAX) 50 MG tablet TAKE ONE TABLET BY MOUTH TWICE DAILY 180 tablet 0  . SAVELLA 50 MG TABS tablet TAKE ONE TABLET BY MOUTH TWICE DAILY 60 tablet 1    No results found for this or any previous visit (from the past 48 hour(s)). No results found.  ROS  Blood pressure 130/69, pulse 101, temperature 98.5 F (36.9 C), temperature source Oral, resp. rate 7, height 5\' 5"  (1.651 m), weight 230 lb (104.327 kg), SpO2 97 %. Physical Exam  Constitutional: She appears well-developed and well-nourished.  HENT:  Mouth/Throat: Oropharynx is clear and  moist.  Eyes: Conjunctivae are normal. No scleral icterus.  Neck: No thyromegaly present.  Cardiovascular: Normal rate, regular rhythm and normal heart sounds.   No murmur heard. Respiratory: Effort normal and breath sounds normal.  GI: Soft. She exhibits no distension and no mass. There is no tenderness.  Musculoskeletal: She exhibits no edema.  Lymphadenopathy:    She has no cervical adenopathy.  Neurological: She is alert.  Skin: Skin is warm and dry.     Assessment/Plan Average risk screening colonoscopy.  Rogene Houston, MD 12/13/2015, 10:15 AM

## 2015-12-15 ENCOUNTER — Encounter (HOSPITAL_COMMUNITY): Payer: Self-pay | Admitting: Internal Medicine

## 2015-12-19 DIAGNOSIS — M7989 Other specified soft tissue disorders: Secondary | ICD-10-CM | POA: Diagnosis not present

## 2015-12-19 DIAGNOSIS — M1831 Unilateral post-traumatic osteoarthritis of first carpometacarpal joint, right hand: Secondary | ICD-10-CM | POA: Diagnosis not present

## 2015-12-19 DIAGNOSIS — M19041 Primary osteoarthritis, right hand: Secondary | ICD-10-CM | POA: Diagnosis not present

## 2015-12-19 DIAGNOSIS — M79644 Pain in right finger(s): Secondary | ICD-10-CM | POA: Diagnosis not present

## 2015-12-22 NOTE — Progress Notes (Signed)
Ultrasound results reviewed with patient's family member Clarene Critchley. Gallbladder ascites has decreased. Therefore she does not have cystic duct obstruction. Will monitor.

## 2015-12-26 ENCOUNTER — Other Ambulatory Visit: Payer: Self-pay | Admitting: Family

## 2016-01-25 ENCOUNTER — Other Ambulatory Visit: Payer: Self-pay | Admitting: Family

## 2016-01-26 NOTE — Telephone Encounter (Signed)
Last filled 12/23/15, last seen 11/09/15. Route to pool, call in at [facility]

## 2016-01-26 NOTE — Telephone Encounter (Signed)
rx called into pharmacy and pt is aware. 

## 2016-02-05 ENCOUNTER — Other Ambulatory Visit: Payer: Self-pay | Admitting: Family

## 2016-02-07 ENCOUNTER — Other Ambulatory Visit: Payer: Self-pay | Admitting: Family

## 2016-02-23 ENCOUNTER — Other Ambulatory Visit: Payer: Self-pay | Admitting: Family

## 2016-03-09 ENCOUNTER — Other Ambulatory Visit: Payer: Self-pay | Admitting: Family

## 2016-03-10 ENCOUNTER — Other Ambulatory Visit: Payer: Self-pay | Admitting: Family

## 2016-03-11 ENCOUNTER — Ambulatory Visit: Payer: PRIVATE HEALTH INSURANCE | Admitting: Family

## 2016-03-12 ENCOUNTER — Telehealth: Payer: Self-pay | Admitting: Family

## 2016-03-18 ENCOUNTER — Ambulatory Visit (INDEPENDENT_AMBULATORY_CARE_PROVIDER_SITE_OTHER): Payer: PRIVATE HEALTH INSURANCE

## 2016-03-18 ENCOUNTER — Ambulatory Visit (INDEPENDENT_AMBULATORY_CARE_PROVIDER_SITE_OTHER): Payer: PRIVATE HEALTH INSURANCE | Admitting: Family

## 2016-03-18 ENCOUNTER — Encounter: Payer: Self-pay | Admitting: Family

## 2016-03-18 VITALS — BP 133/86 | HR 93 | Temp 98.3°F | Ht 66.0 in | Wt 238.4 lb

## 2016-03-18 DIAGNOSIS — S93401A Sprain of unspecified ligament of right ankle, initial encounter: Secondary | ICD-10-CM | POA: Diagnosis not present

## 2016-03-18 DIAGNOSIS — M25571 Pain in right ankle and joints of right foot: Secondary | ICD-10-CM | POA: Diagnosis not present

## 2016-03-18 MED ORDER — CYCLOBENZAPRINE HCL 10 MG PO TABS: ORAL_TABLET | ORAL | 2 refills | Status: DC

## 2016-03-18 MED ORDER — OMEPRAZOLE 40 MG PO CPDR: 40.0000 mg | DELAYED_RELEASE_CAPSULE | Freq: Every day | ORAL | 3 refills | Status: DC

## 2016-03-18 MED ORDER — PREDNISONE 10 MG (21) PO TBPK: ORAL_TABLET | ORAL | 0 refills | Status: DC

## 2016-03-18 MED ORDER — NAPROXEN 500 MG PO TABS: 500.0000 mg | ORAL_TABLET | Freq: Two times a day (BID) | ORAL | 1 refills | Status: DC

## 2016-03-18 NOTE — Addendum Note (Signed)
Addended by: Evelina Dun A on: 03/18/2016 01:03 PM   Modules accepted: Orders

## 2016-03-18 NOTE — Patient Instructions (Signed)
Acute Ankle Sprain With Phase II Rehab An acute ankle sprain is a partial or complete tear in one or more of the ligaments of the ankle due to traumatic injury. The severity of the injury depends on both the number of ligaments sprained and the grade of sprain. There are 3 grades of sprains.  A grade 1 sprain is a mild sprain. There is a slight pull without obvious tearing. There is no loss of strength, and the muscle and ligament are the correct length.  A grade 2 sprain is a moderate sprain. There is tearing of fibers within the substance of the ligament where it connects two bones or two cartilages. The length of the ligament is increased, and there is usually decreased strength.  A grade 3 sprain is a complete rupture of the ligament and is uncommon. In addition to the grade of sprain, there are 3 types of ankle sprains.  Lateral ankle sprains. This is a sprain of one or more of the 3 ligaments on the outer side (lateral) of the ankle. These are the most common sprains. Medial ankle sprains. There is one large triangular ligament on the inner side (medial) of the ankle that is susceptible to injury. Medial ankle sprains are less common. Syndesmosis, "high ankle," sprains. The syndesmosis is the ligament that connects the two bones of the lower leg. Syndesmosis sprains usually only occur with very severe ankle sprains. SYMPTOMS  Pain, tenderness, and swelling in the ankle, starting at the side of injury that may progress to the whole ankle and foot with time.  "Pop" or tearing sensation at the time of injury.  Bruising that may spread to the heel.  Impaired ability to walk soon after injury. CAUSES   Acute ankle sprains are caused by trauma placed on the ankle that temporarily forces or pries the anklebone (talus) out of its normal socket.  Stretching or tearing of the ligaments that normally hold the joint in place (usually due to a twisting injury). RISK INCREASES WITH:  Previous  ankle sprain.  Sports in which the foot may land awkwardly (basketball, volleyball, soccer) or walking or running on uneven or rough surfaces.  Shoes with inadequate support to prevent sideways motion when stress occurs.  Poor strength and flexibility.  Poor balance skills.  Contact sports. PREVENTION  Warm up and stretch properly before activity.  Maintain physical fitness:  Ankle and leg flexibility, muscle strength, and endurance.  Cardiovascular fitness.  Balance training activities.  Use proper technique and have a coach correct improper technique.  Taping, protective strapping, bracing, or high-top tennis shoes may help prevent injury. Initially, tape is best. However, it loses most of its support function within 10 to 15 minutes.  Wear proper fitted protective shoes. Combining high-top shoes with taping or bracing is more effective than using either alone.  Provide the ankle with support during sports and practice activities for 12 months following injury. PROGNOSIS   If treated properly, ankle sprains can be expected to recover completely. However, the length of recovery depends on the degree of injury.  A grade 1 sprain usually heals enough in 5 to 7 days to allow modified activity and requires an average of 6 weeks to heal completely.  A grade 2 sprain requires 6 to 10 weeks to heal completely.  A grade 3 sprain requires 12 to 16 weeks to heal.  A syndesmosis sprain often takes more than 3 months to heal. RELATED COMPLICATIONS   Frequent recurrence of symptoms may result   in a chronic problem. Appropriately addressing the problem the first time decreases the frequency of recurrence and optimizes healing time. Severity of initial sprain does not predict the likelihood of later instability.  Injury to other structures (bone, cartilage, or tendon).  Chronically unstable or arthritic ankle joint are possible with repeated sprains. TREATMENT Treatment initially  involves the use of ice, medicine, and compression bandages to help reduce pain and inflammation. Ankle sprains are usually immobilized in a walking cast or boot to allow for healing. Crutches may be recommended to reduce pressure on the injury. After immobilization, strengthening and stretching exercises may be necessary to regain strength and a full range of motion. Surgery is rarely needed to treat ankle sprains. MEDICATION   Nonsteroidal anti-inflammatory medicines, such as aspirin and ibuprofen (do not take for the first 3 days after injury or within 7 days before surgery), or other minor pain relievers, such as acetaminophen, are often recommended. Take these as directed by your caregiver. Contact your caregiver immediately if any bleeding, stomach upset, or signs of an allergic reaction occur from these medicines.  Ointments applied to the skin may be helpful.  Pain relievers may be prescribed as necessary by your caregiver. Do not take prescription pain medicine for longer than 4 to 7 days. Use only as directed and only as much as you need. HEAT AND COLD  Cold treatment (icing) is used to relieve pain and reduce inflammation for acute and chronic cases. Cold should be applied for 10 to 15 minutes every 2 to 3 hours for inflammation and pain and immediately after any activity that aggravates your symptoms. Use ice packs or an ice massage.  Heat treatment may be used before performing stretching and strengthening activities prescribed by your caregiver. Use a heat pack or a warm soak. SEEK IMMEDIATE MEDICAL CARE IF:   Pain, swelling, or bruising worsens despite treatment.  You experience pain, numbness, discoloration, or coldness in the foot or toes.  New, unexplained symptoms develop. (Drugs used in treatment may produce side effects.) EXERCISES  PHASE II EXERCISES RANGE OF MOTION (ROM) AND STRETCHING EXERCISES - Ankle Sprain, Acute-Phase II, Weeks 3 to 4 After your physician, physical  therapist, or athletic trainer feels your knee has made progress significant enough to begin more advanced exercises, he or she may recommend completing some of the following exercises. Although each person heals at different rates, most people will be ready for these exercises between 3 and 4 weeks after their injury. Do not begin these exercises until you have your caregiver's permission. He or she may also advise you to continue with the exercises which you completed in Phase I of your rehabilitation. While completing these exercises, remember:   Restoring tissue flexibility helps normal motion to return to the joints. This allows healthier, less painful movement and activity.  An effective stretch should be held for at least 30 seconds.  A stretch should never be painful. You should only feel a gentle lengthening or release in the stretched tissue. RANGE OF MOTION - Ankle Plantar Flexion   Sit with your right / left leg crossed over your opposite knee.  Use your opposite hand to pull the top of your foot and toes toward you.  You should feel a gentle stretch on the top of your foot/ankle. Hold this position for __________. Repeat __________ times. Complete __________ times per day.  RANGE OF MOTION - Ankle Eversion  Sit with your right / left ankle crossed over your opposite knee.    Grip your foot with your opposite hand, placing your thumb on the top of your foot and your fingers across the bottom of your foot.  Gently push your foot downward with a slight rotation so your littlest toes rise slightly  You should feel a gentle stretch on the inside of your ankle. Hold the stretch for __________ seconds. Repeat __________ times. Complete this exercise __________ times per day.  RANGE OF MOTION - Ankle Inversion  Sit with your right / left ankle crossed over your opposite knee.  Grip your foot with your opposite hand, placing your thumb on the bottom of your foot and your fingers across  the top of your foot.  Gently pull your foot so the smallest toe comes toward you and your thumb pushes the inside of the ball of your foot away from you.  You should feel a gentle stretch on the outside of your ankle. Hold the stretch for __________ seconds. Repeat __________ times. Complete this exercise __________ times per day.  STRETCH - Gastrocsoleus  Sit with your right / left leg extended. Holding onto both ends of a belt or towel, loop it around the ball of your foot.  Keeping your right / left ankle and foot relaxed and your knee straight, pull your foot and ankle toward you using the belt/towel.  You should feel a gentle stretch behind your calf or knee. Hold this position for __________ seconds. Repeat __________ times. Complete this stretch __________ times per day.  RANGE OF MOTION - Ankle Dorsiflexion, Active Assisted  Remove shoes and sit on a chair that is preferably not on a carpeted surface.  Place right / left foot under knee. Extend your opposite leg for support.  Keeping your heel down, slide your right / left foot back toward the chair until you feel a stretch at your ankle or calf. If you do not feel a stretch, slide your bottom forward to the edge of the chair while still keeping your heel down.  Hold this stretch for __________ seconds. Repeat __________ times. Complete this stretch __________ times per day.  STRETCH - Gastroc, Standing   Place hands on wall.  Extend right / left leg and place a folded washcloth under the arch of your foot for support. Keep the front knee somewhat bent.  Slightly point your toes inward on your back foot.  Keeping your right / left heel on the floor and your knee straight, shift your weight toward the wall, not allowing your back to arch.  You should feel a gentle stretch in the calf. Hold this position for __________ seconds. Repeat __________ times. Complete this stretch __________ times per day. STRETCH - Soleus,  Standing  Place hands on wall.  Extend right / left leg and place a folded washcloth under the arch of your foot for support. Keep the front knee somewhat bent.  Slightly point your toes inward on your back foot.  Keep your right / left heel on the floor, bend your back knee, and slightly shift your weight over the back leg so that you feel a gentle stretch deep in your back calf.  Hold this position for __________ seconds. Repeat __________ times. Complete this stretch __________ times per day. STRETCH - Gastrocsoleus, Standing Note: This exercise can place a lot of stress on your foot and ankle. Please complete this exercise only if specifically instructed by your caregiver.   Place the ball of your right / left foot on a step, keeping your other   foot firmly on the same step.  Hold on to the wall or a rail for balance.  Slowly lift your other foot, allowing your body weight to press your heel down over the edge of the step.  You should feel a stretch in your right / left calf.  Hold this position for __________ seconds.  Repeat this exercise with a slight bend in your knee. Repeat __________ times. Complete this stretch __________ times per day.  STRENGTHENING EXERCISES - Ankle Sprain, Acute-Phase II Around 3 to 4 weeks after your injury, you may progress to some of these exercises in your rehabilitation program. Do not begin these until you have your caregiver's permission. Although your condition has improved, the Phase I exercises will continue to be helpful and you may continue to complete them. As you complete strengthening exercises, remember:   Strong muscles with good endurance tolerate stress better.  Do the exercises as initially prescribed by your caregiver. Progress slowly with each exercise, gradually increasing the number of repetitions and weight used under his or her guidance.  You may experience muscle soreness or fatigue, but the pain or discomfort you are trying  to eliminate should never worsen during these exercises. If this pain does worsen, stop and make certain you are following the directions exactly. If the pain is still present after adjustments, discontinue the exercise until you can discuss the trouble with your caregiver. STRENGTH - Plantar-flexors, Standing  Stand with your feet shoulder width apart. Steady yourself with a wall or table using as little support as needed.  Keeping your weight evenly spread over the width of your feet, rise up on your toes.*  Hold this position for __________ seconds. Repeat __________ times. Complete this exercise __________ times per day.  *If this is too easy, shift your weight toward your right / left leg until you feel challenged. Ultimately, you may be asked to do this exercise with your right / left foot only. STRENGTH - Dorsiflexors and Plantar-flexors, Heel/toe Walking  Dorsiflexion: Walk on your heels only. Keep your toes as high as possible.  Walk for ____________________ seconds/feet.  Repeat __________ times. Complete __________ times per day.  Plantar flexion: Walk on your toes only. Keep your heels as high as possible.  Walk for ____________________ seconds/feet. Repeat __________ times. Complete __________ times per day.  BALANCE - Tandem Walking  Place your uninjured foot on a line 2 to 4 inches wide and at least 10 feet long.  Keeping your balance without using anything for extra support, place your right / left heel directly in front of your other foot.  Slowly raise your back foot up, lifting from the heel to the toes, and place it directly in front of the right / left foot.  Continue to walk along the line slowly. Walk for ____________________ feet. Repeat ____________________ times. Complete ____________________ times per day. BALANCE - Inversion/Eversion Use caution, these are advanced level exercises. Do not begin them until you are advised to do so.   Create a balance  board using a sturdy board about 1  feet long and at 1 to 1  feet wide and a 1  inch diameter rod or pipe that is as long as the board's width. A copper pipe or a solid broomstick work well.  Stand on a non-carpeted surface near a countertop or wall. Step onto the board so that your feet are hip-width apart and equally straddle the rod/pipe.  Keeping your feet in place, complete these two exercises   without shifting your upper body or hips:  Tip the board from side-to-side. Control the movement so the board does not forcefully strike the ground. The board should silently tap the ground.  Tip the board side-to-side without striking the ground. Occasionally pause and maintain a steady position at various points.  Repeat the first two exercises, but use only your right / left foot. Place your right / left foot directly over the rod/pipe. Repeat __________ times. Complete this exercise __________ times a day. BALANCE - Plantar/Dorsi Flexion Use caution, these are advanced level exercises. Do not begin them until you are advised to do so.   Create a balance board using a sturdy board about 1  feet long and at 1 to 1  feet wide and a 1  inch diameter rod or pipe that is as long as the board's width. A copper pipe or a solid broomstick work well.  Stand on a non-carpeted surface near a countertop or wall. Stand on the board so that the rod/pipe runs under the arches in your feet.  Keeping your feet in place, complete these two exercises without shifting your upper body or hips:  Tip the board from side-to-side. Control the movement so the board does not forcefully strike the ground. The board should silently tap the ground.  Tip the board side-to-side without striking the ground. Occasionally pause and maintain a steady position at various points.  Repeat the first two exercises, but use only your right / left foot. Stand in the center of the board. Repeat __________ times. Complete this  exercise __________ times a day. STRENGTH - Plantar-flexors, Eccentric Note: This exercise can place a lot of stress on your foot and ankle. Please complete this exercise only if specifically instructed by your caregiver.   Place the balls of your feet on a step. With your hands, use only enough support from a wall or rail to keep your balance.  Keep your knees straight and rise up on your toes.  Slowly shift your weight entirely to your toes and pick up your opposite foot. Gently and with controlled movement, lower your weight through your right / left foot so that your heel drops below the level of the step. You will feel a slight stretch in the back of your calf at the ending position.  Use the healthy leg to help rise up onto the balls of both feet, then lower weight only on the right / left leg again. Build up to 15 repetitions. Then progress to 3 consecutive sets of 15 repetitions.*  After completing the above exercise, complete the same exercise with a slight knee bend (about 30 degrees). Again, build up to 15 repetitions. Then progress to 3 consecutive sets of 15 repetitions.* Perform this exercise __________ times per day.  *When you easily complete 3 sets of 15, your physician, physical therapist, or athletic trainer may advise you to add resistance by wearing a backpack filled with additional weight.   This information is not intended to replace advice given to you by your health care provider. Make sure you discuss any questions you have with your health care provider.   Document Released: 11/04/2005 Document Revised: 08/05/2014 Document Reviewed: 10/27/2008 [employer] Interactive Patient Education 2016 [employer]  

## 2016-03-18 NOTE — Progress Notes (Signed)
   Subjective:    Patient ID: Cynthia Leon, female    DOB: [birth year], 65 y.o.   MRN: [id number]  Ankle Pain   The incident occurred more than 1 week ago. The injury mechanism was a twisting injury. The pain is present in the right ankle. The quality of the pain is described as aching. The pain is at a severity of 7/10. The pain is moderate. The pain has been fluctuating since onset. Associated symptoms include numbness. She reports no foreign bodies present. The symptoms are aggravated by movement and weight bearing. She has tried ice, elevation and non-weight bearing for the symptoms. The treatment provided mild relief.      Review of Systems  Eyes: Negative.   Respiratory: Negative.  Negative for shortness of breath.   Cardiovascular: Negative.  Negative for palpitations.  Gastrointestinal: Negative.   Endocrine: Negative.   Genitourinary: Negative.   Musculoskeletal: Positive for arthralgias and joint swelling.  Neurological: Positive for numbness. Negative for headaches.  Hematological: Negative.   Psychiatric/Behavioral: Negative.   All other systems reviewed and are negative.      Objective:   Physical Exam  Constitutional: She is oriented to person, place, and time. She appears well-developed and well-nourished. No distress.  HENT:  Head: Normocephalic.  Cardiovascular: Normal rate, regular rhythm, normal heart sounds and intact distal pulses.   No murmur heard. Pulmonary/Chest: Effort normal and breath sounds normal. No respiratory distress. She has no wheezes.  Abdominal: Soft. Bowel sounds are normal. She exhibits no distension. There is no tenderness.  Musculoskeletal: Normal range of motion. She exhibits edema (trace right ankle ) and tenderness (lateral right ankle with flexion and extension).  Neurological: She is alert and oriented to person, place, and time.  Skin: Skin is warm and dry.  Psychiatric: She has a normal mood and affect. Her behavior is  normal. Judgment and thought content normal.  Vitals reviewed.  X-ray- No fracture Preliminary reading by Evelina Dun, FNP [facility]   BP 133/86   Pulse 93   Temp 98.3 F (36.8 C) (Oral)   Ht 5\' 6"  (1.676 m)   Wt 238 lb 6.4 oz (108.1 kg)   BMI 38.48 kg/m      Assessment & Plan:  1. Right ankle pain - DG Ankle Complete Right; Future  2. Sprain of ankle, right, initial encounter -Rest -ROM exercises -Ice and heat -RTO prn  - naproxen (NAPROSYN) 500 MG tablet; Take 1 tablet (500 mg total) by mouth 2 (two) times daily with a meal.  Dispense: 60 tablet; Refill: 1 - predniSONE (STERAPRED UNI-PAK 21 TAB) 10 MG (21) TBPK tablet; Use as directed  Dispense: 21 tablet; Refill: 0  Evelina Dun, FNP

## 2016-03-27 ENCOUNTER — Other Ambulatory Visit: Payer: Self-pay | Admitting: Family

## 2016-03-28 NOTE — Telephone Encounter (Signed)
rx called into pharmacy

## 2016-04-02 ENCOUNTER — Other Ambulatory Visit: Payer: Self-pay | Admitting: Family

## 2016-04-02 DIAGNOSIS — J0100 Acute maxillary sinusitis, unspecified: Secondary | ICD-10-CM

## 2016-04-13 ENCOUNTER — Other Ambulatory Visit: Payer: Self-pay | Admitting: Family

## 2016-04-24 ENCOUNTER — Ambulatory Visit: Payer: PRIVATE HEALTH INSURANCE | Admitting: Family

## 2016-04-25 ENCOUNTER — Ambulatory Visit (INDEPENDENT_AMBULATORY_CARE_PROVIDER_SITE_OTHER): Payer: PRIVATE HEALTH INSURANCE

## 2016-04-25 ENCOUNTER — Encounter: Payer: Self-pay | Admitting: Family

## 2016-04-25 ENCOUNTER — Ambulatory Visit (INDEPENDENT_AMBULATORY_CARE_PROVIDER_SITE_OTHER): Payer: PRIVATE HEALTH INSURANCE | Admitting: Family

## 2016-04-25 VITALS — BP 130/80 | HR 87 | Temp 97.3°F | Ht 66.0 in | Wt 238.0 lb

## 2016-04-25 DIAGNOSIS — R0602 Shortness of breath: Secondary | ICD-10-CM

## 2016-04-25 DIAGNOSIS — R0683 Snoring: Secondary | ICD-10-CM

## 2016-04-25 DIAGNOSIS — E669 Obesity, unspecified: Secondary | ICD-10-CM

## 2016-04-25 MED ORDER — MILNACIPRAN HCL 50 MG PO TABS: 50.0000 mg | ORAL_TABLET | Freq: Two times a day (BID) | ORAL | 1 refills | Status: DC

## 2016-04-25 MED ORDER — ALPRAZOLAM 0.5 MG PO TABS: 0.5000 mg | ORAL_TABLET | Freq: Every evening | ORAL | 1 refills | Status: DC | PRN

## 2016-04-25 NOTE — Patient Instructions (Signed)

## 2016-04-25 NOTE — Progress Notes (Signed)
   Subjective:    Patient ID: Cynthia Leon, female    DOB: May 14, 1951, 65 y.o.   MRN: [id number]  Pt presents to the office today with SOB with exertion. PT states this has progressively become worse. PT states she does snore at night.  Shortness of Breath  This is a new problem. The current episode started more than 1 month ago. The problem occurs intermittently. The problem has been waxing and waning. Associated symptoms include leg swelling, orthopnea, rhinorrhea, sputum production and wheezing. Pertinent negatives include no chest pain, coryza, ear pain, fever or leg pain. The symptoms are aggravated by pollens. She has tried rest for the symptoms. The treatment provided mild relief. There is no history of asthma, CAD, COPD or a heart failure.      Review of Systems  Constitutional: Negative for fever.  HENT: Positive for rhinorrhea. Negative for ear pain.   Respiratory: Positive for sputum production, shortness of breath and wheezing.   Cardiovascular: Positive for orthopnea and leg swelling. Negative for chest pain.  All other systems reviewed and are negative.      Objective:   Physical Exam  Constitutional: She is oriented to person, place, and time. She appears well-developed and well-nourished. No distress.  HENT:  Head: Normocephalic and atraumatic.  Right Ear: External ear normal.  Left Ear: External ear normal.  Mouth/Throat: Oropharynx is clear and moist.  Nasal passage erythemas with mild swelling   Eyes: Pupils are equal, round, and reactive to light.  Neck: Normal range of motion. Neck supple. No thyromegaly present.  Cardiovascular: Normal rate, regular rhythm, normal heart sounds and intact distal pulses.   No murmur heard. Pulmonary/Chest: Effort normal and breath sounds normal. No respiratory distress. She has no wheezes.  Abdominal: Soft. Bowel sounds are normal. She exhibits no distension. There is no tenderness.  Musculoskeletal: Normal range of  motion. She exhibits edema (trace BLE). She exhibits no tenderness.  Neurological: She is alert and oriented to person, place, and time. She has normal reflexes. No cranial nerve deficit.  Skin: Skin is warm and dry.  Psychiatric: She has a normal mood and affect. Her behavior is normal. Judgment and thought content normal.  Vitals reviewed.   Chest X-ray- WNL Preliminary reading by Evelina Dun, FNP [facility]   BP 130/80   Pulse 87   Temp 97.3 F (36.3 C) (Oral)   Ht '5\' 6"'$  (1.676 m)   Wt 238 lb (108 kg)   SpO2 96%   BMI 38.41 kg/m      Assessment & Plan:  1. Short of breath on exertion - DG Chest 2 View; Future - CMP14+EGFR - Brain natriuretic peptide - CBC with Differential/Platelet  2. Obesity (BMI 35.0-39.9 without comorbidity) ([place]) - CMP14+EGFR  3. Snoring - CMP14+EGFR - Ambulatory referral to Sleep Studies    Discussed weight loss and exercising Sleep study ordered and pending Labs pending RTO as needed  Evelina Dun, FNP

## 2016-04-26 LAB — CBC WITH DIFFERENTIAL/PLATELET
BASOS ABS: 0.1 10*3/uL (ref 0.0–0.2)
Basos: 1 %
EOS (ABSOLUTE): 0.4 10*3/uL (ref 0.0–0.4)
Eos: 4 %
Hematocrit: 40.8 % (ref 34.0–46.6)
Hemoglobin: 13.3 g/dL (ref 11.1–15.9)
Immature Grans (Abs): 0 10*3/uL (ref 0.0–0.1)
Immature Granulocytes: 0 %
LYMPHS ABS: 2.3 10*3/uL (ref 0.7–3.1)
LYMPHS: 28 %
MCH: 27.6 pg (ref 26.6–33.0)
MCHC: 32.6 g/dL (ref 31.5–35.7)
MCV: 85 fL (ref 79–97)
MONOCYTES: 7 %
MONOS ABS: 0.6 10*3/uL (ref 0.1–0.9)
NEUTROS ABS: 5 10*3/uL (ref 1.4–7.0)
Neutrophils: 60 %
PLATELETS: 292 10*3/uL (ref 150–379)
RBC: 4.82 x10E6/uL (ref 3.77–5.28)
RDW: 14 % (ref 12.3–15.4)
WBC: 8.3 10*3/uL (ref 3.4–10.8)

## 2016-04-26 LAB — CMP14+EGFR
ALBUMIN: 4.2 g/dL (ref 3.6–4.8)
ALK PHOS: 94 IU/L (ref 39–117)
ALT: 22 IU/L (ref 0–32)
AST: 14 IU/L (ref 0–40)
Albumin/Globulin Ratio: 1.6 (ref 1.2–2.2)
BUN / CREAT RATIO: 11 — AB (ref 12–28)
BUN: 8 mg/dL (ref 8–27)
Bilirubin Total: <0.2 mg/dL (ref 0.0–1.2)
CO2: 25 mmol/L (ref 18–29)
CREATININE: 0.7 mg/dL (ref 0.57–1.00)
Calcium: 9.8 mg/dL (ref 8.7–10.3)
Chloride: 99 mmol/L (ref 96–106)
GFR, EST AFRICAN AMERICAN: 105 mL/min/{1.73_m2} (ref >59)
GFR, EST NON AFRICAN AMERICAN: 91 mL/min/{1.73_m2} (ref >59)
GLOBULIN, TOTAL: 2.6 g/dL (ref 1.5–4.5)
GLUCOSE: 118 mg/dL — AB (ref 65–99)
Potassium: 4.4 mmol/L (ref 3.5–5.2)
SODIUM: 139 mmol/L (ref 134–144)
TOTAL PROTEIN: 6.8 g/dL (ref 6.0–8.5)

## 2016-04-26 LAB — BRAIN NATRIURETIC PEPTIDE: BNP: 5.5 pg/mL (ref 0.0–100.0)

## 2016-04-29 ENCOUNTER — Other Ambulatory Visit: Payer: Self-pay | Admitting: Family

## 2016-04-30 ENCOUNTER — Other Ambulatory Visit: Payer: Self-pay | Admitting: *Deleted

## 2016-05-03 ENCOUNTER — Other Ambulatory Visit: Payer: Self-pay | Admitting: Family

## 2016-05-12 ENCOUNTER — Other Ambulatory Visit: Payer: Self-pay | Admitting: Family

## 2016-05-14 ENCOUNTER — Encounter: Payer: Self-pay | Admitting: *Deleted

## 2016-05-25 ENCOUNTER — Other Ambulatory Visit: Payer: Self-pay | Admitting: Family

## 2016-05-25 DIAGNOSIS — S93401A Sprain of unspecified ligament of right ankle, initial encounter: Secondary | ICD-10-CM

## 2016-05-27 ENCOUNTER — Telehealth: Payer: Self-pay | Admitting: Family

## 2016-05-27 NOTE — Telephone Encounter (Signed)
Patient was concerned because she thought the Naprosyn was a muscle relaxant and she has not been taking it because she was on Flexeril already.  I advised the patient that Naprosyn is for inflammation and recommended that she take it with food.  Patient voices understanding.

## 2016-06-03 ENCOUNTER — Other Ambulatory Visit: Payer: Self-pay | Admitting: *Deleted

## 2016-06-03 MED ORDER — ROSUVASTATIN CALCIUM 20 MG PO TABS: 20.0000 mg | ORAL_TABLET | Freq: Every day | ORAL | 0 refills | Status: DC

## 2016-06-03 MED ORDER — OMEPRAZOLE 40 MG PO CPDR: 40.0000 mg | DELAYED_RELEASE_CAPSULE | Freq: Every day | ORAL | 0 refills | Status: DC

## 2016-06-06 ENCOUNTER — Other Ambulatory Visit: Payer: Self-pay | Admitting: Family

## 2016-06-28 ENCOUNTER — Telehealth: Payer: Self-pay | Admitting: Family

## 2016-07-12 ENCOUNTER — Other Ambulatory Visit: Payer: Self-pay | Admitting: Family

## 2016-07-21 ENCOUNTER — Other Ambulatory Visit: Payer: Self-pay | Admitting: Family

## 2016-08-07 ENCOUNTER — Other Ambulatory Visit: Payer: Self-pay | Admitting: Family

## 2016-08-08 NOTE — Telephone Encounter (Signed)
Patient NTBS for follow up and lab work. Please call medication in for one month.

## 2016-08-08 NOTE — Telephone Encounter (Signed)
Last filled 07/03/2016. Last seen 04/25/2016. If approved please route to pool for nurse to call into pharmacy

## 2016-08-08 NOTE — Telephone Encounter (Signed)
rx called into pharmacy and pt is aware. Pt also scheduled appt with [facility] for follow up.

## 2016-08-19 ENCOUNTER — Other Ambulatory Visit: Payer: Self-pay | Admitting: Family

## 2016-08-20 ENCOUNTER — Ambulatory Visit (INDEPENDENT_AMBULATORY_CARE_PROVIDER_SITE_OTHER): Payer: PRIVATE HEALTH INSURANCE | Admitting: Family

## 2016-08-20 ENCOUNTER — Encounter: Payer: Self-pay | Admitting: Family

## 2016-08-20 VITALS — BP 122/78 | HR 87 | Temp 97.5°F | Ht 66.0 in | Wt 243.6 lb

## 2016-08-20 DIAGNOSIS — G43009 Migraine without aura, not intractable, without status migrainosus: Secondary | ICD-10-CM | POA: Diagnosis not present

## 2016-08-20 DIAGNOSIS — M25531 Pain in right wrist: Secondary | ICD-10-CM

## 2016-08-20 DIAGNOSIS — K219 Gastro-esophageal reflux disease without esophagitis: Secondary | ICD-10-CM | POA: Diagnosis not present

## 2016-08-20 DIAGNOSIS — E8881 Metabolic syndrome: Secondary | ICD-10-CM | POA: Diagnosis not present

## 2016-08-20 DIAGNOSIS — F411 Generalized anxiety disorder: Secondary | ICD-10-CM

## 2016-08-20 DIAGNOSIS — E559 Vitamin D deficiency, unspecified: Secondary | ICD-10-CM | POA: Diagnosis not present

## 2016-08-20 DIAGNOSIS — K5900 Constipation, unspecified: Secondary | ICD-10-CM | POA: Diagnosis not present

## 2016-08-20 DIAGNOSIS — E669 Obesity, unspecified: Secondary | ICD-10-CM

## 2016-08-20 DIAGNOSIS — M25532 Pain in left wrist: Secondary | ICD-10-CM

## 2016-08-20 DIAGNOSIS — E785 Hyperlipidemia, unspecified: Secondary | ICD-10-CM

## 2016-08-20 DIAGNOSIS — R7309 Other abnormal glucose: Secondary | ICD-10-CM

## 2016-08-20 MED ORDER — NAPROXEN 500 MG PO TABS: 500.0000 mg | ORAL_TABLET | Freq: Two times a day (BID) | ORAL | 5 refills | Status: DC

## 2016-08-20 MED ORDER — CYCLOBENZAPRINE HCL 10 MG PO TABS: ORAL_TABLET | ORAL | 4 refills | Status: DC

## 2016-08-20 MED ORDER — ALPRAZOLAM 0.5 MG PO TABS: 0.5000 mg | ORAL_TABLET | Freq: Every evening | ORAL | 5 refills | Status: DC | PRN

## 2016-08-20 MED ORDER — ROSUVASTATIN CALCIUM 20 MG PO TABS: 20.0000 mg | ORAL_TABLET | Freq: Every day | ORAL | 0 refills | Status: DC

## 2016-08-20 NOTE — Patient Instructions (Signed)

## 2016-08-20 NOTE — Progress Notes (Signed)
Subjective:    Patient ID: Cynthia Leon, female    DOB: Oct 28, 1950, 66 y.o.   MRN: [id number]  Pt presents to the office today for chronic follow up.  Hyperlipidemia  This is a chronic problem. The current episode started more than 1 year ago. The problem is uncontrolled. Recent lipid tests were reviewed and are high (Triglycerides). Exacerbating diseases include obesity. She has no history of diabetes or hypothyroidism. Associated symptoms include leg pain. Current antihyperlipidemic treatment includes statins. The current treatment provides significant improvement of lipids. Risk factors for coronary artery disease include dyslipidemia, family history, obesity and post-menopausal.  Anxiety  Presents for follow-up visit. Symptoms include nervous/anxious behavior. Patient reports no depressed mood, excessive worry, hyperventilation, muscle tension, palpitations or panic. Symptoms occur rarely. The severity of symptoms is mild.    Gastroesophageal Reflux  She complains of belching, coughing and heartburn. She reports no water brash. This is a chronic problem. The current episode started more than 1 year ago. The problem occurs rarely. The problem has been resolved. The symptoms are aggravated by certain foods and lying down. Risk factors include obesity. She has tried a PPI for the symptoms. The treatment provided mild relief.  Migraine   This is a chronic problem. The current episode started more than 1 year ago. Episode frequency: Been at "7-8 months since my last migraine, since starting my sinus meds" The problem has been unchanged. The pain quality is similar to prior headaches. The quality of the pain is described as aching. The pain is at a severity of 6/10. The pain is moderate. Associated symptoms include coughing and numbness. She has tried beta blockers for the symptoms. The treatment provided mild relief. Her past medical history is significant for migraine headaches and obesity.    Constipation  This is a chronic problem. The current episode started more than 1 year ago. The problem has been resolved since onset. Her stool frequency is 1 time per day. Risk factors include obesity. She has tried laxatives and stool softeners for the symptoms. The treatment provided moderate relief.  Arm Pain   There was no injury mechanism. The pain is present in the left forearm. The quality of the pain is described as aching. The pain does not radiate. The pain is at a severity of 8/10. The pain is moderate. The pain has been intermittent since the incident. Associated symptoms include numbness. The symptoms are aggravated by movement. She has tried rest and NSAIDs for the symptoms. The treatment provided mild relief.  Metabolic Syndrome PT states she has gained 20 lbs since her last visit. PT states she does not exercise.     Review of Systems  Constitutional: Negative.   Eyes: Negative.   Respiratory: Positive for cough.   Cardiovascular: Negative.  Negative for palpitations.  Gastrointestinal: Positive for constipation and heartburn.  Endocrine: Negative.   Genitourinary: Negative.   Musculoskeletal: Negative.   Neurological: Positive for numbness.  Hematological: Negative.   Psychiatric/Behavioral: The patient is nervous/anxious.   All other systems reviewed and are negative.      Objective:   Physical Exam  Constitutional: She is oriented to person, place, and time. She appears well-developed and well-nourished. No distress.  HENT:  Head: Normocephalic and atraumatic.  Mouth/Throat: Oropharynx is clear and moist.  Eyes: Pupils are equal, round, and reactive to light.  Neck: Normal range of motion. Neck supple. No thyromegaly present.  Cardiovascular: Normal rate, regular rhythm, normal heart sounds and intact distal pulses.  No murmur heard. Pulmonary/Chest: Effort normal and breath sounds normal. No respiratory distress. She has no wheezes.  Abdominal: Soft. Bowel  sounds are normal. She exhibits no distension. There is no tenderness.  Musculoskeletal: Normal range of motion. She exhibits edema (trace amt in right wrist) and tenderness.  Neurological: She is alert and oriented to person, place, and time. She has normal reflexes. No cranial nerve deficit.  Skin: Skin is warm and dry.  Psychiatric: She has a normal mood and affect. Her behavior is normal. Judgment and thought content normal.  Vitals reviewed.  BP 122/78   Pulse 87   Temp 97.5 F (36.4 C) (Oral)   Ht 5' 6" (1.676 m)   Wt 243 lb 9.6 oz (110.5 kg)   BMI 39.32 kg/m        Assessment & Plan:  1. Gastroesophageal reflux disease, esophagitis presence not specified - CMP14+EGFR  2. Vitamin D deficiency - CMP14+EGFR - VITAMIN D 25 Hydroxy (Vit-D Deficiency, Fractures)  3. Obesity (BMI 35.0-39.9 without comorbidity) - CMP14+EGFR  4. GAD (generalized anxiety disorder) - ALPRAZolam (XANAX) 0.5 MG tablet; Take 1 tablet (0.5 mg total) by mouth at bedtime as needed.  Dispense: 30 tablet; Refill: 5 - CMP14+EGFR  5. Hyperlipidemia, unspecified hyperlipidemia type - CMP14+EGFR - Lipid panel  6. Metabolic syndrome - RWE31+VQMG - Lipid panel  7. Migraine without aura and without status migrainosus, not intractable - CMP14+EGFR  8. Constipation, unspecified constipation type - CMP14+EGFR  9. Pain in both wrists - cyclobenzaprine (FLEXERIL) 10 MG tablet; TAKE 1 TABLET 3 TIMES A DAY AS NEEDED FOR MUSCLE SPASMS  Dispense: 60 tablet; Refill: 4 - naproxen (NAPROSYN) 500 MG tablet; Take 1 tablet (500 mg total) by mouth 2 (two) times daily with a meal.  Dispense: 60 tablet; Refill: 5 - CMP14+EGFR   Continue all meds Labs pending Health Maintenance reviewed Diet and exercise encouraged RTO 6 months   Evelina Dun, FNP

## 2016-08-21 LAB — CMP14+EGFR
ALBUMIN: 4.4 g/dL (ref 3.6–4.8)
ALT: 18 IU/L (ref 0–32)
AST: 10 IU/L (ref 0–40)
Albumin/Globulin Ratio: 1.9 (ref 1.2–2.2)
Alkaline Phosphatase: 83 IU/L (ref 39–117)
BUN / CREAT RATIO: 11 — AB (ref 12–28)
BUN: 9 mg/dL (ref 8–27)
Bilirubin Total: <0.2 mg/dL (ref 0.0–1.2)
CALCIUM: 9.6 mg/dL (ref 8.7–10.3)
CO2: 23 mmol/L (ref 18–29)
CREATININE: 0.84 mg/dL (ref 0.57–1.00)
Chloride: 100 mmol/L (ref 96–106)
GFR calc non Af Amer: 73 mL/min/{1.73_m2} (ref >59)
GFR, EST AFRICAN AMERICAN: 84 mL/min/{1.73_m2} (ref >59)
GLUCOSE: 141 mg/dL — AB (ref 65–99)
Globulin, Total: 2.3 g/dL (ref 1.5–4.5)
Potassium: 4.7 mmol/L (ref 3.5–5.2)
Sodium: 139 mmol/L (ref 134–144)
TOTAL PROTEIN: 6.7 g/dL (ref 6.0–8.5)

## 2016-08-21 LAB — LIPID PANEL
Chol/HDL Ratio: 4 ratio units (ref 0.0–4.4)
Cholesterol, Total: 163 mg/dL (ref 100–199)
HDL: 41 mg/dL (ref >39)
LDL CALC: 71 mg/dL (ref 0–99)
Triglycerides: 254 mg/dL — ABNORMAL HIGH (ref 0–149)
VLDL CHOLESTEROL CAL: 51 mg/dL — AB (ref 5–40)

## 2016-08-21 LAB — VITAMIN D 25 HYDROXY (VIT D DEFICIENCY, FRACTURES): VIT D 25 HYDROXY: 19.3 ng/mL — AB (ref 30.0–100.0)

## 2016-08-22 ENCOUNTER — Other Ambulatory Visit: Payer: Self-pay | Admitting: Family

## 2016-08-22 MED ORDER — VITAMIN D (ERGOCALCIFEROL) 1.25 MG (50000 UNIT) PO CAPS: 50000.0000 [IU] | ORAL_CAPSULE | ORAL | 3 refills | Status: DC

## 2016-08-22 NOTE — Addendum Note (Signed)
Addended by: Shelbie Ammons on: 08/22/2016 12:11 PM   Modules accepted: Orders

## 2016-08-26 ENCOUNTER — Ambulatory Visit (INDEPENDENT_AMBULATORY_CARE_PROVIDER_SITE_OTHER): Payer: PRIVATE HEALTH INSURANCE | Admitting: Pharmacist

## 2016-08-26 ENCOUNTER — Encounter: Payer: Self-pay | Admitting: Pharmacist

## 2016-08-26 ENCOUNTER — Other Ambulatory Visit: Payer: Self-pay | Admitting: Family

## 2016-08-26 VITALS — BP 124/82 | Ht 66.0 in | Wt 241.0 lb

## 2016-08-26 DIAGNOSIS — E6609 Other obesity due to excess calories: Secondary | ICD-10-CM | POA: Diagnosis not present

## 2016-08-26 DIAGNOSIS — Z6839 Body mass index (BMI) 39.0-39.9, adult: Secondary | ICD-10-CM

## 2016-08-26 DIAGNOSIS — R7303 Prediabetes: Secondary | ICD-10-CM | POA: Diagnosis not present

## 2016-08-26 DIAGNOSIS — E119 Type 2 diabetes mellitus without complications: Secondary | ICD-10-CM | POA: Insufficient documentation

## 2016-08-26 DIAGNOSIS — IMO0001 Reserved for inherently not codable concepts without codable children: Secondary | ICD-10-CM

## 2016-08-26 DIAGNOSIS — R7309 Other abnormal glucose: Secondary | ICD-10-CM | POA: Diagnosis not present

## 2016-08-26 LAB — BAYER DCA HB A1C WAIVED: HB A1C (BAYER DCA - WAIVED): 6.4 % (ref <7.0)

## 2016-08-26 MED ORDER — METFORMIN HCL ER 500 MG PO TB24: 500.0000 mg | ORAL_TABLET | Freq: Every day | ORAL | 1 refills | Status: DC

## 2016-08-26 NOTE — Patient Instructions (Signed)
Prediabetes Many people have heard about type 2 diabetes, but its common precursor, prediabetes, doesn't get as much attention. Prediabetes is estimated by CDC to affect 86 million Americans (this includes 51% of people 65 years and older), and an estimated 90% of people with prediabetes don't even know it. According to the CDC, 15-30% of these individuals will develop type 2 diabetes within five years. In other words, as many as 26 million people that currently have prediabetes could develop type 2 diabetes by 2020, effectively doubling the number of people with type 2 diabetes in the [place].  What is prediabetes? Prediabetes is a condition where blood sugar levels are higher than normal, but not high enough to be diagnosed as type 2 diabetes. This occurs when the body has problems in processing glucose properly, and sugar starts to build up in the bloodstream instead of fueling cells in muscles and tissues. Insulin is the hormone that tells cells to take up glucose, and in prediabetes, people typically initially develop insulin resistance (where the body's cells can't respond to insulin as well), and over time (if no actions are taken to reverse the situation) the ability to produce sufficient insulin is reduced. People with prediabetes also commonly have high blood pressure as well as abnormal blood lipids (e.g. cholesterol). These often occur prior to the rise of blood glucose levels.  What are the symptoms of prediabetes? People typically do not have symptoms of prediabetes, which is partially why up to 90% of people don't know they have it. The ADA reports that some people with prediabetes may develop symptoms of type 2 diabetes, though even many people diagnosed with type 2 diabetes show little or no symptoms initially at diagnosis.  How is prediabetes diagnosed? According to the American Diabetes Association, prediabetes can be diagnosed through one of the following tests: 1. A glycated hemoglobin  test, also known as HbA1c or simply A1c, gives an idea of the body's average blood sugar levels from the past two or three months. It is usually done with a small drop of blood from a fingerstick or as part of having blood taken in a doctor's office, hospital, or laboratory. A1c Level Diagnosis  Less than 5.7% Normal  5.7% to 6.4% Prediabetes  6.5% and higher Diabetes  2. A fasting plasma glucose (FPG) test measures a person's blood glucose level after fasting (not eating) for eight hours - this is typically done in the morning. If a test shows positive for prediabetes, a second test should be taken on a different day to confirm the diagnosis. FPG Level Diagnosis  Less than 100 mg/dl Normal  100 mg/dl to 125 mg/dl Prediabetes  126 mg/dl and higher Diabetes   Who is at risk of developing prediabetes? A well-known paper published in the Lancet in 2010 recommends screening for type 2 diabetes (which would also screen for prediabetes) every 3-5 years in all adults over the age of 45, regardless of other risk factors. Overweight and obese adults (a BMI >25 kg/m2) are also at significantly greater risk for developing prediabetes, as well as people with a family history of type 2 diabetes. According to the CDC, several other factors can have moderate influences on prediabetes risk in addition to age, weight, and family history: People with an African American, Hispanic/Latino, American Indian, Asian American, or Pacific Islander racial or ethnic background. The 2015 ADA Standards of Medical Care recommendations suggest Asian Americans with a BMI of 23 or above be screened for type 2 diabetes.    Women with a history of diabetes during pregnancy ("gestational diabetes") or have given birth to a baby weighing nine pounds or more. People who are physically active fewer than three times a week. The CDC offers a fast, online screening test for evaluating the risk for prediabetes. The ADA also offers a screening  test to assess type 2 diabetes risk. Of course, these tests do not themselves confirm a prediabetes diagnosis, but just if someone may be at higher risk of developing it.  Why do people develop prediabetes? Prediabetes develops through a combination of factors that are still being investigated. For sure, lifestyle factors (food, exercise, stress, sleep) play a role, but family history and genetics certainly do as well. It is easy to assume that prediabetes is the result of being overweight, but the relationship is not that simple. While obesity is one underlying cause of insulin resistance, many overweight individuals may never develop prediabetes or type 2 diabetes, and a minority of people with prediabetes have never been overweight. To make matters worse, it can be increasingly difficult to make healthy choices in today's toxic food environment that steers all of us to make the wrong food choices, and there are many factors that can contribute to weight gain in addition to diet.  Is a prediabetes diagnosis serious? There has been significant debate around the term 'prediabetes,' and whether it should be considered cause for alarm. On the one hand, it serves as a risk factor for type 2 diabetes and a host of other complications, including heart disease, and ultimately prediabetes implies that a degree of metabolic problems have started to occur in the body. On the other hand, it places a diagnosis on many people who may never develop type 2 diabetes. Again, according to the CDC, 15-30% of those with prediabetes will develop type 2 diabetes within five years. However, a 2012 Lancet article cites 5-10% of those with prediabetes each year will also revert back to healthy blood sugars. What's critical is not necessarily the cutoff itself, but where someone falls within the ranges listed above. The level of risk of developing type 2 diabetes is closely related to A1c or FPG at diagnosis. Those in the higher ranges  (A1c closer to 6.4%, FPG closer to 125 mg/dl) are much more likely to progress to type 2 diabetes, whereas those at lower ranges (A1c closer to 5.7%, FPG closer to 100 mg/dl) are relatively more likely to revert back to normal glucose levels or stay within the prediabetes range. Age of diagnosis and the level of insulin production still occurring at diagnosis also impact the chances of reverting to normoglycemia (normal blood sugar levels).  What can people with prediabetes do to avoid the progression from prediabetes to type 2 diabetes? The most important action people diagnosed with prediabetes can take is to focus on living a healthy lifestyle. This includes making healthy food choices, controlling portions, and increasing physical activity. Regarding weight control, research shows losing 5-7% (often about 10-20 lbs.) from your initial body weight and keeping off as much of that weight over time as possible is critical to lowering the risk of type 2 diabetes. This task is of course easier said than done, but sustained weight loss over time can be key to improving health and delaying or preventing the onset of type 2 diabetes. Several prediabetes interventions exist based on evidence from the landmark Diabetes Prevention Program (DPP) study. The DPP study reported that moderate weight loss (5-7% of body weight, or ~10-15 lbs.   for someone weighing 200 lbs.), counseling, and education on healthy eating and behavior reduced the risk of developing type 2 diabetes by 58%. Data presented at the ADA 2014 conference showed that after 15 years of follow-up of the DPP study groups, the results were still encouraging: 27% of those in the original lifestyle group had a significant reduction in type 2 diabetes progression compared to the control group. If you or someone you know has been told they have prediabetes, here are a few helpful resources: In-person diabetes prevention programs: The CDC offers a one year long  lifestyle change program through its National Diabetes Prevention Program (NDPP) at various locations throughout the [place] to help participants adopt healthy habits and prevent or delay progression to type 2 diabetes. This program is a major undertaking by the CDC to translate the findings from the DPP study into a real world setting, a significant effort indeed! Online diabetes prevention programs: The CDC has now given pending recognition status to three digital prevention programs: [facility], [facility], and [facility]. These offer the same one year long educational curriculum as the DPP study, but in an online format. Some insurance companies and employers cover these programs, and you can find more information at the links above. These digital versions are excellent options for those who live far away from NDPP locations or who prefer the anonymity and convenience of doing the program online. Metformin: The DPP study found that metformin, the safest first-line therapy for type 2 diabetes, may help delay the onset of type 2 diabetes in people with prediabetes. Participants who took the low-cost generic drug had a 31% reduced risk of developing type 2 diabetes compared to the control group (those not on metformin or intensive lifestyle intervention). Again, 15-year follow up data showed that 17% of those on metformin continued to have a significant reduction in type 2 progression. At this time, metformin (or any other medication, for that matter) is not currently FDA approved for prediabetes, and it is sometimes prescribed "off-label" by a healthcare provider. Your healthcare provider can give you more information and determine whether metformin is a good option for you.  Can prediabetes be "cured"? In the early stages of prediabetes (and type 2 diabetes), diligent attention to food choices and activity, and most importantly weight loss, can improve blood sugar numbers, effectively "reversing" the disease  and reducing the odds of developing type 2 diabetes. However, some people may have underlying factors (such as family history and genetics) that put them at a greater risk of type 2 diabetes, meaning they will always require careful attention to blood sugar levels and lifestyle choices. Returning to old habits will likely put someone back on the road to prediabetes, and eventually, type 2 diabetes   
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
## 2016-08-27 ENCOUNTER — Telehealth: Payer: Self-pay | Admitting: Family

## 2016-08-27 NOTE — Telephone Encounter (Signed)
A1c of 6.4% or less is prediabetic A1c of 6.5% or above is diabetic.  I woud recommend holding off on metformin for now

## 2016-08-27 NOTE — Telephone Encounter (Signed)
Shirley aware of recommendations.

## 2016-08-27 NOTE — Telephone Encounter (Signed)
Lab results were called to patient after she saw Tammy Eckard yesterday. She is confused if she is pre-diabetic or diabetic and should she start metformin or not.

## 2016-09-16 ENCOUNTER — Other Ambulatory Visit: Payer: Self-pay | Admitting: Family

## 2016-09-19 ENCOUNTER — Other Ambulatory Visit: Payer: Self-pay | Admitting: Family

## 2016-09-19 DIAGNOSIS — J0100 Acute maxillary sinusitis, unspecified: Secondary | ICD-10-CM

## 2016-09-26 ENCOUNTER — Ambulatory Visit (INDEPENDENT_AMBULATORY_CARE_PROVIDER_SITE_OTHER): Payer: Medicare Other | Admitting: Pharmacist

## 2016-09-26 ENCOUNTER — Encounter: Payer: Self-pay | Admitting: Pharmacist

## 2016-09-26 VITALS — BP 110/78 | HR 82 | Ht 66.0 in | Wt 229.8 lb

## 2016-09-26 DIAGNOSIS — IMO0001 Reserved for inherently not codable concepts without codable children: Secondary | ICD-10-CM

## 2016-09-26 DIAGNOSIS — Z6839 Body mass index (BMI) 39.0-39.9, adult: Secondary | ICD-10-CM

## 2016-09-26 DIAGNOSIS — R7303 Prediabetes: Secondary | ICD-10-CM

## 2016-09-26 DIAGNOSIS — E6609 Other obesity due to excess calories: Secondary | ICD-10-CM | POA: Diagnosis not present

## 2016-09-26 MED ORDER — OMEPRAZOLE-SODIUM BICARBONATE 40-1100 MG PO CAPS: 1.0000 | ORAL_CAPSULE | Freq: Every day | ORAL | 2 refills | Status: DC

## 2016-09-26 NOTE — Patient Instructions (Signed)
Continue with diet changes - Great Job! You have lost 12 pounds so far.   Try to start walking - goal is 150 minutes each week.
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
## 2016-11-01 ENCOUNTER — Other Ambulatory Visit: Payer: Self-pay | Admitting: Family

## 2016-11-08 ENCOUNTER — Telehealth: Payer: Self-pay | Admitting: Family

## 2016-11-08 NOTE — Telephone Encounter (Signed)
Please review and advise.

## 2016-11-08 NOTE — Telephone Encounter (Signed)
RX dept took care of it

## 2016-11-09 MED ORDER — GABAPENTIN 300 MG PO CAPS: 300.0000 mg | ORAL_CAPSULE | Freq: Three times a day (TID) | ORAL | 3 refills | Status: DC

## 2016-11-09 MED ORDER — PANTOPRAZOLE SODIUM 40 MG PO TBEC: 40.0000 mg | DELAYED_RELEASE_TABLET | Freq: Every day | ORAL | 3 refills | Status: DC

## 2016-11-09 NOTE — Telephone Encounter (Signed)
Aware. 

## 2016-11-09 NOTE — Telephone Encounter (Signed)
Savella changed to gabapentin 300 mg TID per insurance. Zegerid changed to prontonix. - Avoid fried, spicy, citrus foods, caffeine and alcohol,Do not eat 2-3 hours before bedtime  Follow up to recheck in 1 month

## 2016-11-25 ENCOUNTER — Ambulatory Visit (INDEPENDENT_AMBULATORY_CARE_PROVIDER_SITE_OTHER): Payer: Medicare Other | Admitting: Pharmacist

## 2016-11-25 ENCOUNTER — Encounter: Payer: Self-pay | Admitting: Pharmacist

## 2016-11-25 VITALS — BP 122/78 | HR 74 | Ht 66.0 in | Wt 217.5 lb

## 2016-11-25 DIAGNOSIS — R7303 Prediabetes: Secondary | ICD-10-CM

## 2016-11-25 DIAGNOSIS — Z0000 Encounter for general adult medical examination without abnormal findings: Secondary | ICD-10-CM | POA: Diagnosis not present

## 2016-11-25 DIAGNOSIS — Z23 Encounter for immunization: Secondary | ICD-10-CM

## 2016-11-25 LAB — BAYER DCA HB A1C WAIVED: HB A1C (BAYER DCA - WAIVED): 6.2 % (ref <7.0)

## 2016-11-25 MED ORDER — OMEPRAZOLE-SODIUM BICARBONATE 20-1100 MG PO CAPS: 1.0000 | ORAL_CAPSULE | Freq: Every day | ORAL | 0 refills | Status: DC

## 2016-11-25 NOTE — Progress Notes (Signed)
Patient ID: Cynthia Leon, female   DOB: September 30, 1950, 66 y.o.   MRN: [id number]     Subjective:   Cynthia Leon is a 66 y.o. female who presents for an Initial Medicare Annual Wellness Visit. [name] also has pre diabetes and I have met with her twice to discuss diet and TLC.   [name] is married.  She has 3 adult daughters.  Two live in [place] and 1 lives in [place] state. One of her daughters is present with her today and usually bring patient to medical visits.  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    Patient is very excited about her weight loss over the last 4 months - she has lost about 24 lbs.   Regarding her health she is concerned about two things which are both related to her medication therapy.  Her insurance did not cover Savella so patient stopped and was switched to gabapentin.  [name] reports that she has been feeling more anxious and pain is not as well controlled with medication that was prescribed in place of [place] which was gabapentin.  Patient's daughter voices some concerns about her taking gabapentin.  Patient has tried SSRIs in past such as sertraline and fluoxetine which she could not take either.   She also mentions that her insurance denied Zegrid and she was switched to pantoprazole.  She does not feel that pantoprazole is helping as much as Zegrid did.   Current Medications (verified) Outpatient Encounter Prescriptions as of 11/25/2016  Medication Sig  . ALPRAZolam (XANAX) 0.5 MG tablet Take 1 tablet (0.5 mg total) by mouth at bedtime as needed.  . Calcium & Magnesium Carbonates (MYLANTA PO) Take by mouth as needed.    . cyclobenzaprine (FLEXERIL) 10 MG tablet TAKE 1 TABLET 3 TIMES A DAY AS NEEDED FOR MUSCLE SPASMS  . docusate sodium (COLACE) 100 MG capsule Take 100 mg by mouth 2 (two) times daily.  . fluticasone (FLONASE) 50 MCG/ACT nasal spray PLACE 2 SPRAYS  INTO BOTH NOSTRILS DAILY.  Marland Kitchen gabapentin (NEURONTIN) 300 MG capsule Take 1 capsule (300 mg total) by mouth 3 (three) times daily.  . Multiple Vitamins-Minerals (MULTIVITAMIN WITH MINERALS) tablet Take 1 tablet by mouth daily.  . naproxen (NAPROSYN) 500 MG tablet Take 1 tablet (500 mg total) by mouth 2 (two) times daily with a meal.  . rosuvastatin (CRESTOR) 20 MG tablet Take 1 tablet (20 mg total) by mouth daily.  Marland Kitchen topiramate (TOPAMAX) 50 MG tablet TAKE ONE TABLET BY MOUTH TWICE DAILY  . Vitamin D, Ergocalciferol, (DRISDOL) 50000 units CAPS capsule Take 1 capsule (50,000 Units total) by mouth every 7 (seven) days.  . [DISCONTINUED] pantoprazole (PROTONIX) 40 MG tablet Take 1 tablet (40 mg total) by mouth daily.  Earney Navy Bicarbonate (ZEGERID OTC) 20-1100 MG CAPS capsule Take 1-2 capsules by mouth daily before breakfast.  . [DISCONTINUED] Cholecalciferol (VITAMIN D) 2000 UNITS CAPS Take by mouth daily.    . [DISCONTINUED] metFORMIN (GLUCOPHAGE XR) 500 MG 24 hr tablet Take 1 tablet (500 mg total) by mouth daily with breakfast. (Patient not taking: Reported on 11/25/2016)  . [DISCONTINUED] polyethylene glycol-electrolytes (NULYTELY/GOLYTELY) 420 g solution Take 4,000 mLs by mouth once. (Patient not taking: Reported on 11/25/2016)  . [DISCONTINUED] senna (SENOKOT) 8.6 MG tablet Take 1 tablet by mouth daily.   No facility-administered encounter medications on file as of 11/25/2016.     Allergies (verified) Asa [aspirin]  History: Past Medical History:  Diagnosis Date  . Abdominal pain   . Allergic rhinitis   . Anxiety   . Constipation   . Fibromyalgia   . GERD (gastroesophageal reflux disease)   . Head ache   . Hyperlipidemia   . Osteopenia   . PUD (peptic ulcer disease)   . Right hip pain   . Shingles    Past Surgical History:  Procedure Laterality Date  . COLONOSCOPY N/A 12/13/2015   Procedure: COLONOSCOPY;  Surgeon: Rogene Houston, MD;  Location: [facility];  Service:  Endoscopy;  Laterality: N/A;  1030  . TOTAL HIP ARTHROPLASTY Right    Family History  Problem Relation Age of Onset  . Breast cancer Mother   . Coronary artery disease Mother   . Emphysema Father   . COPD Father   . Hypertension Father   . Diabetes Sister   . Cancer Sister     throat  . Coronary artery disease Sister   . Diabetes Sister   . Esophageal cancer Sister   . Cancer Brother     brain  . Cancer Brother     bone  . Cancer Brother     lung  . Cancer Brother   . Lupus Brother    Social History   Occupational History  . Not on file.   Social History Main Topics  . Smoking status: Passive Smoke Exposure - Never Smoker  . Smokeless tobacco: Never Used  . Alcohol use No  . Drug use: No  . Sexual activity: Not on file    Do you feel safe at home?  Yes Are there smokers in your home (other than you)? Yes  Dietary issues and exercise activities: Current Exercise Habits: Home exercise routine, Type of exercise: calisthenics, Time (Minutes): 15, Frequency (Times/Week): 3, Weekly Exercise (Minutes/Week): 45, Intensity: Mild  Current Dietary habits:  Patient has made great changes to her diet in the last 4 months.  She has eliminated sugar sweetened drinks.  She is limiting bread, potatoes and other high CHO foods.   She snacks on vegetables.  Eating smaller meals 2-3 per day and snack 2-3 per day.    Objective:    Today's Vitals   11/25/16 1051  BP: 122/78  Pulse: 74  Weight: 217 lb 8 oz (98.7 kg)  Height: _0  (1.676 m)  PainSc: 2   PainLoc: Hip   Body mass index is 35.11 kg/m.  A1c was 6.2% today (down form 6.4% (08/26/2016)  Activities of Daily Living In your present state of health, do you have any difficulty performing the following activities: 11/25/2016  Hearing? N  Vision? N  Difficulty concentrating or making decisions? N  Walking or climbing stairs? N  Dressing or bathing? N  Doing errands, shopping? Y  Preparing Food and eating ? N    Using the Toilet? N  In the past six months, have you accidently leaked urine? N  Do you have problems with loss of bowel control? N  Managing your Medications? N  Managing your Finances? N  Housekeeping or managing your Housekeeping? N  Some recent data might be hidden     Cardiac Risk Factors include: advanced age (>55mn, >>77women);dyslipidemia;obesity (BMI >30kg/m2)  Depression Screen PHQ 2/9 Scores 11/25/2016 09/26/2016 08/20/2016 04/25/2016  PHQ - 2 Score 0 0 0 0     Fall Risk Fall Risk  11/25/2016 09/26/2016 08/20/2016 04/25/2016 03/18/2016  Falls in the past year? Yes Yes No No No  Number falls in past yr: 2 or more 1 - - -  Injury with Fall? No No - - -  Risk Factor Category  High Fall Risk - - - -  Risk for fall due to : History of fall(s) - - - -  Follow up Falls prevention discussed - - - -    Cognitive Function: MMSE - Mini Mental State Exam 11/25/2016  Orientation to time 5  Orientation to Place 5  Registration 2  Registration-comments HAD TO REPEAT FOR PATIENT  Attention/ Calculation 5  Recall 3  Language- name 2 objects 2  Language- repeat 1  Language- follow 3 step command 3  Language- read & follow direction 1  Write a sentence 1  Copy design 1  Total score 29    Immunizations and Health Maintenance Immunization History  Administered Date(s) Administered  . Pneumococcal Conjugate-13 11/09/2015  . Pneumococcal Polysaccharide-23 11/25/2016  . Td 11/02/2008   Health Maintenance Due  Topic Date Due  . URINE MICROALBUMIN  07/18/1960  . PNA vac Low Risk Adult (2 of 2 - PPSV23) 11/08/2016    Patient Care Team: [name], FNP as PCP - General (Family Medicine)  Indicate any recent Medical Services you may have received from other than [facility] providers in the past year (date may be approximate).    Assessment:    Annual Wellness Visit  Pre diabetes - BG improving Obesity - weight has decreased significantly, she has lost almost 24#.   Screening  Tests Health Maintenance  Topic Date Due  . URINE MICROALBUMIN  07/18/1960  . PNA vac Low Risk Adult (2 of 2 - PPSV23) 11/08/2016  . INFLUENZA VACCINE  02/26/2017  . MAMMOGRAM  11/19/2017  . TETANUS/TDAP  11/03/2018  . COLONOSCOPY  12/12/2025  . DEXA SCAN  Completed  . Hepatitis C Screening  Completed        Plan:   During the course of the visit Juliya was educated and counseled about the following appropriate screening and preventive services:   Vaccines to include Pneumoccal, Influenza, Td and Shingles - patient received Pneumovax 23 today  Colorectal cancer screening - UTD  Cardiovascular disease screening  Lipids showed elevated Tg 07/2016 but diet has improved.  Continue rosuvastatin  Diabetes screening - UTD  Bone Denisty / Osteoporosis Screening - UTD  Mammogram - appt made today for 02/2017  PAP - UTD  Glaucoma screening / Eye Exam - UTD  Nutrition counseling - continue with currently diet - great weight loss  Advanced Directives - information provided  Physical Activity - increase exercise - goal is 150 minutes per week  Will discuss possible alternatives to [facility] with patient's PCP.  Patient was supplied with application for LIS and for patient assistance program from [employer] who make [place]  Patient will try Zegrid OTC - take 1 tablet once a day in place of pantoprazole   Patient Instructions (the written plan) were given to the patient.   Cherre Robins, PharmD   11/25/2016

## 2016-11-25 NOTE — Patient Instructions (Addendum)
  Cynthia Leon , Thank you for taking time to come for your Medicare Wellness Visit. I appreciate your ongoing commitment to your health goals. Please review the following plan we discussed and let me know if I can assist you in the future.   These are the goals we discussed: Goals    . Weight (lb) < 210 lb (95.3 kg)       You are doing great with weight loss - you have lost 23 1/2 lbs so far and almost at your goal.    Increase exercise as you are able - goal is 150 minutes per week or 30 minutes a day.  Increase non-starchy vegetables - carrots, green bean, squash, zucchini, tomatoes, onions, peppers, spinach and other green leafy vegetables, cabbage, lettuce, cucumbers, asparagus, okra (not fried), eggplant Limit sugar and processed foods (cakes, cookies, ice cream, crackers and chips) Increase fresh fruit but limit serving sizes 1/2 cup or about the size of tennis or baseball Limit red meat to no more than 1-2 times per week (serving size about the size of your palm) Choose whole grains / lean proteins - whole wheat bread, quinoa, whole grain rice (1/2 cup), fish, chicken, [place] Avoid sugar and calorie containing beverages - soda, sweet tea and juice.  Choose water or unsweetened tea instead.  OK to try Zegrid over the counter - take 1 to 2 tablets once a day.  If you take Zegrid then stop pantoprazole.  This is a list of the screening recommended for you and due dates:  Health Maintenance  Topic Date Due      . Pneumonia vaccines (2 of 2 - PPSV23) 11/08/2016  . Flu Shot  02/26/2017  . Mammogram  11/19/2017  . Tetanus Vaccine  11/03/2018  . Colon Cancer Screening  12/12/2025  . DEXA scan (bone density measurement)  Completed  .  Hepatitis C: One time screening is recommended by Center for Disease Control  (CDC) for  adults born from [birth year] through 1966.   Completed

## 2016-12-06 ENCOUNTER — Other Ambulatory Visit: Payer: Self-pay | Admitting: Pharmacist

## 2016-12-06 MED ORDER — DULOXETINE HCL 20 MG PO CPEP: 20.0000 mg | ORAL_CAPSULE | Freq: Every day | ORAL | 1 refills | Status: DC

## 2016-12-06 NOTE — Telephone Encounter (Signed)
Cynthia Leon is no longer covered by patient's insurance.  She has fibromyalgia.  Gabapentin sent in but this has not been effective and patient and daughter are concerned about drowsiness with gabapentin  Discussed wth her PCP and will try duloxetine in stead.  20mg  qd.

## 2017-01-19 ENCOUNTER — Other Ambulatory Visit: Payer: Self-pay | Admitting: Family

## 2017-01-19 DIAGNOSIS — M25532 Pain in left wrist: Principal | ICD-10-CM

## 2017-01-19 DIAGNOSIS — M25531 Pain in right wrist: Secondary | ICD-10-CM

## 2017-01-29 ENCOUNTER — Other Ambulatory Visit: Payer: Self-pay | Admitting: Family

## 2017-01-30 NOTE — Telephone Encounter (Signed)
Last seen 1/18  [facility]

## 2017-02-17 ENCOUNTER — Ambulatory Visit (INDEPENDENT_AMBULATORY_CARE_PROVIDER_SITE_OTHER): Payer: Medicare Other | Admitting: Family

## 2017-02-17 ENCOUNTER — Encounter: Payer: Self-pay | Admitting: Family

## 2017-02-17 VITALS — BP 111/72 | HR 90 | Temp 97.5°F | Ht 66.0 in | Wt 210.4 lb

## 2017-02-17 DIAGNOSIS — F411 Generalized anxiety disorder: Secondary | ICD-10-CM

## 2017-02-17 DIAGNOSIS — M25531 Pain in right wrist: Secondary | ICD-10-CM | POA: Diagnosis not present

## 2017-02-17 DIAGNOSIS — E8881 Metabolic syndrome: Secondary | ICD-10-CM

## 2017-02-17 DIAGNOSIS — K219 Gastro-esophageal reflux disease without esophagitis: Secondary | ICD-10-CM

## 2017-02-17 DIAGNOSIS — K5900 Constipation, unspecified: Secondary | ICD-10-CM | POA: Diagnosis not present

## 2017-02-17 DIAGNOSIS — E669 Obesity, unspecified: Secondary | ICD-10-CM

## 2017-02-17 DIAGNOSIS — E559 Vitamin D deficiency, unspecified: Secondary | ICD-10-CM

## 2017-02-17 DIAGNOSIS — E785 Hyperlipidemia, unspecified: Secondary | ICD-10-CM

## 2017-02-17 DIAGNOSIS — M25532 Pain in left wrist: Secondary | ICD-10-CM | POA: Diagnosis not present

## 2017-02-17 DIAGNOSIS — G43009 Migraine without aura, not intractable, without status migrainosus: Secondary | ICD-10-CM

## 2017-02-17 DIAGNOSIS — R7303 Prediabetes: Secondary | ICD-10-CM | POA: Diagnosis not present

## 2017-02-17 LAB — BAYER DCA HB A1C WAIVED: HB A1C (BAYER DCA - WAIVED): 5.9 % (ref <7.0)

## 2017-02-17 MED ORDER — CYCLOBENZAPRINE HCL 10 MG PO TABS: ORAL_TABLET | ORAL | 5 refills | Status: DC

## 2017-02-17 MED ORDER — ALPRAZOLAM 0.5 MG PO TABS: 0.5000 mg | ORAL_TABLET | Freq: Every evening | ORAL | 5 refills | Status: DC | PRN

## 2017-02-17 NOTE — Progress Notes (Signed)
Subjective:    Patient ID: Cynthia Leon, female    DOB: 10-17-1950, 66 y.o.   MRN: [id number]  Pt presents to the office today for chronic follow up.  Gastroesophageal Reflux  She reports no belching, no coughing or no heartburn. This is a chronic problem. The current episode started more than 1 year ago. The problem occurs occasionally. The problem has been resolved. The symptoms are aggravated by certain foods. She has tried a PPI for the symptoms. The treatment provided moderate relief.  Hyperlipidemia  This is a chronic problem. The current episode started more than 1 year ago. The problem is controlled. Recent lipid tests were reviewed and are normal. Exacerbating diseases include obesity. Current antihyperlipidemic treatment includes statins. The current treatment provides moderate improvement of lipids. Risk factors for coronary artery disease include diabetes mellitus, dyslipidemia, female sex, post-menopausal and a sedentary lifestyle.  Diabetes  She presents for her follow-up diabetic visit. She has type 2 diabetes mellitus. Her disease course has been worsening. Hypoglycemia symptoms include nervousness/anxiousness. There are no diabetic associated symptoms. There are no hypoglycemic complications. Symptoms are stable. There are no diabetic complications. Pertinent negatives for diabetic complications include no heart disease or peripheral neuropathy. Risk factors for coronary artery disease include diabetes mellitus, dyslipidemia, obesity, post-menopausal, sedentary lifestyle and family history. She is following a diabetic diet. (Does not check BS at home )  Constipation  This is a chronic problem. The current episode started more than 1 year ago. The problem has been resolved since onset. She has tried stool softeners for the symptoms. The treatment provided moderate relief.  Anxiety  Presents for follow-up visit. Symptoms include excessive worry, nervous/anxious behavior and  restlessness. Patient reports no decreased concentration, irritability or panic. Symptoms occur occasionally.    Migraine   This is a chronic problem. The current episode started more than 1 year ago. The problem occurs monthly. The pain is mild. Pertinent negatives include no coughing. She has tried beta blockers for the symptoms. The treatment provided moderate relief. Her past medical history is significant for obesity.  Metabolic Syndrome  Pt taking Crestor daily and low carb diet. PT does not do any regular exercises.    Review of Systems  Constitutional: Negative for irritability.  Respiratory: Negative for cough.   Gastrointestinal: Positive for constipation. Negative for heartburn.  Psychiatric/Behavioral: Negative for decreased concentration. The patient is nervous/anxious.   All other systems reviewed and are negative.      Objective:   Physical Exam  Constitutional: She is oriented to person, place, and time. She appears well-developed and well-nourished. No distress.  HENT:  Head: Normocephalic and atraumatic.  Right Ear: External ear normal.  Left Ear: External ear normal.  Nose: Nose normal.  Mouth/Throat: Oropharynx is clear and moist.  Eyes: Pupils are equal, round, and reactive to light.  Neck: Normal range of motion. Neck supple. No thyromegaly present.  Cardiovascular: Normal rate, regular rhythm, normal heart sounds and intact distal pulses.   No murmur heard. Pulmonary/Chest: Effort normal and breath sounds normal. No respiratory distress. She has no wheezes.  Abdominal: Soft. Bowel sounds are normal. She exhibits no distension. There is no tenderness.  Musculoskeletal: Normal range of motion. She exhibits no edema or tenderness.  Neurological: She is alert and oriented to person, place, and time.  Skin: Skin is warm and dry.  Psychiatric: She has a normal mood and affect. Her behavior is normal. Judgment and thought content normal.  Vitals  reviewed.  BP 111/72   Pulse 90   Temp (!) 97.5 F (36.4 C) (Oral)   Ht '5\' 6"'  (1.676 m)   Wt 210 lb 6.4 oz (95.4 kg)   BMI 33.96 kg/m      Assessment & Plan:  1. Migraine without aura and without status migrainosus, not intractable - CMP14+EGFR  2. Gastroesophageal reflux disease, esophagitis presence not specified - CMP14+EGFR  3. Constipation, unspecified constipation type - CMP14+EGFR  4. Obesity (BMI 35.0-39.9 without comorbidity) - CMP14+EGFR  5. Hyperlipidemia, unspecified hyperlipidemia type - CMP14+EGFR - Lipid panel  6. GAD (generalized anxiety disorder) - CMP14+EGFR - ALPRAZolam (XANAX) 0.5 MG tablet; Take 1 tablet (0.5 mg total) by mouth at bedtime as needed.  Dispense: 30 tablet; Refill: 5  7. Metabolic syndrome - VSY54+CYOY - Bayer DCA Hb A1c Waived  8. Vitamin D deficiency - CMP14+EGFR - VITAMIN D 25 Hydroxy (Vit-D Deficiency, Fractures)  9. Pre-diabetes - Bayer DCA Hb A1c Waived - Microalbumin / creatinine urine ratio   Continue all meds Labs pending Health Maintenance reviewed Diet and exercise encouraged RTO 6 months   Evelina Dun, FNP

## 2017-02-17 NOTE — Patient Instructions (Signed)

## 2017-02-18 LAB — CMP14+EGFR
ALT: 14 IU/L (ref 0–32)
AST: 14 IU/L (ref 0–40)
Albumin/Globulin Ratio: 2 (ref 1.2–2.2)
Albumin: 4.4 g/dL (ref 3.6–4.8)
Alkaline Phosphatase: 80 IU/L (ref 39–117)
BUN/Creatinine Ratio: 8 — ABNORMAL LOW (ref 12–28)
BUN: 6 mg/dL — AB (ref 8–27)
Bilirubin Total: 0.3 mg/dL (ref 0.0–1.2)
CALCIUM: 9.6 mg/dL (ref 8.7–10.3)
CO2: 23 mmol/L (ref 20–29)
CREATININE: 0.79 mg/dL (ref 0.57–1.00)
Chloride: 103 mmol/L (ref 96–106)
GFR calc Af Amer: 90 mL/min/{1.73_m2} (ref >59)
GFR, EST NON AFRICAN AMERICAN: 78 mL/min/{1.73_m2} (ref >59)
GLUCOSE: 115 mg/dL — AB (ref 65–99)
Globulin, Total: 2.2 g/dL (ref 1.5–4.5)
Potassium: 4.4 mmol/L (ref 3.5–5.2)
Sodium: 142 mmol/L (ref 134–144)
Total Protein: 6.6 g/dL (ref 6.0–8.5)

## 2017-02-18 LAB — LIPID PANEL
CHOLESTEROL TOTAL: 136 mg/dL (ref 100–199)
Chol/HDL Ratio: 3.2 ratio (ref 0.0–4.4)
HDL: 42 mg/dL (ref >39)
LDL CALC: 66 mg/dL (ref 0–99)
Triglycerides: 139 mg/dL (ref 0–149)
VLDL CHOLESTEROL CAL: 28 mg/dL (ref 5–40)

## 2017-02-18 LAB — MICROALBUMIN / CREATININE URINE RATIO
Creatinine, Urine: 26.2 mg/dL
MICROALBUM., U, RANDOM: 5.4 ug/mL
Microalb/Creat Ratio: 20.6 mg/g creat (ref 0.0–30.0)

## 2017-02-18 LAB — VITAMIN D 25 HYDROXY (VIT D DEFICIENCY, FRACTURES): Vit D, 25-Hydroxy: 33.5 ng/mL (ref 30.0–100.0)

## 2017-03-05 ENCOUNTER — Other Ambulatory Visit: Payer: Self-pay | Admitting: Family

## 2017-03-20 ENCOUNTER — Other Ambulatory Visit: Payer: Self-pay | Admitting: Family

## 2017-03-20 DIAGNOSIS — E785 Hyperlipidemia, unspecified: Secondary | ICD-10-CM

## 2017-03-26 ENCOUNTER — Other Ambulatory Visit: Payer: Self-pay | Admitting: Family

## 2017-03-26 DIAGNOSIS — Z1231 Encounter for screening mammogram for malignant neoplasm of breast: Secondary | ICD-10-CM | POA: Diagnosis not present

## 2017-04-06 ENCOUNTER — Other Ambulatory Visit: Payer: Self-pay | Admitting: Family

## 2017-05-05 ENCOUNTER — Other Ambulatory Visit: Payer: Self-pay | Admitting: Family

## 2017-06-09 ENCOUNTER — Other Ambulatory Visit: Payer: Self-pay | Admitting: Family

## 2017-06-09 NOTE — Telephone Encounter (Signed)
Last seen 02/17/17 [facility]

## 2017-06-13 ENCOUNTER — Other Ambulatory Visit: Payer: Self-pay | Admitting: *Deleted

## 2017-06-13 DIAGNOSIS — E785 Hyperlipidemia, unspecified: Secondary | ICD-10-CM

## 2017-06-13 MED ORDER — ROSUVASTATIN CALCIUM 20 MG PO TABS: 20.0000 mg | ORAL_TABLET | Freq: Every day | ORAL | 0 refills | Status: DC

## 2017-06-16 ENCOUNTER — Encounter: Payer: Self-pay | Admitting: Physician Assistant

## 2017-06-16 ENCOUNTER — Ambulatory Visit (INDEPENDENT_AMBULATORY_CARE_PROVIDER_SITE_OTHER): Payer: Medicare Other | Admitting: Physician Assistant

## 2017-06-16 VITALS — BP 130/73 | HR 89 | Temp 97.6°F | Ht 66.0 in | Wt 211.0 lb

## 2017-06-16 DIAGNOSIS — D513 Other dietary vitamin B12 deficiency anemia: Secondary | ICD-10-CM

## 2017-06-16 DIAGNOSIS — K140 Glossitis: Secondary | ICD-10-CM | POA: Diagnosis not present

## 2017-06-16 MED ORDER — MAGIC MOUTHWASH W/LIDOCAINE: 5.0000 mL | Freq: Four times a day (QID) | ORAL | 0 refills | Status: DC

## 2017-06-16 NOTE — Progress Notes (Signed)
BP 130/73   Pulse 89   Temp 97.6 F (36.4 C) (Oral)   Ht 5\' 6"  (1.676 m)   Wt 211 lb (95.7 kg)   BMI 34.06 kg/m    Subjective:    Patient ID: Cynthia Leon, female    DOB: 1951-02-18, 66 y.o.   MRN: [id number]  HPI: Cynthia Leon is a 66 y.o. female presenting on 06/16/2017 for Sore Throat (pt here today c/o sore throat "it burns and mouth and lips burning like i ate hot peppers")  A few weeks of increasing mouth and tongue pain and burning. Feels like eating hot peppers. Does use Flonase, talked about holding for now. No fever, chills, throat swelling, no NVD.  Family with B12 deficiency. Her had not been low. Has reduced her foods significantly due to diabetes concern.  Relevant past medical, surgical, family and social history reviewed and updated as indicated. Allergies and medications reviewed and updated.  Past Medical History:  Diagnosis Date  . Abdominal pain   . Allergic rhinitis   . Anxiety   . Constipation   . Fibromyalgia   . GERD (gastroesophageal reflux disease)   . Head ache   . Hyperlipidemia   . Osteopenia   . PUD (peptic ulcer disease)   . Right hip pain   . Shingles     Past Surgical History:  Procedure Laterality Date  . COLONOSCOPY N/A 12/13/2015   Performed by Rogene Houston, MD at [place]  . TOTAL HIP ARTHROPLASTY Right     Review of Systems  Constitutional: Negative.   HENT: Negative.  Negative for mouth sores, sore throat, trouble swallowing and voice change.   Eyes: Negative.   Respiratory: Negative.   Gastrointestinal: Negative.   Genitourinary: Negative.   Neurological: Negative.  Negative for dizziness, tremors and weakness.    Allergies as of 06/16/2017      Reactions   Asa [aspirin] Nausea And Vomiting      Medication List        Accurate as of 06/16/17  6:25 PM. Always use your most recent med list.          ALPRAZolam 0.5 MG tablet Commonly known as:  XANAX Take 1 tablet (0.5 mg total) by  mouth at bedtime as needed.   cyclobenzaprine 10 MG tablet Commonly known as:  FLEXERIL TAKE 1 TABLET 3 TIMES A DAY AS NEEDED FOR MUSCLE SPASMS   docusate sodium 100 MG capsule Commonly known as:  COLACE Take 100 mg by mouth 2 (two) times daily.   DULoxetine 20 MG capsule Commonly known as:  CYMBALTA TAKE 1 CAPSULE (20 MG TOTAL) BY MOUTH DAILY. FOR FIBROMYALGIA AND MOOD.   fluticasone 50 MCG/ACT nasal spray Commonly known as:  FLONASE PLACE 2 SPRAYS INTO BOTH NOSTRILS DAILY.   gabapentin 300 MG capsule Commonly known as:  NEURONTIN TAKE 1 CAPSULE (300 MG TOTAL) BY MOUTH 3 (THREE) TIMES DAILY.   magic mouthwash w/lidocaine Soln Take 5 mLs 4 (four) times daily by mouth.   multivitamin with minerals tablet Take 1 tablet by mouth daily.   MYLANTA PO Take by mouth as needed.   naproxen 500 MG tablet Commonly known as:  NAPROSYN Take 1 tablet (500 mg total) by mouth 2 (two) times daily with a meal.   Omeprazole-Sodium Bicarbonate 20-1100 MG Caps capsule Commonly known as:  ZEGERID OTC Take 1 capsule by mouth daily before breakfast.   rosuvastatin 20 MG tablet Commonly known as:  CRESTOR Take  1 tablet (20 mg total) daily by mouth.   topiramate 50 MG tablet Commonly known as:  TOPAMAX TAKE ONE TABLET BY MOUTH TWICE DAILY   Vitamin D (Ergocalciferol) 50000 units Caps capsule Commonly known as:  DRISDOL Take 1 capsule (50,000 Units total) by mouth every 7 (seven) days.          Objective:    BP 130/73   Pulse 89   Temp 97.6 F (36.4 C) (Oral)   Ht 5\' 6"  (1.676 m)   Wt 211 lb (95.7 kg)   BMI 34.06 kg/m   Allergies  Allergen Reactions  . Asa [Aspirin] Nausea And Vomiting    Physical Exam  Constitutional: She is oriented to person, place, and time. She appears well-developed and well-nourished.  HENT:  Head: Normocephalic and atraumatic.  Mouth/Throat: She has dentures. No oral lesions. No dental abscesses or uvula swelling.  Mouth clear of ulcers. Tongue  with erythema and tenderness  Eyes: Conjunctivae and EOM are normal. Pupils are equal, round, and reactive to light.  Cardiovascular: Normal rate, regular rhythm, normal heart sounds and intact distal pulses.  Pulmonary/Chest: Effort normal and breath sounds normal.  Abdominal: Soft. Bowel sounds are normal.  Neurological: She is alert and oriented to person, place, and time. She has normal reflexes.  Skin: Skin is warm and dry. No rash noted.  Psychiatric: She has a normal mood and affect. Her behavior is normal. Judgment and thought content normal.  Nursing note and vitals reviewed.       Assessment & Plan:   1. Glossitis - CBC with Differential - Vitamin B12 - magic mouthwash w/lidocaine SOLN; Take 5 mLs 4 (four) times daily by mouth.  Dispense: 240 mL; Refill: 0  2. Other dietary vitamin B12 deficiency anemia - Vitamin B12    Current Outpatient Medications:  .  ALPRAZolam (XANAX) 0.5 MG tablet, Take 1 tablet (0.5 mg total) by mouth at bedtime as needed., Disp: 30 tablet, Rfl: 5 .  Calcium & Magnesium Carbonates (MYLANTA PO), Take by mouth as needed.  , Disp: , Rfl:  .  cyclobenzaprine (FLEXERIL) 10 MG tablet, TAKE 1 TABLET 3 TIMES A DAY AS NEEDED FOR MUSCLE SPASMS, Disp: 90 tablet, Rfl: 5 .  docusate sodium (COLACE) 100 MG capsule, Take 100 mg by mouth 2 (two) times daily., Disp: , Rfl:  .  DULoxetine (CYMBALTA) 20 MG capsule, TAKE 1 CAPSULE (20 MG TOTAL) BY MOUTH DAILY. FOR FIBROMYALGIA AND MOOD., Disp: 30 capsule, Rfl: 0 .  fluticasone (FLONASE) 50 MCG/ACT nasal spray, PLACE 2 SPRAYS INTO BOTH NOSTRILS DAILY., Disp: 16 g, Rfl: 5 .  gabapentin (NEURONTIN) 300 MG capsule, TAKE 1 CAPSULE (300 MG TOTAL) BY MOUTH 3 (THREE) TIMES DAILY., Disp: 90 capsule, Rfl: 3 .  magic mouthwash w/lidocaine SOLN, Take 5 mLs 4 (four) times daily by mouth., Disp: 240 mL, Rfl: 0 .  Multiple Vitamins-Minerals (MULTIVITAMIN WITH MINERALS) tablet, Take 1 tablet by mouth daily., Disp: , Rfl:  .   naproxen (NAPROSYN) 500 MG tablet, Take 1 tablet (500 mg total) by mouth 2 (two) times daily with a meal., Disp: 60 tablet, Rfl: 5 .  Omeprazole-Sodium Bicarbonate (ZEGERID OTC) 20-1100 MG CAPS capsule, Take 1 capsule by mouth daily before breakfast., Disp: 28 each, Rfl: 0 .  rosuvastatin (CRESTOR) 20 MG tablet, Take 1 tablet (20 mg total) daily by mouth., Disp: 90 tablet, Rfl: 0 .  topiramate (TOPAMAX) 50 MG tablet, TAKE ONE TABLET BY MOUTH TWICE DAILY, Disp: 180 tablet, Rfl: 2 .  Vitamin D, Ergocalciferol, (DRISDOL) 50000 units CAPS capsule, Take 1 capsule (50,000 Units total) by mouth every 7 (seven) days., Disp: 12 capsule, Rfl: 3 Continue all other maintenance medications as listed above.  Follow up plan: No Follow-up on file.  Educational handout given for [place] PA-C [place] [address]  [place],  [address] [phone]   06/16/2017, 6:25 PM

## 2017-06-16 NOTE — Patient Instructions (Signed)
Cyanocobalamin, Vitamin B12 injection What is this medicine? CYANOCOBALAMIN (sye an oh koe BAL a min) is a man made form of vitamin B12. Vitamin B12 is used in the growth of healthy blood cells, nerve cells, and proteins in the body. It also helps with the metabolism of fats and carbohydrates. This medicine is used to treat people who can not absorb vitamin B12. This medicine may be used for other purposes; ask your health care provider or pharmacist if you have questions. COMMON BRAND NAME(S): B-12 Compliance Kit, B-12 Injection Kit, Cyomin, LA-12, Nutri-Twelve, Physicians EZ Use B-12, Primabalt What should I tell my health care provider before I take this medicine? They need to know if you have any of these conditions: -kidney disease -Leber's disease -megaloblastic anemia -an unusual or allergic reaction to cyanocobalamin, cobalt, other medicines, foods, dyes, or preservatives -pregnant or trying to get pregnant -breast-feeding How should I use this medicine? This medicine is injected into a muscle or deeply under the skin. It is usually given by a health care professional in a clinic or doctor's office. However, your doctor may teach you how to inject yourself. Follow all instructions. Talk to your pediatrician regarding the use of this medicine in children. Special care may be needed. Overdosage: If you think you have taken too much of this medicine contact a poison control center or emergency room at once. NOTE: This medicine is only for you. Do not share this medicine with others. What if I miss a dose? If you are given your dose at a clinic or doctor's office, call to reschedule your appointment. If you give your own injections and you miss a dose, take it as soon as you can. If it is almost time for your next dose, take only that dose. Do not take double or extra doses. What may interact with this medicine? -colchicine -heavy alcohol intake This list may not describe all possible  interactions. Give your health care provider a list of all the medicines, herbs, non-prescription drugs, or dietary supplements you use. Also tell them if you smoke, drink alcohol, or use illegal drugs. Some items may interact with your medicine. What should I watch for while using this medicine? Visit your doctor or health care professional regularly. You may need blood work done while you are taking this medicine. You may need to follow a special diet. Talk to your doctor. Limit your alcohol intake and avoid smoking to get the best benefit. What side effects may I notice from receiving this medicine? Side effects that you should report to your doctor or health care professional as soon as possible: -allergic reactions like skin rash, itching or hives, swelling of the face, lips, or tongue -blue tint to skin -chest tightness, pain -difficulty breathing, wheezing -dizziness -red, swollen painful area on the leg Side effects that usually do not require medical attention (report to your doctor or health care professional if they continue or are bothersome): -diarrhea -headache This list may not describe all possible side effects. Call your doctor for medical advice about side effects. You may report side effects to FDA at 1-800-FDA-1088. Where should I keep my medicine? Keep out of the reach of children. Store at room temperature between 15 and 30 degrees C (59 and 85 degrees F). Protect from light. Throw away any unused medicine after the expiration date. NOTE: This sheet is a summary. It may not cover all possible information. If you have questions about this medicine, talk to your doctor, pharmacist, or   health care provider.  2018 Elsevier/Gold Standard (2007-10-26 22:10:20)  

## 2017-06-17 ENCOUNTER — Telehealth: Payer: Self-pay

## 2017-06-17 NOTE — Telephone Encounter (Signed)
Whatever they have on hand as either Magic or [facility]

## 2017-06-17 NOTE — Telephone Encounter (Signed)
Lisa at [facility] is calling about the [employer].  They need to know what ratios are needed for this.

## 2017-06-17 NOTE — Telephone Encounter (Signed)
Per pharmacist, they need to know how much lidocaine to use.  Per [place], 60 cc.  This info relayed to the pharmacist at [facility].

## 2017-06-18 LAB — CBC WITH DIFFERENTIAL/PLATELET
BASOS ABS: 0.1 10*3/uL (ref 0.0–0.2)
Basos: 1 %
EOS (ABSOLUTE): 0.4 10*3/uL (ref 0.0–0.4)
Eos: 5 %
HEMOGLOBIN: 13.3 g/dL (ref 11.1–15.9)
Hematocrit: 39.4 % (ref 34.0–46.6)
IMMATURE GRANS (ABS): 0.2 10*3/uL — AB (ref 0.0–0.1)
IMMATURE GRANULOCYTES: 2 %
LYMPHS: 34 %
Lymphocytes Absolute: 3 10*3/uL (ref 0.7–3.1)
MCH: 29.1 pg (ref 26.6–33.0)
MCHC: 33.8 g/dL (ref 31.5–35.7)
MCV: 86 fL (ref 79–97)
MONOCYTES: 7 %
Monocytes Absolute: 0.6 10*3/uL (ref 0.1–0.9)
NEUTROS PCT: 51 %
Neutrophils Absolute: 4.6 10*3/uL (ref 1.4–7.0)
PLATELETS: 260 10*3/uL (ref 150–379)
RBC: 4.57 x10E6/uL (ref 3.77–5.28)
RDW: 14.1 % (ref 12.3–15.4)
WBC: 8.8 10*3/uL (ref 3.4–10.8)

## 2017-06-18 LAB — VITAMIN B12: VITAMIN B 12: 584 pg/mL (ref 232–1245)

## 2017-06-20 ENCOUNTER — Telehealth: Payer: Self-pay | Admitting: Physician Assistant

## 2017-06-24 ENCOUNTER — Ambulatory Visit (INDEPENDENT_AMBULATORY_CARE_PROVIDER_SITE_OTHER): Payer: Medicare Other | Admitting: Family

## 2017-06-24 ENCOUNTER — Encounter: Payer: Self-pay | Admitting: Family

## 2017-06-24 VITALS — BP 117/78 | HR 85 | Temp 98.2°F | Ht 66.0 in | Wt 207.0 lb

## 2017-06-24 DIAGNOSIS — R590 Localized enlarged lymph nodes: Secondary | ICD-10-CM

## 2017-06-24 NOTE — Progress Notes (Signed)
   Subjective:    Patient ID: Cynthia Leon, female    DOB: 09-Jun-1951, 66 y.o.   MRN: [id number]  HPI Pt presents to the office today for a "knot" under her chin that she noticed 2-3 days ago that is unchanged. Pt denies any tenderness.  Pt was seen in the clinic on 06/16/17 and diagnosed with glossitis and given magic mouthwash. Pt states the tenderness in her mouth and throat is improving.   Pt does not smoke, but states her husband does. States her sister had throat cancer and always worries about that.   Review of Systems  All other systems reviewed and are negative.      Objective:   Physical Exam  Constitutional: She is oriented to person, place, and time. She appears well-developed and well-nourished. No distress.  HENT:  Head: Normocephalic and atraumatic.  Right Ear: External ear normal.  Left Ear: External ear normal.  Eyes: Pupils are equal, round, and reactive to light.  Neck: Normal range of motion. Neck supple. No thyromegaly present.  Cardiovascular: Normal rate, regular rhythm, normal heart sounds and intact distal pulses.  No murmur heard. Pulmonary/Chest: Effort normal and breath sounds normal. No respiratory distress. She has no wheezes.  Abdominal: Soft. Bowel sounds are normal. She exhibits no distension. There is no tenderness.  Musculoskeletal: Normal range of motion. She exhibits no edema or tenderness.  Neurological: She is alert and oriented to person, place, and time.  Skin: Skin is warm and dry.  Psychiatric: She has a normal mood and affect. Her behavior is normal. Judgment and thought content normal.  Vitals reviewed.    BP 117/78   Pulse 85   Temp 98.2 F (36.8 C) (Oral)   Ht 5\' 6"  (1.676 m)   Wt 207 lb (93.9 kg)   BMI 33.41 kg/m      Assessment & Plan:  1. Lymphadenopathy, submental Pt currently being treated for glossitis, continue with magic mouthwash Rest Warm compresses as needed Pt's husband is a smoker and pt smoked for  years in the past, if enlarged lymph node does not resolve in next two weeks we will need to investigate to rule out cancer RTO prn or if lymph does not improve    Evelina Dun, FNP

## 2017-06-24 NOTE — Telephone Encounter (Signed)
Patient was seen by provider on 06-24-17.

## 2017-06-24 NOTE — Patient Instructions (Signed)
Lymphadenopathy Lymphadenopathy refers to swollen or enlarged lymph glands, also called lymph nodes. Lymph glands are part of your body's defense (immune) system, which protects the body from infections, germs, and diseases. Lymph glands are found in many locations in your body, including the neck, underarm, and groin. Many things can cause lymph glands to become enlarged. When your immune system responds to germs, such as viruses or bacteria, infection-fighting cells and fluid build up. This causes the glands to grow in size. Usually, this is not something to worry about. The swelling and any soreness often go away without treatment. However, swollen lymph glands can also be caused by a number of diseases. Your health care provider may do various tests to help determine the cause. If the cause of your swollen lymph glands cannot be found, it is important to monitor your condition to make sure the swelling goes away. Follow these instructions at home: Watch your condition for any changes. The following actions may help to lessen any discomfort you are feeling:  Get plenty of rest.  Take medicines only as directed by your health care provider. Your health care provider may recommend over-the-counter medicines for pain.  Apply moist heat compresses to the site of swollen lymph nodes as directed by your health care provider. This can help reduce any pain.  Check your lymph nodes daily for any changes.  Keep all follow-up visits as directed by your health care provider. This is important.  Contact a health care provider if:  Your lymph nodes are still swollen after 2 weeks.  Your swelling increases or spreads to other areas.  Your lymph nodes are hard, seem fixed to the skin, or are growing rapidly.  Your skin over the lymph nodes is red and inflamed.  You have a fever.  You have chills.  You have fatigue.  You develop a sore throat.  You have abdominal pain.  You have weight  loss.  You have night sweats. Get help right away if:  You notice fluid leaking from the area of the enlarged lymph node.  You have severe pain in any area of your body.  You have chest pain.  You have shortness of breath. This information is not intended to replace advice given to you by your health care provider. Make sure you discuss any questions you have with your health care provider. Document Released: 04/23/2008 Document Revised: 12/21/2015 Document Reviewed: 02/17/2014 Elsevier Interactive Patient Education  2018 Elsevier Inc.  

## 2017-06-26 NOTE — Telephone Encounter (Signed)
Attempted to contact patient- no call back. This encounter will be closed. 

## 2017-07-16 ENCOUNTER — Other Ambulatory Visit: Payer: Self-pay | Admitting: Family

## 2017-07-18 ENCOUNTER — Other Ambulatory Visit: Payer: Self-pay | Admitting: Family

## 2017-08-08 ENCOUNTER — Other Ambulatory Visit: Payer: Self-pay | Admitting: Family

## 2017-08-11 ENCOUNTER — Other Ambulatory Visit: Payer: Self-pay | Admitting: Family

## 2017-08-18 ENCOUNTER — Other Ambulatory Visit: Payer: Self-pay | Admitting: Family

## 2017-09-01 ENCOUNTER — Other Ambulatory Visit: Payer: Self-pay | Admitting: Family

## 2017-09-01 DIAGNOSIS — F411 Generalized anxiety disorder: Secondary | ICD-10-CM

## 2017-09-01 NOTE — Telephone Encounter (Signed)
Aware, script for xanax was sent to pharmacy.

## 2017-09-03 ENCOUNTER — Other Ambulatory Visit: Payer: Self-pay | Admitting: Family

## 2017-09-03 DIAGNOSIS — M25532 Pain in left wrist: Principal | ICD-10-CM

## 2017-09-03 DIAGNOSIS — M25531 Pain in right wrist: Secondary | ICD-10-CM

## 2017-09-05 ENCOUNTER — Other Ambulatory Visit: Payer: Self-pay | Admitting: Family

## 2017-09-12 ENCOUNTER — Other Ambulatory Visit: Payer: Self-pay | Admitting: Family

## 2017-09-12 DIAGNOSIS — E785 Hyperlipidemia, unspecified: Secondary | ICD-10-CM

## 2017-09-12 NOTE — Telephone Encounter (Signed)
Last seen 02/17/17  [facility]

## 2017-09-26 ENCOUNTER — Ambulatory Visit (INDEPENDENT_AMBULATORY_CARE_PROVIDER_SITE_OTHER): Payer: Medicare HMO | Admitting: Family Medicine

## 2017-09-26 ENCOUNTER — Encounter: Payer: Self-pay | Admitting: Family Medicine

## 2017-09-26 VITALS — BP 128/73 | HR 90 | Temp 98.3°F | Ht 66.0 in | Wt 211.0 lb

## 2017-09-26 DIAGNOSIS — M25561 Pain in right knee: Secondary | ICD-10-CM | POA: Diagnosis not present

## 2017-09-26 MED ORDER — MELOXICAM 7.5 MG PO TABS: 7.5000 mg | ORAL_TABLET | Freq: Every day | ORAL | 0 refills | Status: DC

## 2017-09-26 NOTE — Patient Instructions (Signed)
You have prescribed a nonsteroidal anti-inflammatory drug (NSAID) today. This will help with ear pain and inflammation. Please do not take any other NSAIDs (ibuprofen/Motrin/Advil, naproxen/Aleve, meloxicam/Mobic, Voltaren/diclofenac). Please make sure to eat a meal when taking this medication.   Caution:  If you have a history of acid reflux/indigestion, I recommend that you take an antacid (such as Prilosec, Prevacid) daily while on the NSAID.  If you have a history of bleeding disorder, gastric ulcer, are on a blood thinner (like warfarin/Coumadin, Xarelto, Eliquis, etc) please do not take NSAID.  If you have ever had a heart attack, you should not take NSAIDs.   Arthritis Arthritis means joint pain. It can also mean joint disease. A joint is a place where bones come together. People who have arthritis may have:  Red joints.  Swollen joints.  Stiff joints.  Warm joints.  A fever.  A feeling of being sick.  Follow these instructions at home: Pay attention to any changes in your symptoms. Take these actions to help with your pain and swelling. Medicines  Take over-the-counter and prescription medicines only as told by your doctor.  Do not take aspirin for pain if your doctor says that you may have gout. Activity  Rest your joint if your doctor tells you to.  Avoid activities that make the pain worse.  Exercise your joint regularly as told by your doctor. Try doing exercises like: ? Swimming. ? Water aerobics. ? Biking. ? Walking. Joint Care   If your joint is swollen, keep it raised (elevated) if told by your doctor.  If your joint feels stiff in the morning, try taking a warm shower.  If you have diabetes, do not apply heat without asking your doctor.  If told, apply heat to the joint: ? Put a towel between the joint and the hot pack or heating pad. ? Leave the heat on the area for 20-30 minutes.  If told, apply ice to the joint: ? Put ice in a plastic  bag. ? Place a towel between your skin and the bag. ? Leave the ice on for 20 minutes, 2-3 times per day.  Keep all follow-up visits as told by your doctor. Contact a doctor if:  The pain gets worse.  You have a fever. Get help right away if:  You have very bad pain in your joint.  You have swelling in your joint.  Your joint is red.  Many joints become painful and swollen.  You have very bad back pain.  Your leg is very weak.  You cannot control your pee (urine) or poop (stool). This information is not intended to replace advice given to you by your health care provider. Make sure you discuss any questions you have with your health care provider. Document Released: 10/09/2009 Document Revised: 12/21/2015 Document Reviewed: 10/10/2014 Elsevier Interactive Patient Education  [employer].

## 2017-09-26 NOTE — Progress Notes (Signed)
Subjective: CC: knee pain PCP: [name], FNP IZT:[id number] Jerilynn Mages Palen is a 67 y.o. female presenting to clinic today for:  1. Knee pain Patient reports a 2-day history of worsening right knee pain with associated swelling.  She notes that pain seemed to get worse with weather change.  Pain is worse after she has been seated for a long time and goes to get up. No preceding injury.  She notes that bilateral shoulders and arms also hurt.  She has a history of fibromyalgia.  She denies joint pain in the shoulders and arms and actually notes it seems to be more muscular in nature.  Denies change in exercise habits, dark urine, decreased p.o. intake or immobility.  She has had no fevers, nausea, vomiting or diarrhea.  She is currently fighting an upper respiratory infection.  She has been taking Naprosyn p.o. twice daily and Flexeril as prescribed for her fibromyalgia with little improvement in symptoms.  She notes a family history significant for lupus in her daughter and brother.   ROS: Per HPI  Allergies  Allergen Reactions  . Asa [Aspirin] Nausea And Vomiting   Past Medical History:  Diagnosis Date  . Abdominal pain   . Allergic rhinitis   . Anxiety   . Constipation   . Fibromyalgia   . GERD (gastroesophageal reflux disease)   . Head ache   . Hyperlipidemia   . Osteopenia   . PUD (peptic ulcer disease)   . Right hip pain   . Shingles     Current Outpatient Medications:  .  ALPRAZolam (XANAX) 0.5 MG tablet, TAKE 1 TABLET BY MOUTH AT BEDTIME AS NEEDED, Disp: 30 tablet, Rfl: 0 .  Calcium & Magnesium Carbonates (MYLANTA PO), Take by mouth as needed.  , Disp: , Rfl:  .  cyclobenzaprine (FLEXERIL) 10 MG tablet, TAKE 1 TABLET 3 TIMES A DAY AS NEEDED FOR MUSCLE SPASMS, Disp: 90 tablet, Rfl: 5 .  docusate sodium (COLACE) 100 MG capsule, Take 100 mg by mouth 2 (two) times daily., Disp: , Rfl:  .  DULoxetine (CYMBALTA) 20 MG capsule, TAKE 1 CAPSULE (20 MG TOTAL) BY MOUTH DAILY. FOR  FIBROMYALGIA AND MOOD., Disp: 30 capsule, Rfl: 1 .  fluticasone (FLONASE) 50 MCG/ACT nasal spray, PLACE 2 SPRAYS INTO BOTH NOSTRILS DAILY., Disp: 16 g, Rfl: 5 .  gabapentin (NEURONTIN) 300 MG capsule, TAKE 1 CAPSULE (300 MG TOTAL) BY MOUTH 3 (THREE) TIMES DAILY., Disp: 270 capsule, Rfl: 0 .  magic mouthwash w/lidocaine SOLN, Take 5 mLs 4 (four) times daily by mouth., Disp: 240 mL, Rfl: 0 .  Multiple Vitamins-Minerals (MULTIVITAMIN WITH MINERALS) tablet, Take 1 tablet by mouth daily., Disp: , Rfl:  .  naproxen (NAPROSYN) 500 MG tablet, Take 1 tablet (500 mg total) by mouth 2 (two) times daily with a meal., Disp: 60 tablet, Rfl: 5 .  Omeprazole-Sodium Bicarbonate (ZEGERID OTC) 20-1100 MG CAPS capsule, Take 1 capsule by mouth daily before breakfast., Disp: 28 each, Rfl: 0 .  rosuvastatin (CRESTOR) 20 MG tablet, TAKE 1 TABLET (20 MG TOTAL) DAILY BY MOUTH., Disp: 90 tablet, Rfl: 0 .  topiramate (TOPAMAX) 50 MG tablet, TAKE ONE TABLET BY MOUTH TWICE DAILY, Disp: 180 tablet, Rfl: 0 .  Vitamin D, Ergocalciferol, (DRISDOL) 50000 units CAPS capsule, TAKE 1 CAPSULE (50,000 UNITS TOTAL) BY MOUTH EVERY 7 (SEVEN) DAYS., Disp: 12 capsule, Rfl: 0 Social History   Socioeconomic History  . Marital status: Married    Spouse name: Not on file  .  Number of children: Not on file  . Years of education: Not on file  . Highest education level: Not on file  Social Needs  . Financial resource strain: Not on file  . Food insecurity - worry: Not on file  . Food insecurity - inability: Not on file  . Transportation needs - medical: Not on file  . Transportation needs - non-medical: Not on file  Occupational History  . Not on file  Tobacco Use  . Smoking status: Passive Smoke Exposure - Never Smoker  . Smokeless tobacco: Never Used  Substance and Sexual Activity  . Alcohol use: No  . Drug use: No  . Sexual activity: Not on file  Other Topics Concern  . Not on file  Social History Narrative  . Not on file    Family History  Problem Relation Age of Onset  . Breast cancer Mother   . Coronary artery disease Mother   . Emphysema Father   . COPD Father   . Hypertension Father   . Diabetes Sister   . Cancer Sister        throat  . Coronary artery disease Sister   . Diabetes Sister   . Esophageal cancer Sister   . Cancer Brother        brain  . Cancer Brother        bone  . Cancer Brother        lung  . Cancer Brother   . Lupus Brother     Objective: Office vital signs reviewed. BP 128/73   Pulse 90   Temp 98.3 F (36.8 C) (Oral)   Ht 5\' 6"  (1.676 m)   Wt 211 lb (95.7 kg)   BMI 34.06 kg/m   Physical Examination:  General: Awake, alert, obese, No acute distress MSK: Patient has full active range of motion of bilateral upper and lower extremities.  She ambulates independently.  Right knee: No gross swelling or effusion noted.  No erythema, ecchymosis.  No palpable crepitus.  No tenderness to palpation to the patella, patellar tendon, quad tendon, joint line or posterior popliteal fossa.  No palpable bony abnormalities.  No ligamentous laxity.  Left knee: No gross swelling or effusion noted.  No erythema, ecchymosis.  No palpable crepitus.  No tenderness to palpation to the patella, patellar tendon, quad tendon, joint line or posterior popliteal fossa.  No palpable bony abnormalities.  No ligamentous laxity. Neuro: Light touch sensation grossly intact.  No focal neurologic deficits.  Assessment/ Plan: 67 y.o. female   1. Arthralgia of right knee I suspect this is an osteoarthritis of the right knee.  She does note pain in other parts of her body but these are more muscular in nature.  I suspect those pains may be related to her fibromyalgia.  I did discuss and offer steroid injection to the knee.  Patient declined this today.  She wishes to proceed with oral NSAID that is different from the one that she is on.  We have discontinue naproxen.  Start meloxicam 7.5 mg daily.  Use  discussed with the patient.  Avoid other NSAIDs.  Handout provided.  She will follow-up with her PCP in 1 week for reevaluation.  If persistent, would consider adding autoimmune workup given her family history of lupus.   Meds ordered this encounter  Medications  . meloxicam (MOBIC) 7.5 MG tablet    Sig: Take 1 tablet (7.5 mg total) by mouth daily. If needed for joint pain.    Dispense:  30 tablet    Refill:  [place], DO [place] [phone]

## 2017-10-01 ENCOUNTER — Other Ambulatory Visit: Payer: Self-pay | Admitting: Family Medicine

## 2017-10-01 DIAGNOSIS — F411 Generalized anxiety disorder: Secondary | ICD-10-CM

## 2017-10-01 NOTE — Telephone Encounter (Signed)
Last seen 09/26/17

## 2017-10-03 ENCOUNTER — Other Ambulatory Visit: Payer: Self-pay | Admitting: Family

## 2017-10-03 ENCOUNTER — Encounter: Payer: Self-pay | Admitting: Family

## 2017-10-03 ENCOUNTER — Ambulatory Visit (INDEPENDENT_AMBULATORY_CARE_PROVIDER_SITE_OTHER): Payer: Medicare HMO | Admitting: Family

## 2017-10-03 VITALS — BP 119/74 | HR 91 | Temp 98.1°F | Ht 66.0 in | Wt 210.0 lb

## 2017-10-03 DIAGNOSIS — E785 Hyperlipidemia, unspecified: Secondary | ICD-10-CM

## 2017-10-03 DIAGNOSIS — M150 Primary generalized (osteo)arthritis: Secondary | ICD-10-CM

## 2017-10-03 DIAGNOSIS — M1990 Unspecified osteoarthritis, unspecified site: Secondary | ICD-10-CM | POA: Insufficient documentation

## 2017-10-03 DIAGNOSIS — M797 Fibromyalgia: Secondary | ICD-10-CM | POA: Diagnosis not present

## 2017-10-03 DIAGNOSIS — M159 Polyosteoarthritis, unspecified: Secondary | ICD-10-CM

## 2017-10-03 DIAGNOSIS — J301 Allergic rhinitis due to pollen: Secondary | ICD-10-CM

## 2017-10-03 MED ORDER — BACLOFEN 10 MG PO TABS: 10.0000 mg | ORAL_TABLET | Freq: Three times a day (TID) | ORAL | 2 refills | Status: DC

## 2017-10-03 MED ORDER — FLUTICASONE PROPIONATE 50 MCG/ACT NA SUSP: 2.0000 | Freq: Every day | NASAL | 5 refills | Status: DC

## 2017-10-03 NOTE — Patient Instructions (Signed)

## 2017-10-03 NOTE — Progress Notes (Signed)
   Subjective:    Patient ID: Cynthia Leon, female    DOB: March 03, 1951, 67 y.o.   MRN: [id number]  PT presents to the office today to recheck osteoarthritis. Pt was seen on 09/26/17 and started on Mobic 7.5 mg. PT reports that this is causing her dizziness.   PT states she also needs another RX for a muscle relaxer because her insurance will no longer pay for Flexeril.  Arthritis  Presents for follow-up visit. She complains of pain, stiffness and joint swelling. Affected locations include the right MCP, left MCP, right knee and left knee (back). Her pain is at a severity of 7/10.      Review of Systems  Musculoskeletal: Positive for arthralgias, arthritis, back pain, joint swelling and stiffness.  All other systems reviewed and are negative.      Objective:   Physical Exam  Constitutional: She is oriented to person, place, and time. She appears well-developed and well-nourished. No distress.  HENT:  Head: Normocephalic and atraumatic.  Right Ear: External ear normal.  Left Ear: External ear normal.  Mouth/Throat: Oropharynx is clear and moist.  Eyes: Pupils are equal, round, and reactive to light.  Neck: Normal range of motion. Neck supple. No thyromegaly present.  Cardiovascular: Normal rate, regular rhythm, normal heart sounds and intact distal pulses.  No murmur heard. Pulmonary/Chest: Effort normal and breath sounds normal. No respiratory distress. She has no wheezes.  Abdominal: Soft. Bowel sounds are normal. She exhibits no distension. There is no tenderness.  Musculoskeletal: Normal range of motion. She exhibits no edema or tenderness.  Neurological: She is alert and oriented to person, place, and time.  Skin: Skin is warm and dry.  Psychiatric: She has a normal mood and affect. Her behavior is normal. Judgment and thought content normal.  Vitals reviewed.     BP 119/74   Pulse 91   Temp 98.1 F (36.7 C) (Oral)   Ht '5\' 6"'$  (1.676 m)   Wt 210 lb (95.3 kg)    BMI 33.89 kg/m      Assessment & Plan:  1. Allergic rhinitis due to pollen, unspecified seasonality Avoid allergens  - fluticasone (FLONASE) 50 MCG/ACT nasal spray; Place 2 sprays into both nostrils daily.  Dispense: 16 g; Refill: 5 - BMP8+EGFR  2. Fibromyalgia - baclofen (LIORESAL) 10 MG tablet; Take 1 tablet (10 mg total) by mouth 3 (three) times daily.  Dispense: 60 each; Refill: 2 - BMP8+EGFR  3. Primary osteoarthritis involving multiple joints Rest Ice ROM exercises discussed - BMP8+EGFR   Evelina Dun, FNP

## 2017-10-04 LAB — BMP8+EGFR
BUN/Creatinine Ratio: 12 (ref 12–28)
BUN: 9 mg/dL (ref 8–27)
CO2: 24 mmol/L (ref 20–29)
Calcium: 9.7 mg/dL (ref 8.7–10.3)
Chloride: 102 mmol/L (ref 96–106)
Creatinine, Ser: 0.73 mg/dL (ref 0.57–1.00)
GFR, EST AFRICAN AMERICAN: 99 mL/min/{1.73_m2} (ref >59)
GFR, EST NON AFRICAN AMERICAN: 85 mL/min/{1.73_m2} (ref >59)
Glucose: 96 mg/dL (ref 65–99)
POTASSIUM: 4.4 mmol/L (ref 3.5–5.2)
SODIUM: 140 mmol/L (ref 134–144)

## 2017-10-15 ENCOUNTER — Other Ambulatory Visit: Payer: Self-pay | Admitting: Family

## 2017-10-15 NOTE — Telephone Encounter (Signed)
Last Vit D  02/17/17  33.5

## 2017-10-23 ENCOUNTER — Other Ambulatory Visit: Payer: Self-pay | Admitting: Family Medicine

## 2017-10-29 DIAGNOSIS — K08109 Complete loss of teeth, unspecified cause, unspecified class: Secondary | ICD-10-CM | POA: Diagnosis not present

## 2017-10-29 DIAGNOSIS — E785 Hyperlipidemia, unspecified: Secondary | ICD-10-CM | POA: Diagnosis not present

## 2017-10-29 DIAGNOSIS — E669 Obesity, unspecified: Secondary | ICD-10-CM | POA: Diagnosis not present

## 2017-10-29 DIAGNOSIS — K219 Gastro-esophageal reflux disease without esophagitis: Secondary | ICD-10-CM | POA: Diagnosis not present

## 2017-10-29 DIAGNOSIS — J310 Chronic rhinitis: Secondary | ICD-10-CM | POA: Diagnosis not present

## 2017-10-29 DIAGNOSIS — G43909 Migraine, unspecified, not intractable, without status migrainosus: Secondary | ICD-10-CM | POA: Diagnosis not present

## 2017-10-29 DIAGNOSIS — R69 Illness, unspecified: Secondary | ICD-10-CM | POA: Diagnosis not present

## 2017-10-29 DIAGNOSIS — K5900 Constipation, unspecified: Secondary | ICD-10-CM | POA: Diagnosis not present

## 2017-10-29 DIAGNOSIS — F419 Anxiety disorder, unspecified: Secondary | ICD-10-CM | POA: Diagnosis not present

## 2017-10-29 DIAGNOSIS — G8929 Other chronic pain: Secondary | ICD-10-CM | POA: Diagnosis not present

## 2017-10-30 ENCOUNTER — Other Ambulatory Visit: Payer: Self-pay | Admitting: Family

## 2017-10-30 DIAGNOSIS — F411 Generalized anxiety disorder: Secondary | ICD-10-CM

## 2017-11-03 ENCOUNTER — Other Ambulatory Visit: Payer: Self-pay | Admitting: Family

## 2017-11-06 ENCOUNTER — Other Ambulatory Visit: Payer: Self-pay

## 2017-11-06 MED ORDER — DULOXETINE HCL 20 MG PO CPEP: 20.0000 mg | ORAL_CAPSULE | Freq: Every day | ORAL | 0 refills | Status: DC

## 2017-11-26 ENCOUNTER — Ambulatory Visit (INDEPENDENT_AMBULATORY_CARE_PROVIDER_SITE_OTHER): Payer: Medicare HMO | Admitting: *Deleted

## 2017-11-26 ENCOUNTER — Encounter: Payer: Self-pay | Admitting: *Deleted

## 2017-11-26 VITALS — BP 117/68 | HR 78 | Ht 66.0 in | Wt 209.0 lb

## 2017-11-26 DIAGNOSIS — Z0000 Encounter for general adult medical examination without abnormal findings: Secondary | ICD-10-CM | POA: Diagnosis not present

## 2017-11-26 NOTE — Patient Instructions (Signed)
  Ms. Ottaway , Thank you for taking time to come for your Medicare Wellness Visit. I appreciate your ongoing commitment to your health goals. Please review the following plan we discussed and let me know if I can assist you in the future.   These are the goals we discussed: Goals    . Exercise 150 min/wk Moderate Activity     Consider joining [employer]    . Weight (lb) < 210 lb (95.3 kg)       This is a list of the screening recommended for you and due dates:  Health Maintenance  Topic Date Due  . Urine Protein Check  02/17/2018  . Flu Shot  02/26/2018  . Tetanus Vaccine  11/03/2018  . Mammogram  03/27/2019  . Colon Cancer Screening  12/12/2025  . DEXA scan (bone density measurement)  Completed  .  Hepatitis C: One time screening is recommended by Center for Disease Control  (CDC) for  adults born from [birth year] through 1966.   Completed  . Pneumonia vaccines  Completed

## 2017-11-26 NOTE — Progress Notes (Addendum)
Subjective:   Cynthia Leon is a 67 y.o. female who presents for an Initial Medicare Annual Wellness Visit. Cynthia Leon is retired and lives at home with her husband of 8 years. She has 3 children and helped to raise her husbands 2 daughters one of which died at 69 with melanoma. They have 11 grandkids and 9 great grandkids.  Review of Systems    Health is about the same as last year.   Cardiac Risk Factors include: advanced age (>10men, >8 women);family history of premature cardiovascular disease;sedentary lifestyle;obesity (BMI >30kg/m2);hypertension;dyslipidemia     Objective:     BP 117/68 (BP Location: Left Arm, Patient Position: Sitting, Cuff Size: Large)   Pulse 78   Ht 5\' 6"  (1.676 m)   Wt 209 lb (94.8 kg)   BMI 33.73 kg/m    Advanced Directives 11/26/2017 11/25/2016 12/13/2015  Does Patient Have a Medical Advance Directive? No No No  Would patient like information on creating a medical advance directive? No - Patient declined Yes (MAU/Ambulatory/Procedural Areas - Information given) -    Current Medications (verified) Outpatient Encounter Medications as of 11/26/2017  Medication Sig  . Calcium & Magnesium Carbonates (MYLANTA PO) Take by mouth as needed.    . DULoxetine (CYMBALTA) 20 MG capsule Take 1 capsule (20 mg total) by mouth daily. For fibromyalgia and mood.  . fluticasone (FLONASE) 50 MCG/ACT nasal spray Place 2 sprays into both nostrils daily.  . Multiple Vitamins-Minerals (MULTIVITAMIN WITH MINERALS) tablet Take 1 tablet by mouth daily.  Earney Navy Bicarbonate (ZEGERID OTC) 20-1100 MG CAPS capsule Take 1 capsule by mouth daily before breakfast.  . rosuvastatin (CRESTOR) 20 MG tablet TAKE 1 TABLET (20 MG TOTAL) DAILY BY MOUTH.  . topiramate (TOPAMAX) 50 MG tablet TAKE ONE TABLET BY MOUTH TWICE DAILY  . Vitamin D, Ergocalciferol, (DRISDOL) 50000 units CAPS capsule TAKE 1 CAPSULE (50,000 UNITS TOTAL) BY MOUTH EVERY 7 (SEVEN) DAYS.  . [DISCONTINUED]  ALPRAZolam (XANAX) 0.5 MG tablet TAKE 1 TABLET BY MOUTH EVERY DAY AT BEDTIME AS NEEDED  . [DISCONTINUED] baclofen (LIORESAL) 10 MG tablet Take 1 tablet (10 mg total) by mouth 3 (three) times daily.  . [DISCONTINUED] meloxicam (MOBIC) 7.5 MG tablet TAKE 1 TABLET (7.5 MG TOTAL) BY MOUTH DAILY. IF NEEDED FOR JOINT PAIN.   No facility-administered encounter medications on file as of 11/26/2017.     Allergies (verified) Mobic [meloxicam] and Asa [aspirin]   History: Past Medical History:  Diagnosis Date  . Abdominal pain   . Allergic rhinitis   . Anxiety   . Constipation   . Fibromyalgia   . GERD (gastroesophageal reflux disease)   . Head ache   . Hyperlipidemia   . Osteopenia   . PUD (peptic ulcer disease)   . Right hip pain   . Shingles    Past Surgical History:  Procedure Laterality Date  . COLONOSCOPY N/A 12/13/2015   Procedure: COLONOSCOPY;  Surgeon: Rogene Houston, MD;  Location: [facility];  Service: Endoscopy;  Laterality: N/A;  1030  . TOTAL HIP ARTHROPLASTY Right    Family History  Problem Relation Age of Onset  . Breast cancer Mother   . Coronary artery disease Mother   . Cancer Mother        breast  . Emphysema Father   . COPD Father   . Hypertension Father   . Heart disease Father   . Diabetes Sister   . Cancer Sister  throat  . Coronary artery disease Sister   . Diabetes Sister   . Esophageal cancer Sister   . Cancer Brother        brain  . Cancer Brother        bone  . Cancer Brother        lung  . Cancer Brother   . Lupus Brother   . Healthy Daughter   . Healthy Daughter   . Healthy Daughter    Social History   Socioeconomic History  . Marital status: Married    Spouse name: Not on file  . Number of children: 3  . Years of education: 10  . Highest education level: 11th grade  Occupational History    Comment: Retired  Scientific laboratory technician  . Financial resource strain: Not hard at all  . Food insecurity:    Worry: Never true     Inability: Never true  . Transportation needs:    Medical: No    Non-medical: No  Tobacco Use  . Smoking status: Passive Smoke Exposure - Never Smoker  . Smokeless tobacco: Never Used  Substance and Sexual Activity  . Alcohol use: No  . Drug use: No  . Sexual activity: Yes  Lifestyle  . Physical activity:    Days per week: 0 days    Minutes per session: 0 min  . Stress: Not at all  Relationships  . Social connections:    Talks on phone: More than three times a week    Gets together: More than three times a week    Attends religious service: Never    Active member of club or organization: No    Attends meetings of clubs or organizations: Never    Relationship status: Married  Other Topics Concern  . Not on file  Social History Narrative  . Not on file    Tobacco Counseling No tobacco use  Clinical Intake:    Pain : No/denies pain    Nutritional Status: BMI > 30  Obese Diabetes: No  How often do you need to have someone help you when you read instructions, pamphlets, or other written materials from your doctor or pharmacy?: 1 - Never What is the last grade level you completed in school?: 11  Interpreter Needed?: No  Information entered by :: Chong Sicilian, RN   Activities of Daily Living In your present state of health, do you have any difficulty performing the following activities: 11/26/2017  Hearing? N  Vision? N  Comment has yearly eye exams  Difficulty concentrating or making decisions? N  Walking or climbing stairs? N  Dressing or bathing? N  Doing errands, shopping? N  Preparing Food and eating ? N  Using the Toilet? N  In the past six months, have you accidently leaked urine? Y  Comment Some stress incontinence  Do you have problems with loss of bowel control? N  Managing your Medications? N  Managing your Finances? N  Housekeeping or managing your Housekeeping? N  Some recent data might be hidden     Immunizations and Health  Maintenance Immunization History  Administered Date(s) Administered  . Pneumococcal Conjugate-13 11/09/2015  . Pneumococcal Polysaccharide-23 11/25/2016  . Td 11/02/2008   There are no preventive care reminders to display for this patient.  Patient Care Team: [name], FNP as PCP - General (Family Medicine)   No hospitalizations, ER visits, or surgeries this past year.      Assessment:   This is a routine wellness  examination for [place].  Hearing/Vision screen No deficits noted during visit.  Dietary issues and exercise activities discussed:  Diet Has cutout all sugar, eats whole wheat bread, no fried foods, 1 tbsp honey  Current Exercise Habits: The patient does not participate in regular exercise at present, Exercise limited by: orthopedic condition(s)  Goals    . Exercise 150 min/wk Moderate Activity     Consider joining [employer]    . Weight (lb) < 210 lb (95.3 kg)      Depression Screen PHQ 2/9 Scores 12/03/2017 11/28/2017 11/26/2017 10/03/2017 09/26/2017 06/24/2017 06/16/2017  PHQ - 2 Score 0 0 0 0 0 0 0    Fall Risk Fall Risk  12/03/2017 11/26/2017 10/03/2017 09/26/2017 06/24/2017  Falls in the past year? No No No No No  Comment - - - - -  Number falls in past yr: - - - - -  Injury with Fall? - - - - -  Risk Factor Category  - - - - -  Risk for fall due to : - - - - -  Follow up - - - - -    Is the patient's home free of loose throw rugs in walkways, pet beds, electrical cords, etc?   yes      Grab bars in the bathroom? no      Handrails on the stairs?   yes      Adequate lighting?   yes   Cognitive Function:    MMSE - Mini Mental State Exam 11/26/2017 11/25/2016  Orientation to time 5 5  Orientation to Place 5 5  Registration 3 2  Registration-comments - HAD TO REPEAT FOR PATIENT  Attention/ Calculation 5 5  Recall 2 3  Language- name 2 objects 2 2  Language- repeat 1 1  Language- follow 3 step command 3 3  Language- read & follow direction 1 1  Write a  sentence 1 1  Copy design 1 1  Total score 29 29     Screening Tests Health Maintenance  Topic Date Due  . URINE MICROALBUMIN  02/17/2018  . INFLUENZA VACCINE  02/26/2018  . TETANUS/TDAP  11/03/2018  . MAMMOGRAM  03/27/2019  . COLONOSCOPY  12/12/2025  . DEXA SCAN  Completed  . Hepatitis C Screening  Completed  . PNA vac Low Risk Adult  Completed     Cancer Screenings: Lung: Low Dose CT Chest recommended if Age 58-80 years, 30 pack-year currently smoking OR have quit w/in 15years. Patient does not qualify. Breast: Up to date on Mammogram? Yes   Up to date of Bone Density/Dexa? Questionable. Documented date of 11/09/15 but no report Colorectal: up to date    Plan:   Keep f/u with PCP for CPE Increase activity level as tolerated. Aim for 150 minutes of moderate activity a week [employer] or [employer] program  I have personally reviewed and noted the following in the patient's chart:   . Medical and social history . Use of alcohol, tobacco or illicit drugs  . Current medications and supplements . Functional ability and status . Nutritional status . Physical activity . Advanced directives . List of other physicians . Hospitalizations, surgeries, and ER visits in previous 12 months . Vitals . Screenings to include cognitive, depression, and falls . Referrals and appointments  In addition, I have reviewed and discussed with patient certain preventive protocols, quality metrics, and best practice recommendations. A written personalized care plan for preventive services as well as general preventive health recommendations were  provided to patient.     Hildred Laser   11/26/2017    I have reviewed and agree with the above AWV documentation.   Evelina Dun, FNP

## 2017-11-28 ENCOUNTER — Ambulatory Visit (INDEPENDENT_AMBULATORY_CARE_PROVIDER_SITE_OTHER): Payer: Medicare HMO | Admitting: Family

## 2017-11-28 ENCOUNTER — Encounter: Payer: Self-pay | Admitting: Family

## 2017-11-28 VITALS — BP 113/70 | HR 79 | Temp 97.5°F | Ht 66.0 in | Wt 213.0 lb

## 2017-11-28 DIAGNOSIS — L02224 Furuncle of groin: Secondary | ICD-10-CM

## 2017-11-28 MED ORDER — CEPHALEXIN 500 MG PO CAPS: 500.0000 mg | ORAL_CAPSULE | Freq: Two times a day (BID) | ORAL | 0 refills | Status: DC

## 2017-11-28 NOTE — Patient Instructions (Signed)
Skin Abscess A skin abscess is an infected area on or under your skin that contains pus and other material. An abscess can happen almost anywhere on your body. Some abscesses break open (rupture) on their own. Most continue to get worse unless they are treated. The infection can spread deeper into the body and into your blood, which can make you feel sick. Treatment usually involves draining the abscess. Follow these instructions at home: Abscess Care  If you have an abscess that has not drained, place a warm, clean, wet washcloth over the abscess several times a day. Do this as told by your doctor.  Follow instructions from your doctor about how to take care of your abscess. Make sure you: ? Cover the abscess with a bandage (dressing). ? Change your bandage or gauze as told by your doctor. ? Wash your hands with soap and water before you change the bandage or gauze. If you cannot use soap and water, use hand sanitizer.  Check your abscess every day for signs that the infection is getting worse. Check for: ? More redness, swelling, or pain. ? More fluid or blood. ? Warmth. ? More pus or a bad smell. Medicines   Take over-the-counter and prescription medicines only as told by your doctor.  If you were prescribed an antibiotic medicine, take it as told by your doctor. Do not stop taking the antibiotic even if you start to feel better. General instructions  To avoid spreading the infection: ? Do not share personal care items, towels, or hot tubs with others. ? Avoid making skin-to-skin contact with other people.  Keep all follow-up visits as told by your doctor. This is important. Contact a doctor if:  You have more redness, swelling, or pain around your abscess.  You have more fluid or blood coming from your abscess.  Your abscess feels warm when you touch it.  You have more pus or a bad smell coming from your abscess.  You have a fever.  Your muscles ache.  You have  chills.  You feel sick. Get help right away if:  You have very bad (severe) pain.  You see red streaks on your skin spreading away from the abscess. This information is not intended to replace advice given to you by your health care provider. Make sure you discuss any questions you have with your health care provider. Document Released: 01/01/2008 Document Revised: 03/10/2016 Document Reviewed: 05/24/2015 Elsevier Interactive Patient Education  2018 Elsevier Inc[employer]  

## 2017-11-28 NOTE — Progress Notes (Addendum)
   Subjective:    Patient ID: Cynthia Leon, female    DOB: Dec 17, 1950, 67 y.o.   MRN: [id number]  Chief Complaint  Patient presents with  . swollen in right groin,upper leg crease   HPI PT presents to the office today with  swollen nodule in her right groin and left groin. Pt states she noticed the left one about a week and half ago and the right one about  three days ago. Denies any tender, warmth, or erythemas.    Denies any fever, dysuria, vaginal discharge, or abdominal pain.   PT worries because her mother had cervical cancer. Pt's last pap was 08/2014  Review of Systems  All other systems reviewed and are negative.      Objective:   Physical Exam  Constitutional: She is oriented to person, place, and time. She appears well-developed and well-nourished. No distress.  HENT:  Head: Normocephalic and atraumatic.  Right Ear: External ear normal.  Mouth/Throat: Oropharynx is clear and moist.  Eyes: Pupils are equal, round, and reactive to light.  Neck: Normal range of motion. Neck supple. No thyromegaly present.  Cardiovascular: Normal rate, regular rhythm, normal heart sounds and intact distal pulses.  No murmur heard. Pulmonary/Chest: Effort normal and breath sounds normal. No respiratory distress. She has no wheezes.  Abdominal: Soft. Bowel sounds are normal. She exhibits no distension. There is no tenderness.  Musculoskeletal: Normal range of motion. She exhibits no edema or tenderness.  Neurological: She is alert and oriented to person, place, and time. She has normal reflexes. No cranial nerve deficit.  Skin: Skin is warm and dry.  furuncle in left groin, no nodules or lymph nodes felt in right groin. I can palpate her inguinal ligament, and believe she has felt this as a nodule  Psychiatric: She has a normal mood and affect. Her behavior is normal. Judgment and thought content normal.  Vitals reviewed.     BP 113/70   Pulse 79   Temp (!) 97.5 F (36.4 C)  (Oral)   Ht 5\' 6"  (1.676 m)   Wt 213 lb (96.6 kg)   BMI 34.38 kg/m      Assessment & Plan:  Cynthia Leon was seen today for swollen in right groin,upper leg crease.  Diagnoses and all orders for this visit:  Furuncle of groin -     cephALEXin (KEFLEX) 500 MG capsule; Take 1 capsule (500 mg total) by mouth 2 (two) times daily.    Will start Keflex today Warm compresses Discussed with patient family hx and her anxiety about this, I suggest her to schedule a pap I did not feel any enlarged lymph nodes in groin or axillary, Report any swollen lymph node RTO prn and schedule pap  Evelina Dun, FNP

## 2017-12-03 ENCOUNTER — Other Ambulatory Visit: Payer: Self-pay | Admitting: Family

## 2017-12-03 ENCOUNTER — Encounter: Payer: Self-pay | Admitting: Family

## 2017-12-03 ENCOUNTER — Ambulatory Visit (INDEPENDENT_AMBULATORY_CARE_PROVIDER_SITE_OTHER): Payer: Medicare HMO | Admitting: Family

## 2017-12-03 VITALS — BP 112/71 | HR 86 | Temp 97.1°F | Ht 66.0 in | Wt 209.0 lb

## 2017-12-03 DIAGNOSIS — Z0000 Encounter for general adult medical examination without abnormal findings: Secondary | ICD-10-CM

## 2017-12-03 DIAGNOSIS — E559 Vitamin D deficiency, unspecified: Secondary | ICD-10-CM | POA: Diagnosis not present

## 2017-12-03 DIAGNOSIS — F411 Generalized anxiety disorder: Secondary | ICD-10-CM

## 2017-12-03 DIAGNOSIS — E785 Hyperlipidemia, unspecified: Secondary | ICD-10-CM | POA: Diagnosis not present

## 2017-12-03 DIAGNOSIS — E669 Obesity, unspecified: Secondary | ICD-10-CM

## 2017-12-03 DIAGNOSIS — Z01419 Encounter for gynecological examination (general) (routine) without abnormal findings: Secondary | ICD-10-CM | POA: Diagnosis not present

## 2017-12-03 DIAGNOSIS — K219 Gastro-esophageal reflux disease without esophagitis: Secondary | ICD-10-CM

## 2017-12-03 DIAGNOSIS — R69 Illness, unspecified: Secondary | ICD-10-CM | POA: Diagnosis not present

## 2017-12-03 DIAGNOSIS — E8881 Metabolic syndrome: Secondary | ICD-10-CM

## 2017-12-03 DIAGNOSIS — M150 Primary generalized (osteo)arthritis: Secondary | ICD-10-CM | POA: Diagnosis not present

## 2017-12-03 DIAGNOSIS — G43009 Migraine without aura, not intractable, without status migrainosus: Secondary | ICD-10-CM | POA: Diagnosis not present

## 2017-12-03 DIAGNOSIS — M159 Polyosteoarthritis, unspecified: Secondary | ICD-10-CM

## 2017-12-03 LAB — URINALYSIS, COMPLETE
Bilirubin, UA: NEGATIVE
GLUCOSE, UA: NEGATIVE
Ketones, UA: NEGATIVE
Nitrite, UA: NEGATIVE
PH UA: 7 (ref 5.0–7.5)
PROTEIN UA: NEGATIVE
RBC, UA: NEGATIVE
Specific Gravity, UA: 1.01 (ref 1.005–1.030)
Urobilinogen, Ur: 0.2 mg/dL (ref 0.2–1.0)

## 2017-12-03 LAB — MICROSCOPIC EXAMINATION: Renal Epithel, UA: NONE SEEN /hpf

## 2017-12-03 NOTE — Progress Notes (Signed)
Subjective:    Patient ID: Cynthia Leon, female    DOB: [birth year], 67 y.o.   MRN: [id number]  Chief Complaint  Patient presents with  . Gynecologic Exam    with pap   PT presents to the office today for CPE with pap.  Gastroesophageal Reflux  She complains of belching, heartburn and a hoarse voice. She reports no coughing. This is a chronic problem. The current episode started more than 1 year ago. The problem occurs constantly. The problem has been waxing and waning. The symptoms are aggravated by certain foods. Risk factors include obesity. She has tried an antacid for the symptoms. The treatment provided mild relief.  Headache   This is a chronic problem. The current episode started more than 1 year ago. The problem occurs monthly. The problem has been resolved (greatly improved). Pertinent negatives include no coughing. Her past medical history is significant for obesity.  Arthritis  Presents for follow-up visit. She complains of pain and stiffness. The symptoms have been worsening. Affected locations include the left shoulder, right shoulder and right wrist. Her pain is at a severity of 8/10.  Hyperlipidemia  This is a chronic problem. The current episode started more than 1 year ago. The problem is controlled. Recent lipid tests were reviewed and are normal. Exacerbating diseases include obesity. Current antihyperlipidemic treatment includes statins. The current treatment provides moderate improvement of lipids. Risk factors for coronary artery disease include dyslipidemia, a sedentary lifestyle, post-menopausal, family history and diabetes mellitus.  Anxiety  Presents for follow-up visit. Symptoms include excessive worry, nervous/anxious behavior and restlessness. Patient reports no confusion, decreased concentration or irritability. Symptoms occur most days.    Gynecologic Exam  The patient's pertinent negatives include no genital lesions, genital odor, missed menses,  vaginal bleeding or vaginal discharge. The patient is experiencing no pain. Associated symptoms include headaches.      Review of Systems  Constitutional: Negative for irritability.  HENT: Positive for hoarse voice.   Respiratory: Negative for cough.   Gastrointestinal: Positive for heartburn.  Genitourinary: Negative for missed menses and vaginal discharge.  Musculoskeletal: Positive for arthritis and stiffness.  Neurological: Positive for headaches.  Psychiatric/Behavioral: Negative for confusion and decreased concentration. The patient is nervous/anxious.   All other systems reviewed and are negative.      Objective:   Physical Exam  Constitutional: She is oriented to person, place, and time. She appears well-developed and well-nourished. No distress.  HENT:  Head: Normocephalic and atraumatic.  Right Ear: External ear normal.  Left Ear: External ear normal.  Mouth/Throat: Oropharynx is clear and moist.  Eyes: Pupils are equal, round, and reactive to light.  Neck: Normal range of motion. Neck supple. No thyromegaly present.  Cardiovascular: Normal rate, regular rhythm, normal heart sounds and intact distal pulses.  No murmur heard. Pulmonary/Chest: Effort normal and breath sounds normal. No respiratory distress. She has no wheezes. Right breast exhibits no inverted nipple, no mass, no nipple discharge, no skin change and no tenderness. Left breast exhibits no inverted nipple, no mass, no nipple discharge, no skin change and no tenderness. No breast swelling, tenderness, discharge or bleeding. Breasts are symmetrical.  Abdominal: Soft. Bowel sounds are normal. She exhibits no distension. There is no tenderness.  Genitourinary: Vagina normal.  Genitourinary Comments: Bimanual exam- no adnexal masses or tenderness, ovaries nonpalpable   Cervix parous and pink- No discharge   Musculoskeletal: Normal range of motion. She exhibits no edema or tenderness.  Neurological: She is alert  and  oriented to person, place, and time. She has normal reflexes.  Skin: Skin is warm and dry.  Psychiatric: She has a normal mood and affect. Her behavior is normal. Judgment and thought content normal.  Vitals reviewed.    BP 112/71   Pulse 86   Temp (!) 97.1 F (36.2 C) (Oral)   Ht '5\' 6"'  (1.676 m)   Wt 209 lb (94.8 kg)   BMI 33.73 kg/m      Assessment & Plan:  Cynthia Leon comes in today with chief complaint of Gynecologic Exam (with pap)   Diagnosis and orders addressed:  1. Annual physical exam - CMP14+EGFR - Lipid panel - TSH - VITAMIN D 25 Hydroxy (Vit-D Deficiency, Fractures) - CBC with Differential/Platelet  2. Gynecologic exam normal - Urinalysis, Complete - CMP14+EGFR - CBC with Differential/Platelet  3. Gastroesophageal reflux disease, esophagitis presence not specified - CMP14+EGFR - CBC with Differential/Platelet  4. Hyperlipidemia, unspecified hyperlipidemia type - CMP14+EGFR - Lipid panel - CBC with Differential/Platelet  5. GAD (generalized anxiety disorder) - CMP14+EGFR - CBC with Differential/Platelet  6. Metabolic syndrome - SNK53+ZJQB - CBC with Differential/Platelet  7. Obesity (BMI 30.0-34.9) - CMP14+EGFR - CBC with Differential/Platelet  8. Migraine without aura and without status migrainosus, not intractable - CMP14+EGFR - CBC with Differential/Platelet  9. Primary osteoarthritis involving multiple joints - CMP14+EGFR - CBC with Differential/Platelet  10. Vitamin D deficiency - VITAMIN D 25 Hydroxy (Vit-D Deficiency, Fractures)   Labs pending Health Maintenance reviewed Diet and exercise encouraged  Follow up plan: 6 months    Evelina Dun, FNP

## 2017-12-03 NOTE — Patient Instructions (Signed)

## 2017-12-04 LAB — CBC WITH DIFFERENTIAL/PLATELET
Basophils Absolute: 0.1 10*3/uL (ref 0.0–0.2)
Basos: 1 %
EOS (ABSOLUTE): 0.3 10*3/uL (ref 0.0–0.4)
Eos: 4 %
HEMOGLOBIN: 13.2 g/dL (ref 11.1–15.9)
Hematocrit: 39.7 % (ref 34.0–46.6)
IMMATURE GRANS (ABS): 0 10*3/uL (ref 0.0–0.1)
Immature Granulocytes: 0 %
LYMPHS: 28 %
Lymphocytes Absolute: 2.1 10*3/uL (ref 0.7–3.1)
MCH: 27.8 pg (ref 26.6–33.0)
MCHC: 33.2 g/dL (ref 31.5–35.7)
MCV: 84 fL (ref 79–97)
MONOCYTES: 8 %
Monocytes Absolute: 0.6 10*3/uL (ref 0.1–0.9)
NEUTROS ABS: 4.3 10*3/uL (ref 1.4–7.0)
Neutrophils: 59 %
PLATELETS: 301 10*3/uL (ref 150–379)
RBC: 4.74 x10E6/uL (ref 3.77–5.28)
RDW: 13.5 % (ref 12.3–15.4)
WBC: 7.3 10*3/uL (ref 3.4–10.8)

## 2017-12-04 LAB — CMP14+EGFR
A/G RATIO: 1.9 (ref 1.2–2.2)
ALBUMIN: 4.3 g/dL (ref 3.6–4.8)
ALK PHOS: 80 IU/L (ref 39–117)
ALT: 13 IU/L (ref 0–32)
AST: 6 IU/L (ref 0–40)
BILIRUBIN TOTAL: 0.3 mg/dL (ref 0.0–1.2)
BUN / CREAT RATIO: 9 — AB (ref 12–28)
BUN: 7 mg/dL — AB (ref 8–27)
CHLORIDE: 102 mmol/L (ref 96–106)
CO2: 21 mmol/L (ref 20–29)
Calcium: 9.8 mg/dL (ref 8.7–10.3)
Creatinine, Ser: 0.81 mg/dL (ref 0.57–1.00)
GFR calc non Af Amer: 75 mL/min/{1.73_m2} (ref >59)
GFR, EST AFRICAN AMERICAN: 87 mL/min/{1.73_m2} (ref >59)
GLUCOSE: 91 mg/dL (ref 65–99)
Globulin, Total: 2.3 g/dL (ref 1.5–4.5)
Potassium: 4.4 mmol/L (ref 3.5–5.2)
Sodium: 139 mmol/L (ref 134–144)
TOTAL PROTEIN: 6.6 g/dL (ref 6.0–8.5)

## 2017-12-04 LAB — LIPID PANEL
CHOLESTEROL TOTAL: 161 mg/dL (ref 100–199)
Chol/HDL Ratio: 3.8 ratio (ref 0.0–4.4)
HDL: 42 mg/dL (ref >39)
LDL Calculated: 83 mg/dL (ref 0–99)
Triglycerides: 180 mg/dL — ABNORMAL HIGH (ref 0–149)
VLDL CHOLESTEROL CAL: 36 mg/dL (ref 5–40)

## 2017-12-04 LAB — TSH: TSH: 2.06 u[IU]/mL (ref 0.450–4.500)

## 2017-12-04 LAB — VITAMIN D 25 HYDROXY (VIT D DEFICIENCY, FRACTURES): VIT D 25 HYDROXY: 27.8 ng/mL — AB (ref 30.0–100.0)

## 2017-12-05 LAB — PAP IG (IMAGE GUIDED): PAP SMEAR COMMENT: 0

## 2017-12-23 ENCOUNTER — Other Ambulatory Visit: Payer: Self-pay | Admitting: Family

## 2017-12-23 DIAGNOSIS — E785 Hyperlipidemia, unspecified: Secondary | ICD-10-CM

## 2017-12-30 ENCOUNTER — Other Ambulatory Visit: Payer: Self-pay | Admitting: Family

## 2017-12-30 DIAGNOSIS — F411 Generalized anxiety disorder: Secondary | ICD-10-CM

## 2018-01-01 ENCOUNTER — Other Ambulatory Visit: Payer: Self-pay | Admitting: Family

## 2018-01-23 ENCOUNTER — Other Ambulatory Visit: Payer: Self-pay | Admitting: Family

## 2018-01-23 NOTE — Telephone Encounter (Signed)
Last seen 12/03/17  Cynthia Leon  This med no on her list

## 2018-01-26 ENCOUNTER — Other Ambulatory Visit: Payer: Self-pay | Admitting: Family

## 2018-01-26 DIAGNOSIS — J301 Allergic rhinitis due to pollen: Secondary | ICD-10-CM

## 2018-01-31 ENCOUNTER — Other Ambulatory Visit: Payer: Self-pay | Admitting: Family

## 2018-02-01 ENCOUNTER — Other Ambulatory Visit: Payer: Self-pay | Admitting: Family

## 2018-02-04 ENCOUNTER — Other Ambulatory Visit: Payer: Self-pay | Admitting: Family

## 2018-02-04 DIAGNOSIS — F411 Generalized anxiety disorder: Secondary | ICD-10-CM

## 2018-02-05 NOTE — Telephone Encounter (Signed)
Last seen 12/03/17  Cynthia Leon

## 2018-03-17 ENCOUNTER — Encounter: Payer: Self-pay | Admitting: Nurse Practitioner

## 2018-03-17 ENCOUNTER — Ambulatory Visit (INDEPENDENT_AMBULATORY_CARE_PROVIDER_SITE_OTHER): Payer: Medicare HMO | Admitting: Nurse Practitioner

## 2018-03-17 VITALS — BP 136/81 | HR 97 | Temp 97.4°F | Ht 66.0 in | Wt 213.0 lb

## 2018-03-17 DIAGNOSIS — M545 Low back pain, unspecified: Secondary | ICD-10-CM

## 2018-03-17 DIAGNOSIS — R300 Dysuria: Secondary | ICD-10-CM

## 2018-03-17 DIAGNOSIS — N3001 Acute cystitis with hematuria: Secondary | ICD-10-CM

## 2018-03-17 LAB — URINALYSIS
BILIRUBIN UA: NEGATIVE
GLUCOSE, UA: NEGATIVE
KETONES UA: NEGATIVE
Nitrite, UA: NEGATIVE
Protein, UA: NEGATIVE
Specific Gravity, UA: 1.01 (ref 1.005–1.030)
UUROB: 0.2 mg/dL (ref 0.2–1.0)
pH, UA: 7 (ref 5.0–7.5)

## 2018-03-17 MED ORDER — CIPROFLOXACIN HCL 500 MG PO TABS: 500.0000 mg | ORAL_TABLET | Freq: Two times a day (BID) | ORAL | 0 refills | Status: DC

## 2018-03-17 NOTE — Patient Instructions (Signed)

## 2018-03-17 NOTE — Progress Notes (Signed)
   Subjective:    Patient ID: Cynthia Leon, female    DOB: 1951/07/14, 67 y.o.   MRN: [id number]   Chief Complaint: Urinary Tract Infection (urinary frequency with LBP x 1 wk)   HPI Patient comes in today c/o dysuria and low back pain for over a week. She has had increasing urgency and frequency.   Review of Systems  Constitutional: Negative.   Respiratory: Negative.   Cardiovascular: Negative.   Genitourinary: Positive for dysuria and urgency. Negative for pelvic pain.  Musculoskeletal: Positive for back pain.  Neurological: Negative.   Psychiatric/Behavioral: Negative.   All other systems reviewed and are negative.      Objective:   Physical Exam  Constitutional: She is oriented to person, place, and time. She appears well-developed and well-nourished.  Cardiovascular: Normal rate.  Pulmonary/Chest: Effort normal.  Abdominal: Soft.  Genitourinary:  Genitourinary Comments: No CVA tenderness  Musculoskeletal:  FROM of lumbar without pain (-)SLR bil Motor strength and sensation distally intact  Neurological: She is alert and oriented to person, place, and time.  Skin: Skin is warm and dry.  Psychiatric: She has a normal mood and affect. Her behavior is normal. Thought content normal.   BP 136/81 (BP Location: Left Arm, Patient Position: Sitting, Cuff Size: Normal)   Pulse 97   Temp (!) 97.4 F (36.3 C) (Oral)   Ht 5\' 6"  (1.676 m)   Wt 213 lb (96.6 kg)   BMI 34.38 kg/m   urone + blood           +++ leuks       Assessment & Plan:  Cynthia Leon in today with chief complaint of Urinary Tract Infection (urinary frequency with LBP x 1 wk)   1. Dysuria - Urinalysis - Urine Culture  2. Acute cystitis with hematuria Take medication as prescribe Cotton underwear Take shower not bath Cranberry juice, yogurt Force fluids AZO over the counter X2 days Culture pending RTO prn Meds ordered this encounter  Medications  . ciprofloxacin (Leon)  500 MG tablet    Sig: Take 1 tablet (500 mg total) by mouth 2 (two) times daily.    Dispense:  10 tablet    Refill:  0    Order Specific Question:   Supervising Provider    Answer:   VINCENT, CAROL L [[id number]]     3. Acute midline low back pain without sciatica Moist heat to back' motrin q6 hours for the next 2 days RTO prn  Mary-Margaret Hassell Done, FNP

## 2018-03-20 ENCOUNTER — Other Ambulatory Visit: Payer: Self-pay | Admitting: Family

## 2018-03-21 LAB — URINE CULTURE

## 2018-04-09 ENCOUNTER — Other Ambulatory Visit: Payer: Self-pay | Admitting: Family

## 2018-04-09 DIAGNOSIS — F411 Generalized anxiety disorder: Secondary | ICD-10-CM

## 2018-04-10 NOTE — Telephone Encounter (Signed)
Last seen 8.20.19

## 2018-04-24 ENCOUNTER — Other Ambulatory Visit: Payer: Self-pay | Admitting: Family

## 2018-04-30 ENCOUNTER — Other Ambulatory Visit: Payer: Self-pay | Admitting: Family

## 2018-05-08 ENCOUNTER — Ambulatory Visit (INDEPENDENT_AMBULATORY_CARE_PROVIDER_SITE_OTHER): Payer: Medicare HMO | Admitting: Family Medicine

## 2018-05-08 ENCOUNTER — Encounter: Payer: Self-pay | Admitting: Family Medicine

## 2018-05-08 VITALS — BP 119/80 | HR 97 | Temp 97.9°F | Ht 66.0 in | Wt 214.2 lb

## 2018-05-08 DIAGNOSIS — M797 Fibromyalgia: Secondary | ICD-10-CM | POA: Diagnosis not present

## 2018-05-08 DIAGNOSIS — M150 Primary generalized (osteo)arthritis: Secondary | ICD-10-CM | POA: Diagnosis not present

## 2018-05-08 DIAGNOSIS — M159 Polyosteoarthritis, unspecified: Secondary | ICD-10-CM

## 2018-05-08 MED ORDER — KETOROLAC TROMETHAMINE 60 MG/2ML IM SOLN
60.0000 mg | Freq: Once | INTRAMUSCULAR | Status: AC
  Administered 2018-05-08: 60 mg via INTRAMUSCULAR

## 2018-05-08 NOTE — Progress Notes (Signed)
BP 119/80   Pulse 97   Temp 97.9 F (36.6 C) (Oral)   Ht 5\' 6"  (1.676 m)   Wt 214 lb 3.2 oz (97.2 kg)   BMI 34.57 kg/m    Subjective:    Patient ID: Cynthia Leon, female    DOB: 1950/08/21, 67 y.o.   MRN: [id number]  HPI: Cynthia Leon is a 67 y.o. female presenting on 05/08/2018 for Joint Pain   HPI Muscle and joint pain Patient is coming in complaining of muscle and joint pain is diffuse but mostly in her neck shoulders arms and legs.  She says she has gotten like this frequently and she does get fibromyalgia.  She says ever since the weather change she is been significantly worse over the past 3 days.  She denies any fevers or chills or shortness of breath or wheezing.  She denies anything helping her necessarily with this.  She has been using Tylenol.  She also takes Cymbalta and Flexeril for this and they do help but it has been worse over the past 3 days which is what is bringing her in today.  She does see her PCP next week but she feels like she cannot wait because of how severe the pain was getting.  She says she usually stays active right now she has been inhibited and cannot stay active because of these issues.  Relevant past medical, surgical, family and social history reviewed and updated as indicated. Interim medical history since our last visit reviewed. Allergies and medications reviewed and updated.  Review of Systems  Constitutional: Negative for chills and fever.  Eyes: Negative for visual disturbance.  Respiratory: Negative for chest tightness and shortness of breath.   Cardiovascular: Negative for chest pain and leg swelling.  Musculoskeletal: Positive for back pain, myalgias and neck pain. Negative for gait problem.  Skin: Negative for rash.  Neurological: Negative for light-headedness and headaches.  Psychiatric/Behavioral: Negative for agitation and behavioral problems.  All other systems reviewed and are negative.   Per HPI unless  specifically indicated above   Allergies as of 05/08/2018      Reactions   Mobic [meloxicam] Nausea And Vomiting   Asa [aspirin] Nausea And Vomiting      Medication List        Accurate as of 05/08/18  9:55 AM. Always use your most recent med list.          ALPRAZolam 0.5 MG tablet Commonly known as:  XANAX TAKE 1 TABLET BY MOUTH EVERY DAY AT BEDTIME AS NEEDED   DULoxetine 20 MG capsule Commonly known as:  CYMBALTA TAKE 1 CAPSULE (20 MG TOTAL) BY MOUTH DAILY. FOR FIBROMYALGIA AND MOOD.   fluticasone 50 MCG/ACT nasal spray Commonly known as:  FLONASE SPRAY 2 SPRAYS INTO EACH NOSTRIL EVERY DAY   multivitamin with minerals tablet Take 1 tablet by mouth daily.   MYLANTA PO Take by mouth as needed.   Omeprazole-Sodium Bicarbonate 20-1100 MG Caps capsule Commonly known as:  ZEGERID Take 1 capsule by mouth daily before breakfast.   rosuvastatin 20 MG tablet Commonly known as:  CRESTOR TAKE 1 TABLET (20 MG TOTAL) DAILY BY MOUTH.   topiramate 50 MG tablet Commonly known as:  TOPAMAX TAKE ONE TABLET BY MOUTH TWICE DAILY   Vitamin D (Ergocalciferol) 50000 units Caps capsule Commonly known as:  DRISDOL TAKE 1 CAPSULE (50,000 UNITS TOTAL) BY MOUTH EVERY 7 (SEVEN) DAYS.          Objective:  BP 119/80   Pulse 97   Temp 97.9 F (36.6 C) (Oral)   Ht 5\' 6"  (1.676 m)   Wt 214 lb 3.2 oz (97.2 kg)   BMI 34.57 kg/m   Wt Readings from Last 3 Encounters:  05/08/18 214 lb 3.2 oz (97.2 kg)  03/17/18 213 lb (96.6 kg)  12/03/17 209 lb (94.8 kg)    Physical Exam  Constitutional: She is oriented to person, place, and time. She appears well-developed and well-nourished. No distress.  Eyes: Conjunctivae are normal.  Cardiovascular: Normal rate, regular rhythm, normal heart sounds and intact distal pulses.  No murmur heard. Pulmonary/Chest: Effort normal and breath sounds normal. No respiratory distress. She has no wheezes.  Musculoskeletal: Normal range of motion. She  exhibits tenderness (Diffuse mild tenderness in the neck and arms and legs.  Mostly seems muscular in nature, not specific to any joint). She exhibits no edema.  Neurological: She is alert and oriented to person, place, and time. Coordination normal.  Skin: Skin is warm and dry. No rash noted. She is not diaphoretic.  Psychiatric: She has a normal mood and affect. Her behavior is normal.  Nursing note and vitals reviewed.     Assessment & Plan:   Problem List Items Addressed This Visit      Musculoskeletal and Integument   Osteoarthritis - Primary   Relevant Medications   ketorolac (TORADOL) injection 60 mg (Completed)     Other   Fibromyalgia   Relevant Medications   ketorolac (TORADOL) injection 60 mg (Completed)      Gave patient a Toradol injection today to see if help and recommended that she speak with her PCP over change in Cymbalta and possibly increasing Cymbalta to help with her fibromyalgia.  Patient says she cannot tolerate oral prednisone or oral NSAIDs because of abdominal issues, continue Flexeril Follow up plan: Return if symptoms worsen or fail to improve.  Counseling provided for all of the vaccine components No orders of the defined types were placed in this encounter.   Caryl Pina, MD [facility] 05/08/2018, 9:55 AM

## 2018-05-18 ENCOUNTER — Encounter: Payer: Self-pay | Admitting: Family

## 2018-05-18 ENCOUNTER — Ambulatory Visit (INDEPENDENT_AMBULATORY_CARE_PROVIDER_SITE_OTHER): Payer: Medicare HMO | Admitting: Family

## 2018-05-18 VITALS — BP 118/76 | HR 79 | Temp 97.8°F | Ht 66.0 in | Wt 215.0 lb

## 2018-05-18 DIAGNOSIS — M150 Primary generalized (osteo)arthritis: Secondary | ICD-10-CM | POA: Diagnosis not present

## 2018-05-18 DIAGNOSIS — M797 Fibromyalgia: Secondary | ICD-10-CM

## 2018-05-18 DIAGNOSIS — M159 Polyosteoarthritis, unspecified: Secondary | ICD-10-CM

## 2018-05-18 MED ORDER — DULOXETINE HCL 20 MG PO CPEP: 40.0000 mg | ORAL_CAPSULE | Freq: Every day | ORAL | 1 refills | Status: DC

## 2018-05-18 NOTE — Progress Notes (Signed)
   Subjective:    Patient ID: Cynthia Leon, female    DOB: [birth year], 67 y.o.   MRN: [id number]  Chief Complaint  Patient presents with  . joint pain recheck   Pt presents to the office today to recheck Osteoarthritis and Fibromyalgia pain.  Arthritis  Presents for follow-up visit. She complains of pain, stiffness and joint warmth. The symptoms have been worsening. Affected locations include the neck, left shoulder, right shoulder, right foot and left foot (lower back). Her pain is at a severity of 8/10.      Review of Systems  Musculoskeletal: Positive for arthritis and stiffness.  All other systems reviewed and are negative.      Objective:   Physical Exam  Constitutional: She is oriented to person, place, and time. She appears well-developed and well-nourished. No distress.  HENT:  Head: Normocephalic.  Eyes: Pupils are equal, round, and reactive to light.  Neck: Normal range of motion. Neck supple. No thyromegaly present.  Cardiovascular: Normal rate, regular rhythm, normal heart sounds and intact distal pulses.  No murmur heard. Pulmonary/Chest: Effort normal and breath sounds normal. No respiratory distress. She has no wheezes.  Abdominal: Soft. Bowel sounds are normal. She exhibits no distension. There is no tenderness.  Musculoskeletal: Normal range of motion. She exhibits no edema or tenderness.  Neurological: She is alert and oriented to person, place, and time. She has normal reflexes. No cranial nerve deficit.  Skin: Skin is warm and dry.  Psychiatric: She has a normal mood and affect. Her behavior is normal. Judgment and thought content normal.  Vitals reviewed.     BP 118/76   Pulse 79   Temp 97.8 F (36.6 C) (Oral)   Ht 5\' 6"  (1.676 m)   Wt 215 lb (97.5 kg)   BMI 34.70 kg/m      Assessment & Plan:  Cynthia Leon comes in today with chief complaint of joint pain recheck   Diagnosis and orders addressed:  1. Primary osteoarthritis  involving multiple joints - DULoxetine (CYMBALTA) 20 MG capsule; Take 2 capsules (40 mg total) by mouth daily. For fibromyalgia and mood.  Dispense: 180 capsule; Refill: 1  2. Fibromyalgia - DULoxetine (CYMBALTA) 20 MG capsule; Take 2 capsules (40 mg total) by mouth daily. For fibromyalgia and mood.  Dispense: 180 capsule; Refill: 1   We will increase Cymbalta to 40 mg from 20 mg. We will schedule follow up in 6 weeks and will increase to 60 mg Continue with ROM exercises  Health Maintenance reviewed   Follow up plan: 6 weeks    Cynthia Dun, FNP

## 2018-05-18 NOTE — Patient Instructions (Signed)
Myofascial Pain Syndrome and Fibromyalgia Myofascial pain syndrome and fibromyalgia are both pain disorders. This pain may be felt mainly in your muscles.  Myofascial pain syndrome: ? Always has trigger points or tender points in the muscle that will cause pain when pressed. The pain may come and go. ? Usually affects your neck, upper back, and shoulder areas. The pain often radiates into your arms and hands.  Fibromyalgia: ? Has muscle pains and tenderness that come and go. ? Is often associated with fatigue and sleep disturbances. ? Has trigger points. ? Tends to be long-lasting (chronic), but is not life-threatening.  Fibromyalgia and myofascial pain are not the same. However, they often occur together. If you have both conditions, each can make the other worse. Both are common and can cause enough pain and fatigue to make day-to-day activities difficult. What are the causes? The exact causes of fibromyalgia and myofascial pain are not known. People with certain gene types may be more likely to develop fibromyalgia. Some factors can be triggers for both conditions, such as:  Spine disorders.  Arthritis.  Severe injury (trauma) and other physical stressors.  Being under a lot of stress.  A medical illness.  What are the signs or symptoms? Fibromyalgia The main symptom of fibromyalgia is widespread pain and tenderness in your muscles. This can vary over time. Pain is sometimes described as stabbing, shooting, or burning. You may have tingling or numbness, too. You may also have sleep problems and fatigue. You may wake up feeling tired and groggy (fibro fog). Other symptoms may include:  Bowel and bladder problems.  Headaches.  Visual problems.  Problems with odors and noises.  Depression or mood changes.  Painful menstrual periods (dysmenorrhea).  Dry skin or eyes.  Myofascial pain syndrome Symptoms of myofascial pain syndrome include:  Tight, ropy bands of  muscle.  Uncomfortable sensations in muscular areas, such as: ? Aching. ? Cramping. ? Burning. ? Numbness. ? Tingling. ? Muscle weakness.  Trouble moving certain muscles freely (range of motion).  How is this diagnosed? There are no specific tests to diagnose fibromyalgia or myofascial pain syndrome. Both can be hard to diagnose because their symptoms are common in many other conditions. Your health care provider may suspect one or both of these conditions based on your symptoms and medical history. Your health care provider will also do a physical exam. The key to diagnosing fibromyalgia is having pain, fatigue, and other symptoms for more than three months that cannot be explained by another condition. The key to diagnosing myofascial pain syndrome is finding trigger points in muscles that are tender and cause pain elsewhere in your body (referred pain). How is this treated? Treating fibromyalgia and myofascial pain often requires a team of health care providers. This usually starts with your primary provider and a physical therapist. You may also find it helpful to work with alternative health care providers, such as massage therapists or acupuncturists. Treatment for fibromyalgia may include medicines. This may include nonsteroidal anti-inflammatory drugs (NSAIDs), along with other medicines. Treatment for myofascial pain may also include:  NSAIDs.  Cooling and stretching of muscles.  Trigger point injections.  Sound wave (ultrasound) treatments to stimulate muscles.  Follow these instructions at home:  Take medicines only as directed by your health care provider.  Exercise as directed by your health care provider or physical therapist.  Try to avoid stressful situations.  Practice relaxation techniques to control your stress. You may want to try: ? Biofeedback. ? Visual   imagery. ? Hypnosis. ? Muscle relaxation. ? Yoga. ? Meditation.  Talk to your health care provider  about alternative treatments, such as acupuncture or massage treatment.  Maintain a healthy lifestyle. This includes eating a healthy diet and getting enough sleep.  Consider joining a support group.  Do not do activities that stress or strain your muscles. That includes repetitive motions and heavy lifting. Where to find more information:  National Fibromyalgia Association: www.fmaware.org  Arthritis Foundation: www.arthritis.org  American Chronic Pain Association: www.theacpa.org/condition/myofascial-pain Contact a health care provider if:  You have new symptoms.  Your symptoms get worse.  You have side effects from your medicines.  You have trouble sleeping.  Your condition is causing depression or anxiety. This information is not intended to replace advice given to you by your health care provider. Make sure you discuss any questions you have with your health care provider. Document Released: 07/15/2005 Document Revised: 12/21/2015 Document Reviewed: 04/20/2014 Elsevier Interactive Patient Education  2018 Elsevier Inc.  

## 2018-06-08 ENCOUNTER — Other Ambulatory Visit: Payer: Self-pay | Admitting: Family

## 2018-06-08 DIAGNOSIS — F411 Generalized anxiety disorder: Secondary | ICD-10-CM

## 2018-06-09 ENCOUNTER — Other Ambulatory Visit: Payer: Self-pay | Admitting: Family

## 2018-06-18 ENCOUNTER — Other Ambulatory Visit: Payer: Self-pay | Admitting: Family

## 2018-06-18 DIAGNOSIS — E785 Hyperlipidemia, unspecified: Secondary | ICD-10-CM

## 2018-06-29 ENCOUNTER — Ambulatory Visit (INDEPENDENT_AMBULATORY_CARE_PROVIDER_SITE_OTHER): Payer: Medicare HMO | Admitting: Family

## 2018-06-29 ENCOUNTER — Encounter: Payer: Self-pay | Admitting: Family

## 2018-06-29 VITALS — BP 123/75 | HR 90 | Temp 97.1°F | Ht 66.0 in | Wt 215.4 lb

## 2018-06-29 DIAGNOSIS — F411 Generalized anxiety disorder: Secondary | ICD-10-CM | POA: Diagnosis not present

## 2018-06-29 DIAGNOSIS — M150 Primary generalized (osteo)arthritis: Secondary | ICD-10-CM

## 2018-06-29 DIAGNOSIS — E785 Hyperlipidemia, unspecified: Secondary | ICD-10-CM

## 2018-06-29 DIAGNOSIS — E669 Obesity, unspecified: Secondary | ICD-10-CM | POA: Diagnosis not present

## 2018-06-29 DIAGNOSIS — M159 Polyosteoarthritis, unspecified: Secondary | ICD-10-CM

## 2018-06-29 DIAGNOSIS — M797 Fibromyalgia: Secondary | ICD-10-CM

## 2018-06-29 DIAGNOSIS — R69 Illness, unspecified: Secondary | ICD-10-CM | POA: Diagnosis not present

## 2018-06-29 MED ORDER — CYCLOBENZAPRINE HCL 10 MG PO TABS: 10.0000 mg | ORAL_TABLET | Freq: Three times a day (TID) | ORAL | 5 refills | Status: DC | PRN

## 2018-06-29 MED ORDER — DULOXETINE HCL 60 MG PO CPEP: 60.0000 mg | ORAL_CAPSULE | Freq: Every day | ORAL | 1 refills | Status: DC

## 2018-06-29 MED ORDER — ALPRAZOLAM 0.5 MG PO TABS: ORAL_TABLET | ORAL | 5 refills | Status: DC

## 2018-06-29 NOTE — Progress Notes (Signed)
Subjective:    Patient ID: Cynthia Leon, female    DOB: 05-16-1951, 67 y.o.   MRN: [id number]  Chief Complaint  Patient presents with  . follow up joint pain   Pt presents to the office today to recheck Osteoarthritis and Fibromyalgia pain. She is currently taking Cymbalta 40 mg daily.  She reports increased worsening muscle pain.  Arthritis  Presents for follow-up visit. She complains of pain, stiffness, joint swelling and joint warmth. The symptoms have been worsening. Affected locations include the right knee, left knee, left shoulder and right shoulder (lower back). Her pain is at a severity of 9/10.  Anxiety  Presents for follow-up visit. Symptoms include depressed mood, excessive worry, irritability, nervous/anxious behavior and restlessness. Symptoms occur most days. The severity of symptoms is moderate.    Hyperlipidemia  This is a chronic problem. The current episode started more than 1 year ago. The problem is controlled. Recent lipid tests were reviewed and are normal. Exacerbating diseases include obesity. Associated symptoms include myalgias. Current antihyperlipidemic treatment includes statins. The current treatment provides moderate improvement of lipids. Risk factors for coronary artery disease include diabetes mellitus, hypertension, a sedentary lifestyle and obesity.      Review of Systems  Constitutional: Positive for irritability.  Musculoskeletal: Positive for arthralgias, arthritis, joint swelling, myalgias and stiffness.  Psychiatric/Behavioral: The patient is nervous/anxious.   All other systems reviewed and are negative.     Objective:   Physical Exam  Constitutional: She is oriented to person, place, and time. She appears well-developed and well-nourished. No distress.  HENT:  Head: Normocephalic and atraumatic.  Right Ear: External ear normal.  Left Ear: External ear normal.  Mouth/Throat: Oropharynx is clear and moist.  Eyes: Pupils are  equal, round, and reactive to light.  Neck: Normal range of motion. Neck supple. No thyromegaly present.  Cardiovascular: Normal rate, regular rhythm, normal heart sounds and intact distal pulses.  No murmur heard. Pulmonary/Chest: Effort normal and breath sounds normal. No respiratory distress. She has no wheezes.  Abdominal: Soft. Bowel sounds are normal. She exhibits no distension. There is no tenderness.  Musculoskeletal: Normal range of motion. She exhibits no edema or tenderness.  Full ROM of joint, mild pain in lumbar with flexion  Neurological: She is alert and oriented to person, place, and time. She has normal reflexes. No cranial nerve deficit.  Skin: Skin is warm and dry.  Psychiatric: Her behavior is normal. Judgment and thought content normal. Her mood appears anxious.  Vitals reviewed.     BP 123/75   Pulse 90   Temp (!) 97.1 F (36.2 C) (Oral)   Ht '5\' 6"'  (1.676 m)   Wt 215 lb 6.4 oz (97.7 kg)   BMI 34.77 kg/m      Assessment & Plan:  Cynthia Leon comes in today with chief complaint of follow up joint pain   Diagnosis and orders addressed:  1. GAD (generalized anxiety disorder) We will increase Cymbalta to 60 mg from 40 mg - ALPRAZolam (XANAX) 0.5 MG tablet; TAKE 1 TABLET BY MOUTH EVERY DAY AT BEDTIME AS NEEDED  Dispense: 30 tablet; Refill: 5 - DULoxetine (CYMBALTA) 60 MG capsule; Take 1 capsule (60 mg total) by mouth daily.  Dispense: 90 capsule; Refill: 1 - CMP14+EGFR  2. Primary osteoarthritis involving multiple joints - CMP14+EGFR  3. Fibromyalgia We will increase Cymbalta to 60 mg from 40 mg and add flexeril as needed for pain Encourage daily exercise - DULoxetine (CYMBALTA) 60 MG capsule;  Take 1 capsule (60 mg total) by mouth daily.  Dispense: 90 capsule; Refill: 1 - cyclobenzaprine (FLEXERIL) 10 MG tablet; Take 1 tablet (10 mg total) by mouth 3 (three) times daily as needed for muscle spasms.  Dispense: 90 tablet; Refill: 5 -  CMP14+EGFR  4. Hyperlipidemia, unspecified hyperlipidemia type - CMP14+EGFR  5. Obesity (BMI 30.0-34.9) - CMP14+EGFR   Labs pending Health Maintenance reviewed Diet and exercise encouraged  Follow up plan: 3 months    Evelina Dun, FNP

## 2018-06-29 NOTE — Patient Instructions (Signed)
Myofascial Pain Syndrome and Fibromyalgia Myofascial pain syndrome and fibromyalgia are both pain disorders. This pain may be felt mainly in your muscles.  Myofascial pain syndrome: ? Always has trigger points or tender points in the muscle that will cause pain when pressed. The pain may come and go. ? Usually affects your neck, upper back, and shoulder areas. The pain often radiates into your arms and hands.  Fibromyalgia: ? Has muscle pains and tenderness that come and go. ? Is often associated with fatigue and sleep disturbances. ? Has trigger points. ? Tends to be long-lasting (chronic), but is not life-threatening.  Fibromyalgia and myofascial pain are not the same. However, they often occur together. If you have both conditions, each can make the other worse. Both are common and can cause enough pain and fatigue to make day-to-day activities difficult. What are the causes? The exact causes of fibromyalgia and myofascial pain are not known. People with certain gene types may be more likely to develop fibromyalgia. Some factors can be triggers for both conditions, such as:  Spine disorders.  Arthritis.  Severe injury (trauma) and other physical stressors.  Being under a lot of stress.  A medical illness.  What are the signs or symptoms? Fibromyalgia The main symptom of fibromyalgia is widespread pain and tenderness in your muscles. This can vary over time. Pain is sometimes described as stabbing, shooting, or burning. You may have tingling or numbness, too. You may also have sleep problems and fatigue. You may wake up feeling tired and groggy (fibro fog). Other symptoms may include:  Bowel and bladder problems.  Headaches.  Visual problems.  Problems with odors and noises.  Depression or mood changes.  Painful menstrual periods (dysmenorrhea).  Dry skin or eyes.  Myofascial pain syndrome Symptoms of myofascial pain syndrome include:  Tight, ropy bands of  muscle.  Uncomfortable sensations in muscular areas, such as: ? Aching. ? Cramping. ? Burning. ? Numbness. ? Tingling. ? Muscle weakness.  Trouble moving certain muscles freely (range of motion).  How is this diagnosed? There are no specific tests to diagnose fibromyalgia or myofascial pain syndrome. Both can be hard to diagnose because their symptoms are common in many other conditions. Your health care provider may suspect one or both of these conditions based on your symptoms and medical history. Your health care provider will also do a physical exam. The key to diagnosing fibromyalgia is having pain, fatigue, and other symptoms for more than three months that cannot be explained by another condition. The key to diagnosing myofascial pain syndrome is finding trigger points in muscles that are tender and cause pain elsewhere in your body (referred pain). How is this treated? Treating fibromyalgia and myofascial pain often requires a team of health care providers. This usually starts with your primary provider and a physical therapist. You may also find it helpful to work with alternative health care providers, such as massage therapists or acupuncturists. Treatment for fibromyalgia may include medicines. This may include nonsteroidal anti-inflammatory drugs (NSAIDs), along with other medicines. Treatment for myofascial pain may also include:  NSAIDs.  Cooling and stretching of muscles.  Trigger point injections.  Sound wave (ultrasound) treatments to stimulate muscles.  Follow these instructions at home:  Take medicines only as directed by your health care provider.  Exercise as directed by your health care provider or physical therapist.  Try to avoid stressful situations.  Practice relaxation techniques to control your stress. You may want to try: ? Biofeedback. ? Visual   imagery. ? Hypnosis. ? Muscle relaxation. ? Yoga. ? Meditation.  Talk to your health care provider  about alternative treatments, such as acupuncture or massage treatment.  Maintain a healthy lifestyle. This includes eating a healthy diet and getting enough sleep.  Consider joining a support group.  Do not do activities that stress or strain your muscles. That includes repetitive motions and heavy lifting. Where to find more information:  National Fibromyalgia Association: www.fmaware.org  Arthritis Foundation: www.arthritis.org  American Chronic Pain Association: www.theacpa.org/condition/myofascial-pain Contact a health care provider if:  You have new symptoms.  Your symptoms get worse.  You have side effects from your medicines.  You have trouble sleeping.  Your condition is causing depression or anxiety. This information is not intended to replace advice given to you by your health care provider. Make sure you discuss any questions you have with your health care provider. Document Released: 07/15/2005 Document Revised: 12/21/2015 Document Reviewed: 04/20/2014 Elsevier Interactive Patient Education  2018 Elsevier Inc.  

## 2018-06-30 LAB — CMP14+EGFR
A/G RATIO: 2.1 (ref 1.2–2.2)
ALBUMIN: 4.4 g/dL (ref 3.6–4.8)
ALT: 16 IU/L (ref 0–32)
AST: 15 IU/L (ref 0–40)
Alkaline Phosphatase: 80 IU/L (ref 39–117)
BILIRUBIN TOTAL: 0.3 mg/dL (ref 0.0–1.2)
BUN / CREAT RATIO: 9 — AB (ref 12–28)
BUN: 7 mg/dL — ABNORMAL LOW (ref 8–27)
CALCIUM: 9.9 mg/dL (ref 8.7–10.3)
CO2: 22 mmol/L (ref 20–29)
Chloride: 103 mmol/L (ref 96–106)
Creatinine, Ser: 0.82 mg/dL (ref 0.57–1.00)
GFR, EST AFRICAN AMERICAN: 86 mL/min/{1.73_m2} (ref >59)
GFR, EST NON AFRICAN AMERICAN: 74 mL/min/{1.73_m2} (ref >59)
GLOBULIN, TOTAL: 2.1 g/dL (ref 1.5–4.5)
Glucose: 89 mg/dL (ref 65–99)
POTASSIUM: 4.4 mmol/L (ref 3.5–5.2)
Sodium: 140 mmol/L (ref 134–144)
TOTAL PROTEIN: 6.5 g/dL (ref 6.0–8.5)

## 2018-08-02 ENCOUNTER — Other Ambulatory Visit: Payer: Self-pay | Admitting: Family

## 2018-08-26 ENCOUNTER — Other Ambulatory Visit: Payer: Self-pay | Admitting: Family

## 2018-09-14 ENCOUNTER — Ambulatory Visit (INDEPENDENT_AMBULATORY_CARE_PROVIDER_SITE_OTHER): Payer: Medicare HMO | Admitting: Family

## 2018-09-14 ENCOUNTER — Encounter: Payer: Self-pay | Admitting: Family

## 2018-09-14 VITALS — BP 122/76 | HR 73 | Temp 98.2°F | Ht 66.0 in | Wt 215.2 lb

## 2018-09-14 DIAGNOSIS — J0100 Acute maxillary sinusitis, unspecified: Secondary | ICD-10-CM

## 2018-09-14 DIAGNOSIS — J209 Acute bronchitis, unspecified: Secondary | ICD-10-CM | POA: Diagnosis not present

## 2018-09-14 MED ORDER — AMOXICILLIN-POT CLAVULANATE 875-125 MG PO TABS: 1.0000 | ORAL_TABLET | Freq: Two times a day (BID) | ORAL | 0 refills | Status: DC

## 2018-09-14 MED ORDER — BENZONATATE 200 MG PO CAPS: 200.0000 mg | ORAL_CAPSULE | Freq: Three times a day (TID) | ORAL | 1 refills | Status: DC | PRN

## 2018-09-14 MED ORDER — PREDNISONE 10 MG (21) PO TBPK: ORAL_TABLET | ORAL | 0 refills | Status: DC

## 2018-09-14 NOTE — Patient Instructions (Signed)

## 2018-09-14 NOTE — Progress Notes (Signed)
Subjective:    Patient ID: Cynthia Leon, female    DOB: [birth year], 68 y.o.   MRN: [id number]  Chief Complaint  Patient presents with  . Dizziness  . Cough with green sputum    Dizziness  This is a new problem. The current episode started in the past 7 days. The problem occurs constantly. The problem has been waxing and waning. Associated symptoms include chills, congestion, coughing, fatigue, headaches, myalgias and vertigo. Pertinent negatives include no fever or sore throat.  Cough  This is a new problem. The current episode started in the past 7 days. The problem has been gradually worsening. The problem occurs every few minutes. The cough is productive of sputum. Associated symptoms include chills, headaches, myalgias, nasal congestion, postnasal drip, rhinorrhea and wheezing. Pertinent negatives include no ear congestion, ear pain, fever or sore throat. She has tried rest and OTC cough suppressant for the symptoms. The treatment provided mild relief.      Review of Systems  Constitutional: Positive for chills and fatigue. Negative for fever.  HENT: Positive for congestion, postnasal drip and rhinorrhea. Negative for ear pain and sore throat.   Respiratory: Positive for cough and wheezing.   Musculoskeletal: Positive for myalgias.  Neurological: Positive for dizziness, vertigo and headaches.  All other systems reviewed and are negative.      Objective:   Physical Exam Vitals signs reviewed.  Constitutional:      General: She is not in acute distress.    Appearance: She is well-developed.  HENT:     Head: Normocephalic and atraumatic.     Right Ear: Tympanic membrane normal.     Left Ear: Tympanic membrane normal.     Nose: Mucosal edema and rhinorrhea present.     Right Sinus: Maxillary sinus tenderness present.     Left Sinus: Maxillary sinus tenderness present.     Mouth/Throat:     Pharynx: Posterior oropharyngeal erythema present.  Eyes:     Pupils:  Pupils are equal, round, and reactive to light.  Neck:     Musculoskeletal: Normal range of motion and neck supple.     Thyroid: No thyromegaly.  Cardiovascular:     Rate and Rhythm: Normal rate and regular rhythm.     Heart sounds: Normal heart sounds. No murmur.  Pulmonary:     Effort: Pulmonary effort is normal. No respiratory distress.     Breath sounds: Normal breath sounds. No wheezing.     Comments: Intermittent coarse nonproductive cough  Abdominal:     General: Bowel sounds are normal. There is no distension.     Palpations: Abdomen is soft.     Tenderness: There is no abdominal tenderness.  Musculoskeletal: Normal range of motion.        General: No tenderness.  Skin:    General: Skin is warm and dry.  Neurological:     Mental Status: She is alert and oriented to person, place, and time.     Cranial Nerves: No cranial nerve deficit.     Deep Tendon Reflexes: Reflexes are normal and symmetric.  Psychiatric:        Behavior: Behavior normal.        Thought Content: Thought content normal.        Judgment: Judgment normal.       BP 122/76   Pulse 73   Temp 98.2 F (36.8 C) (Oral)   Ht 5\' 6"  (1.676 m)   Wt 215 lb 3.2 oz (97.6 kg)  BMI 34.73 kg/m      Assessment & Plan:  HADJA HARRAL comes in today with chief complaint of Dizziness and Cough with green sputum   Diagnosis and orders addressed:  1. Acute bronchitis, unspecified organism - Take meds as prescribed - Use a cool mist humidifier  -Use saline nose sprays frequently -Force fluids -For any cough or congestion  Use plain Mucinex- regular strength or max strength is fine -For fever or aces or pains- take tylenol or ibuprofen. -Throat lozenges if help -RTO if symptoms worsen or do not improve  - amoxicillin-clavulanate (AUGMENTIN) 875-125 MG tablet; Take 1 tablet by mouth 2 (two) times daily.  Dispense: 14 tablet; Refill: 0 - predniSONE (STERAPRED UNI-PAK 21 TAB) 10 MG (21) TBPK tablet;  Use as directed  Dispense: 21 tablet; Refill: 0 - benzonatate (TESSALON) 200 MG capsule; Take 1 capsule (200 mg total) by mouth 3 (three) times daily as needed.  Dispense: 30 capsule; Refill: 1  2. Acute maxillary sinusitis, recurrence not specified - amoxicillin-clavulanate (AUGMENTIN) 875-125 MG tablet; Take 1 tablet by mouth 2 (two) times daily.  Dispense: 14 tablet; Refill: 0 - predniSONE (STERAPRED UNI-PAK 21 TAB) 10 MG (21) TBPK tablet; Use as directed  Dispense: 21 tablet; Refill: 0   Evelina Dun, FNP

## 2018-09-20 ENCOUNTER — Other Ambulatory Visit: Payer: Self-pay | Admitting: Family

## 2018-09-20 DIAGNOSIS — E785 Hyperlipidemia, unspecified: Secondary | ICD-10-CM

## 2018-09-29 ENCOUNTER — Ambulatory Visit (INDEPENDENT_AMBULATORY_CARE_PROVIDER_SITE_OTHER): Payer: Medicare HMO | Admitting: Family

## 2018-09-29 ENCOUNTER — Encounter: Payer: Self-pay | Admitting: Family

## 2018-09-29 VITALS — BP 122/80 | HR 91 | Temp 97.0°F | Ht 66.0 in | Wt 218.2 lb

## 2018-09-29 DIAGNOSIS — M150 Primary generalized (osteo)arthritis: Secondary | ICD-10-CM

## 2018-09-29 DIAGNOSIS — R69 Illness, unspecified: Secondary | ICD-10-CM | POA: Diagnosis not present

## 2018-09-29 DIAGNOSIS — F1320 Sedative, hypnotic or anxiolytic dependence, uncomplicated: Secondary | ICD-10-CM | POA: Insufficient documentation

## 2018-09-29 DIAGNOSIS — E785 Hyperlipidemia, unspecified: Secondary | ICD-10-CM

## 2018-09-29 DIAGNOSIS — Z7689 Persons encountering health services in other specified circumstances: Secondary | ICD-10-CM | POA: Diagnosis not present

## 2018-09-29 DIAGNOSIS — G43009 Migraine without aura, not intractable, without status migrainosus: Secondary | ICD-10-CM

## 2018-09-29 DIAGNOSIS — M797 Fibromyalgia: Secondary | ICD-10-CM

## 2018-09-29 DIAGNOSIS — E8881 Metabolic syndrome: Secondary | ICD-10-CM

## 2018-09-29 DIAGNOSIS — K219 Gastro-esophageal reflux disease without esophagitis: Secondary | ICD-10-CM

## 2018-09-29 DIAGNOSIS — K5900 Constipation, unspecified: Secondary | ICD-10-CM | POA: Diagnosis not present

## 2018-09-29 DIAGNOSIS — E559 Vitamin D deficiency, unspecified: Secondary | ICD-10-CM | POA: Diagnosis not present

## 2018-09-29 DIAGNOSIS — E1169 Type 2 diabetes mellitus with other specified complication: Secondary | ICD-10-CM | POA: Diagnosis not present

## 2018-09-29 DIAGNOSIS — R52 Pain, unspecified: Secondary | ICD-10-CM

## 2018-09-29 DIAGNOSIS — Z79899 Other long term (current) drug therapy: Secondary | ICD-10-CM | POA: Insufficient documentation

## 2018-09-29 DIAGNOSIS — M159 Polyosteoarthritis, unspecified: Secondary | ICD-10-CM

## 2018-09-29 DIAGNOSIS — F411 Generalized anxiety disorder: Secondary | ICD-10-CM

## 2018-09-29 MED ORDER — ALPRAZOLAM 0.5 MG PO TABS: ORAL_TABLET | ORAL | 5 refills | Status: DC

## 2018-09-29 NOTE — Patient Instructions (Signed)
Myofascial Pain Syndrome and Fibromyalgia Myofascial pain syndrome and fibromyalgia are both pain disorders. This pain may be felt mainly in your muscles.  Myofascial pain syndrome: ? Always has tender points in the muscle that will cause pain when pressed (trigger points). The pain may come and go. ? Usually affects your neck, upper back, and shoulder areas. The pain often radiates into your arms and hands.  Fibromyalgia: ? Has muscle pains and tenderness that come and go. ? Is often associated with fatigue and sleep problems. ? Has trigger points. ? Tends to be long-lasting (chronic), but is not life-threatening. Fibromyalgia and myofascial pain syndrome are not the same. However, they often occur together. If you have both conditions, each can make the other worse. Both are common and can cause enough pain and fatigue to make day-to-day activities difficult. Both can be hard to diagnose because their symptoms are common in many other conditions. What are the causes? The exact causes of these conditions are not known. What increases the risk? You are more likely to develop this condition if:  You have a family history of the condition.  You have certain triggers, such as: ? Spine disorders. ? An injury (trauma) or other physical stressors. ? Being under a lot of stress. ? Medical conditions such as osteoarthritis, rheumatoid arthritis, or lupus. What are the signs or symptoms? Fibromyalgia The main symptom of fibromyalgia is widespread pain and tenderness in your muscles. Pain is sometimes described as stabbing, shooting, or burning. You may also have:  Tingling or numbness.  Sleep problems and fatigue.  Problems with attention and concentration (fibro fog). Other symptoms may include:  Bowel and bladder problems.  Headaches.  Visual problems.  Problems with odors and noises.  Depression or mood changes.  Painful menstrual periods (dysmenorrhea).  Dry skin or eyes.  These symptoms can vary over time. Myofascial pain syndrome Symptoms of myofascial pain syndrome include:  Tight, ropy bands of muscle.  Uncomfortable sensations in muscle areas. These may include aching, cramping, burning, numbness, tingling, and weakness.  Difficulty moving certain parts of the body freely (poor range of motion). How is this diagnosed? This condition may be diagnosed by your symptoms and medical history. You will also have a physical exam. In general:  Fibromyalgia is diagnosed if you have pain, fatigue, and other symptoms for more than 3 months, and symptoms cannot be explained by another condition.  Myofascial pain syndrome is diagnosed if you have trigger points in your muscles, and those trigger points are tender and cause pain elsewhere in your body (referred pain). How is this treated? Treatment for these conditions depends on the type that you have.  For fibromyalgia: ? Pain medicines, such as NSAIDs. ? Medicines for treating depression. ? Medicines for treating seizures. ? Medicines that relax the muscles.  For myofascial pain: ? Pain medicines, such as NSAIDs. ? Cooling and stretching of muscles. ? Trigger point injections. ? Sound wave (ultrasound) treatments to stimulate muscles. Treating these conditions often requires a team of health care providers. These may include:  Your primary care provider.  Physical therapist.  Complementary health care providers, such as massage therapists or acupuncturists.  Psychiatrist for cognitive behavioral therapy. Follow these instructions at home: Medicines  Take over-the-counter and prescription medicines only as told by your health care provider.  Do not drive or use heavy machinery while taking prescription pain medicine.  If you are taking prescription pain medicine, take actions to prevent or treat constipation. Your health care   provider may recommend that you: ? Drink enough fluid to keep your  urine pale yellow. ? Eat foods that are high in fiber, such as fresh fruits and vegetables, whole grains, and beans. ? Limit foods that are high in fat and processed sugars, such as fried or sweet foods. ? Take an over-the-counter or prescription medicine for constipation. Lifestyle   Exercise as directed by your health care provider or physical therapist.  Practice relaxation techniques to control your stress. You may want to try: ? Biofeedback. ? Visual imagery. ? Hypnosis. ? Muscle relaxation. ? Yoga. ? Meditation.  Maintain a healthy lifestyle. This includes eating a healthy diet and getting enough sleep.  Do not use any products that contain nicotine or tobacco, such as cigarettes and e-cigarettes. If you need help quitting, ask your health care provider. General instructions  Talk to your health care provider about complementary treatments, such as acupuncture or massage.  Consider joining a support group with others who are diagnosed with this condition.  Do not do activities that stress or strain your muscles. This includes repetitive motions and heavy lifting.  Keep all follow-up visits as told by your health care provider. This is important. Where to find more information  National Fibromyalgia Association: www.fmaware.org  Arthritis Foundation: www.arthritis.org  American Chronic Pain Association: www.theacpa.org Contact a health care provider if:  You have new symptoms.  Your symptoms get worse or your pain is severe.  You have side effects from your medicines.  You have trouble sleeping.  Your condition is causing depression or anxiety. Summary  Myofascial pain syndrome and fibromyalgia are pain disorders.  Myofascial pain syndrome has tender points in the muscle that will cause pain when pressed (trigger points). Fibromyalgia also has muscle pains and tenderness that come and go, but this condition is often associated with fatigue and sleep  disturbances.  Fibromyalgia and myofascial pain syndrome are not the same but often occur together, causing pain and fatigue that make day-to-day activities difficult.  Treatment for fibromyalgia includes taking medicines to relax the muscles and medicines for pain, depression, or seizures. Treatment for myofascial pain syndrome includes taking medicines for pain, cooling and stretching of muscles, and injecting medicines into trigger points.  Follow your health care provider's instructions for taking medicines and maintaining a healthy lifestyle. This information is not intended to replace advice given to you by your health care provider. Make sure you discuss any questions you have with your health care provider. Document Released: 07/15/2005 Document Revised: 07/30/2017 Document Reviewed: 07/30/2017 Elsevier Interactive Patient Education  2019 Elsevier Inc.  

## 2018-09-29 NOTE — Addendum Note (Signed)
Addended by: Evelina Dun A on: 09/29/2018 10:17 AM   Modules accepted: Orders

## 2018-09-29 NOTE — Addendum Note (Signed)
Addended by: Shelbie Ammons on: 09/29/2018 10:57 AM   Modules accepted: Orders

## 2018-09-29 NOTE — Progress Notes (Signed)
Subjective:    Patient ID: Cynthia Leon, female    DOB: 1950-12-07, 68 y.o.   MRN: [id number]  Chief Complaint  Patient presents with  . Medical Management of Chronic Issues    three month recheck   Pt presents to the office today to recheck Osteoarthritis and Fibromyalgia pain. She is currently taking Cymbalta 60 mg daily.   Gastroesophageal Reflux  She reports no belching, no coughing or no heartburn. This is a chronic problem. The problem occurs occasionally. The problem has been waxing and waning. She has tried a PPI for the symptoms. The treatment provided moderate relief.  Arthritis  Presents for follow-up visit. She complains of pain and stiffness. The symptoms have been stable. Affected locations include the right MCP, left MCP, right knee and left knee. Her pain is at a severity of 9/10.  Migraine   This is a chronic problem. The current episode started more than 1 year ago. The problem occurs intermittently. The pain quality is not similar to prior headaches. Pertinent negatives include no coughing. Her past medical history is significant for migraine headaches.  Anxiety  Presents for follow-up visit. Symptoms include depressed mood, excessive worry, irritability, nervous/anxious behavior and restlessness. Symptoms occur occasionally. The severity of symptoms is moderate.    Constipation  This is a chronic problem. The current episode started more than 1 year ago. The problem has been waxing and waning since onset. She has tried laxatives for the symptoms. The treatment provided moderate relief.  Metabolic Syndrome PT takes crestor daily. Does not do any scheduled exercise.    Review of Systems  Constitutional: Positive for irritability.  Respiratory: Negative for cough.   Gastrointestinal: Positive for constipation. Negative for heartburn.  Musculoskeletal: Positive for arthritis and stiffness.  Psychiatric/Behavioral: The patient is nervous/anxious.          Objective:   Physical Exam Vitals signs reviewed.  Constitutional:      General: She is not in acute distress.    Appearance: She is well-developed.  HENT:     Head: Normocephalic and atraumatic.     Right Ear: Tympanic membrane normal.     Left Ear: Tympanic membrane normal.  Eyes:     Pupils: Pupils are equal, round, and reactive to light.  Neck:     Musculoskeletal: Normal range of motion and neck supple.     Thyroid: No thyromegaly.  Cardiovascular:     Rate and Rhythm: Normal rate and regular rhythm.     Heart sounds: Normal heart sounds. No murmur.  Pulmonary:     Effort: Pulmonary effort is normal. No respiratory distress.     Breath sounds: Normal breath sounds. No wheezing.  Abdominal:     General: Bowel sounds are normal. There is no distension.     Palpations: Abdomen is soft.     Tenderness: There is no abdominal tenderness.  Musculoskeletal: Normal range of motion.        General: No tenderness.  Skin:    General: Skin is warm and dry.  Neurological:     Mental Status: She is alert and oriented to person, place, and time.     Cranial Nerves: No cranial nerve deficit.     Deep Tendon Reflexes: Reflexes are normal and symmetric.  Psychiatric:        Mood and Affect: Mood is anxious.        Behavior: Behavior normal.        Thought Content: Thought content normal.  Judgment: Judgment normal.       BP 122/80   Pulse 91   Temp (!) 97 F (36.1 C) (Oral)   Ht 5' 6" (1.676 m)   Wt 218 lb 3.2 oz (99 kg)   BMI 35.22 kg/m      Assessment & Plan:  Cynthia Leon comes in today with chief complaint of Medical Management of Chronic Issues (three month recheck)   Diagnosis and orders addressed:  1. Migraine without aura and without status migrainosus, not intractable - CMP14+EGFR - CBC with Differential/Platelet  2. Gastroesophageal reflux disease, esophagitis presence not specified - CMP14+EGFR - CBC with Differential/Platelet  3.  Primary osteoarthritis involving multiple joints - CMP14+EGFR - CBC with Differential/Platelet  4. Constipation, unspecified constipation type - CMP14+EGFR - CBC with Differential/Platelet  5. Fibromyalgia - CMP14+EGFR - CBC with Differential/Platelet  6. GAD (generalized anxiety disorder) - CMP14+EGFR - CBC with Differential/Platelet - ALPRAZolam (XANAX) 0.5 MG tablet; TAKE 1 TABLET BY MOUTH EVERY DAY AT BEDTIME AS NEEDED  Dispense: 30 tablet; Refill: 5  7. Hyperlipidemia, unspecified hyperlipidemia type - CMP14+EGFR - CBC with Differential/Platelet - Lipid panel  8. Metabolic syndrome - TUU82+CMKL - CBC with Differential/Platelet  9. Vitamin D deficiency - CMP14+EGFR - CBC with Differential/Platelet  10. Morbid obesity ([place]) - CMP14+EGFR - CBC with Differential/Platelet  11. Controlled substance agreement signed  12. Benzodiazepine dependence ([place]) -Pt reviewed in [place] controlled database, no red flags noted   Labs pending Health Maintenance reviewed Diet and exercise encouraged  Follow up plan: 6 months    Cynthia Dun, FNP

## 2018-09-30 LAB — CMP14+EGFR
ALT: 19 IU/L (ref 0–32)
AST: 9 IU/L (ref 0–40)
Albumin/Globulin Ratio: 1.8 (ref 1.2–2.2)
Albumin: 4.2 g/dL (ref 3.8–4.8)
Alkaline Phosphatase: 72 IU/L (ref 39–117)
BUN/Creatinine Ratio: 9 — ABNORMAL LOW (ref 12–28)
BUN: 6 mg/dL — AB (ref 8–27)
Bilirubin Total: 0.3 mg/dL (ref 0.0–1.2)
CALCIUM: 9.7 mg/dL (ref 8.7–10.3)
CO2: 24 mmol/L (ref 20–29)
CREATININE: 0.68 mg/dL (ref 0.57–1.00)
Chloride: 100 mmol/L (ref 96–106)
GFR calc Af Amer: 104 mL/min/{1.73_m2} (ref >59)
GFR, EST NON AFRICAN AMERICAN: 90 mL/min/{1.73_m2} (ref >59)
Globulin, Total: 2.3 g/dL (ref 1.5–4.5)
Glucose: 129 mg/dL — ABNORMAL HIGH (ref 65–99)
Potassium: 4.2 mmol/L (ref 3.5–5.2)
Sodium: 137 mmol/L (ref 134–144)
Total Protein: 6.5 g/dL (ref 6.0–8.5)

## 2018-09-30 LAB — LIPID PANEL
Chol/HDL Ratio: 3.9 ratio (ref 0.0–4.4)
Cholesterol, Total: 171 mg/dL (ref 100–199)
HDL: 44 mg/dL (ref >39)
LDL Calculated: 86 mg/dL (ref 0–99)
Triglycerides: 206 mg/dL — ABNORMAL HIGH (ref 0–149)
VLDL Cholesterol Cal: 41 mg/dL — ABNORMAL HIGH (ref 5–40)

## 2018-09-30 LAB — CBC WITH DIFFERENTIAL/PLATELET
Basophils Absolute: 0.1 10*3/uL (ref 0.0–0.2)
Basos: 1 %
EOS (ABSOLUTE): 0.4 10*3/uL (ref 0.0–0.4)
EOS: 4 %
Hematocrit: 40.7 % (ref 34.0–46.6)
Hemoglobin: 13.5 g/dL (ref 11.1–15.9)
Immature Grans (Abs): 0 10*3/uL (ref 0.0–0.1)
Immature Granulocytes: 0 %
Lymphocytes Absolute: 2.1 10*3/uL (ref 0.7–3.1)
Lymphs: 22 %
MCH: 28.8 pg (ref 26.6–33.0)
MCHC: 33.2 g/dL (ref 31.5–35.7)
MCV: 87 fL (ref 79–97)
Monocytes Absolute: 0.7 10*3/uL (ref 0.1–0.9)
Monocytes: 8 %
Neutrophils Absolute: 6.2 10*3/uL (ref 1.4–7.0)
Neutrophils: 65 %
Platelets: 318 10*3/uL (ref 150–450)
RBC: 4.68 x10E6/uL (ref 3.77–5.28)
RDW: 13.2 % (ref 11.7–15.4)
WBC: 9.5 10*3/uL (ref 3.4–10.8)

## 2018-10-01 ENCOUNTER — Other Ambulatory Visit: Payer: Self-pay | Admitting: Family

## 2018-10-02 ENCOUNTER — Telehealth: Payer: Self-pay | Admitting: Family

## 2018-10-02 LAB — TOXASSURE SELECT 13 (MW), URINE

## 2018-10-02 NOTE — Telephone Encounter (Signed)
Pt talked to nurse yesterday about lab work but is wanting to talk to nurse again about it

## 2018-10-02 NOTE — Telephone Encounter (Signed)
Patient aware of lab results and verbalizes understanding.  

## 2018-10-03 LAB — SPECIMEN STATUS REPORT

## 2018-10-03 LAB — HGB A1C W/O EAG: Hgb A1c MFr Bld: 6 % — ABNORMAL HIGH (ref 4.8–5.6)

## 2018-10-06 ENCOUNTER — Other Ambulatory Visit: Payer: Self-pay | Admitting: Family

## 2018-10-06 DIAGNOSIS — E1169 Type 2 diabetes mellitus with other specified complication: Secondary | ICD-10-CM

## 2018-10-20 ENCOUNTER — Encounter: Payer: Self-pay | Admitting: Nutrition

## 2018-10-20 ENCOUNTER — Other Ambulatory Visit: Payer: Self-pay

## 2018-10-20 ENCOUNTER — Encounter: Payer: Medicare HMO | Attending: Family | Admitting: Nutrition

## 2018-10-20 DIAGNOSIS — R739 Hyperglycemia, unspecified: Secondary | ICD-10-CM | POA: Diagnosis not present

## 2018-10-20 DIAGNOSIS — E669 Obesity, unspecified: Secondary | ICD-10-CM | POA: Diagnosis not present

## 2018-10-20 NOTE — Patient Instructions (Addendum)
Goals Follow My Plate Eat more fresh fruits and vegetables Drink only water Cut out snacks. Eat meals on time B) 6-8 L)12-2 and dinner 5-7 pm Walk 30 minutes Don't eat past 7 pm. Cut out processed foods

## 2018-10-20 NOTE — Progress Notes (Signed)
Telephone Visit for PreDM due to Covid 19 She lives with husband. A1C 6%. Just diagnosed with PreDM.  She has recently cut back on fried foods and sweets. Eating better balanced meals now. Cooks at home. Doesn't eat out.   Has been watching what she has been eating since being told about her BS.Marland Kitchen Exercise: Walking. Use to weigh 239 lbs and down to 212 lbs.   She notes she is going to meet with an educator at [employer] in April.  B) Cherrios with blueberries, coffee L) Cauliflower and corn chips D) Chicken noodle soup or ham and veggies.  Lab Results  Component Value Date   HGBA1C 6.0 (H) 09/29/2018   CMP Latest Ref Rng & Units 09/29/2018 06/29/2018 12/03/2017  Glucose 65 - 99 mg/dL 129(H) 89 91  BUN 8 - 27 mg/dL 6(L) 7(L) 7(L)  Creatinine 0.57 - 1.00 mg/dL 0.68 0.82 0.81  Sodium 134 - 144 mmol/L 137 140 139  Potassium 3.5 - 5.2 mmol/L 4.2 4.4 4.4  Chloride 96 - 106 mmol/L 100 103 102  CO2 20 - 29 mmol/L 24 22 21   Calcium 8.7 - 10.3 mg/dL 9.7 9.9 9.8  Total Protein 6.0 - 8.5 g/dL 6.5 6.5 6.6  Total Bilirubin 0.0 - 1.2 mg/dL 0.3 0.3 0.3  Alkaline Phos 39 - 117 IU/L 72 80 80  AST 0 - 40 IU/L 9 15 6   ALT 0 - 32 IU/L 19 16 13    Wt Readings from Last 3 Encounters:  09/29/18 218 lb 3.2 oz (99 kg)  09/14/18 215 lb 3.2 oz (97.6 kg)  06/29/18 215 lb 6.4 oz (97.7 kg)   Ht Readings from Last 3 Encounters:  09/29/18 5\' 6"  (1.676 m)  09/14/18 5\' 6"  (1.676 m)  06/29/18 5\' 6"  (1.676 m)   There is no height or weight on file to calculate BMI. @BMIFA @ Facility age limit for growth percentiles is 20 years. Facility age limit for growth percentiles is 20 years.  Plan:  Goals  Goals Follow My Plate Eat more fresh fruits and vegetables Drink only water Cut out snacks. Eat meals on time B) 6-8 L)12-2 and dinner 5-7 pm Walk 30 minutes Don't eat past 7 pm. Cut out processed foods  Will follow up in 2 months

## 2018-10-29 ENCOUNTER — Other Ambulatory Visit: Payer: Self-pay | Admitting: Family

## 2018-11-01 ENCOUNTER — Other Ambulatory Visit: Payer: Self-pay | Admitting: Family

## 2018-11-01 DIAGNOSIS — J301 Allergic rhinitis due to pollen: Secondary | ICD-10-CM

## 2018-11-12 ENCOUNTER — Other Ambulatory Visit: Payer: Self-pay | Admitting: Family

## 2018-11-24 ENCOUNTER — Telehealth: Payer: Self-pay | Admitting: Family

## 2018-11-25 ENCOUNTER — Other Ambulatory Visit: Payer: Self-pay

## 2018-11-25 ENCOUNTER — Encounter: Payer: Self-pay | Admitting: Physician Assistant

## 2018-11-25 ENCOUNTER — Ambulatory Visit (INDEPENDENT_AMBULATORY_CARE_PROVIDER_SITE_OTHER): Payer: Medicare HMO | Admitting: Physician Assistant

## 2018-11-25 VITALS — BP 131/84 | HR 96 | Temp 98.5°F | Ht 66.0 in | Wt 214.0 lb

## 2018-11-25 DIAGNOSIS — L03115 Cellulitis of right lower limb: Secondary | ICD-10-CM | POA: Diagnosis not present

## 2018-11-25 DIAGNOSIS — W450XXA Nail entering through skin, initial encounter: Secondary | ICD-10-CM | POA: Diagnosis not present

## 2018-11-25 DIAGNOSIS — Z23 Encounter for immunization: Secondary | ICD-10-CM

## 2018-11-25 DIAGNOSIS — S91331D Puncture wound without foreign body, right foot, subsequent encounter: Secondary | ICD-10-CM

## 2018-11-25 MED ORDER — CEPHALEXIN 500 MG PO CAPS: 500.0000 mg | ORAL_CAPSULE | Freq: Four times a day (QID) | ORAL | 0 refills | Status: DC

## 2018-11-25 NOTE — Progress Notes (Signed)
BP 131/84   Pulse 96   Temp 98.5 F (36.9 C) (Oral)   Ht 5\' 6"  (1.676 m)   Wt 214 lb (97.1 kg)   BMI 34.54 kg/m    Subjective:    Patient ID: Cynthia Leon, female    DOB: Sep 06, 1950, 68 y.o.   MRN: [id number]  HPI: Cynthia Leon is a 68 y.o. female presenting on 11/25/2018 for Toe Injury  On November 22, 2018 the patient was walking on her front porch.  She felt a sharp pain in her right foot on the pad just past her toe.  She looked down and it was a nail head that was sticking up and had injured her.  She states that it was rusty.  She is not up-to-date on her tetanus vaccines.  So she is due a Td today.  This will be administered.  We will also treat her cellulitis with Keflex.  She does have a little bit of redness and tenderness around the open wound.  Past Medical History:  Diagnosis Date  . Abdominal pain   . Allergic rhinitis   . Anxiety   . Constipation   . Fibromyalgia   . GERD (gastroesophageal reflux disease)   . Head ache   . Hyperlipidemia   . Osteopenia   . PUD (peptic ulcer disease)   . Right hip pain   . Shingles    Relevant past medical, surgical, family and social history reviewed and updated as indicated. Interim medical history since our last visit reviewed. Allergies and medications reviewed and updated. DATA REVIEWED: CHART IN EPIC  Family History reviewed for pertinent findings.  Review of Systems  Constitutional: Negative.   HENT: Negative.   Eyes: Negative.   Respiratory: Negative.   Gastrointestinal: Negative.   Genitourinary: Negative.   Skin: Positive for color change and wound.    Allergies as of 11/25/2018      Reactions   Mobic [meloxicam] Nausea And Vomiting   Asa [aspirin] Nausea And Vomiting      Medication List       Accurate as of November 25, 2018 10:44 AM. Always use your most recent med list.        ALPRAZolam 0.5 MG tablet Commonly known as:  XANAX TAKE 1 TABLET BY MOUTH EVERY DAY AT BEDTIME AS NEEDED   benzonatate 200 MG capsule Commonly known as:  TESSALON Take 1 capsule (200 mg total) by mouth 3 (three) times daily as needed.   cephALEXin 500 MG capsule Commonly known as:  KEFLEX Take 1 capsule (500 mg total) by mouth 4 (four) times daily.   cyclobenzaprine 10 MG tablet Commonly known as:  FLEXERIL Take 1 tablet (10 mg total) by mouth 3 (three) times daily as needed for muscle spasms.   DULoxetine 60 MG capsule Commonly known as:  Cymbalta Take 1 capsule (60 mg total) by mouth daily.   fluticasone 50 MCG/ACT nasal spray Commonly known as:  FLONASE USE 2 SPRAYS IN EACH NOSTRIL ONCE DAILY   multivitamin with minerals tablet Take 1 tablet by mouth daily.   MYLANTA PO Take by mouth as needed.   Omeprazole-Sodium Bicarbonate 20-1100 MG Caps capsule Commonly known as:  Zegerid OTC Take 1 capsule by mouth daily before breakfast.   rosuvastatin 20 MG tablet Commonly known as:  CRESTOR TAKE 1 TABLET (20 MG TOTAL) DAILY BY MOUTH.   topiramate 50 MG tablet Commonly known as:  TOPAMAX TAKE 1 TABLET BY MOUTH TWICE A DAY  Vitamin D (Ergocalciferol) 1.25 MG (50000 UT) Caps capsule Commonly known as:  DRISDOL TAKE 1 CAPSULE (50,000 UNITS TOTAL) BY MOUTH EVERY 7 (SEVEN) DAYS.          Objective:    BP 131/84   Pulse 96   Temp 98.5 F (36.9 C) (Oral)   Ht 5\' 6"  (1.676 m)   Wt 214 lb (97.1 kg)   BMI 34.54 kg/m   Allergies  Allergen Reactions  . Mobic [Meloxicam] Nausea And Vomiting  . Asa [Aspirin] Nausea And Vomiting    Wt Readings from Last 3 Encounters:  11/25/18 214 lb (97.1 kg)  09/29/18 218 lb 3.2 oz (99 kg)  09/14/18 215 lb 3.2 oz (97.6 kg)    Physical Exam Constitutional:      Appearance: She is well-developed.  HENT:     Head: Normocephalic and atraumatic.  Eyes:     Conjunctiva/sclera: Conjunctivae normal.     Pupils: Pupils are equal, round, and reactive to light.  Cardiovascular:     Rate and Rhythm: Normal rate and regular rhythm.      Heart sounds: Normal heart sounds.  Musculoskeletal:     Right foot: Tenderness present.       Feet:     Comments: There is redness and swelling around the center puncture wound.  There is no drainage at this time.  Skin:    General: Skin is warm and dry.     Findings: No rash.  Neurological:     Mental Status: She is alert and oriented to person, place, and time.     Deep Tendon Reflexes: Reflexes are normal and symmetric.  Psychiatric:        Behavior: Behavior normal.        Thought Content: Thought content normal.        Judgment: Judgment normal.         Assessment & Plan:   1. Cellulitis of right lower extremity - cephALEXin (KEFLEX) 500 MG capsule; Take 1 capsule (500 mg total) by mouth 4 (four) times daily.  Dispense: 40 capsule; Refill: 0  2. Injury by nail, initial encounter - cephALEXin (KEFLEX) 500 MG capsule; Take 1 capsule (500 mg total) by mouth 4 (four) times daily.  Dispense: 40 capsule; Refill: 0 Td vaccine administered  Continue all other maintenance medications as listed above.  Follow up plan: No follow-ups on file.  Educational handout given for cellulitis  Terald Sleeper PA-C [place] [address]  [place], [place] [address] [phone]   11/25/2018, 10:44 AM

## 2018-11-25 NOTE — Patient Instructions (Signed)

## 2018-11-25 NOTE — Addendum Note (Signed)
Addended byFaylene Million C on: 11/25/2018 11:05 AM   Modules accepted: Orders

## 2018-11-30 ENCOUNTER — Other Ambulatory Visit: Payer: Self-pay

## 2018-11-30 ENCOUNTER — Ambulatory Visit (INDEPENDENT_AMBULATORY_CARE_PROVIDER_SITE_OTHER): Payer: Medicare HMO | Admitting: *Deleted

## 2018-11-30 ENCOUNTER — Encounter: Payer: Self-pay | Admitting: *Deleted

## 2018-11-30 DIAGNOSIS — Z0000 Encounter for general adult medical examination without abnormal findings: Secondary | ICD-10-CM | POA: Diagnosis not present

## 2018-11-30 NOTE — Patient Instructions (Addendum)
  Cynthia Leon , Thank you for taking time to talk with me for your Medicare Wellness Visit. I appreciate your ongoing commitment to your health goals. Please review the following plan we discussed and let me know if I can assist you in the future.   Follow-up Plan . Follow-up with [name], FNP as planned . Schedule eye exam when offices reopen. . Consider getting Shingrix vaccine at next office visit.     These are the goals we discussed: Goals    . Exercise 150 min/wk Moderate Activity     Walking is a great option.            This is a list of the screening recommended for you and due dates:  Health Maintenance  Topic Date Due  . Complete foot exam   07/18/1960  . Eye exam for diabetics  07/18/1960  . Urine Protein Check  02/17/2018  . Flu Shot  02/27/2019  . Mammogram  03/27/2019  . Hemoglobin A1C  04/01/2019  . Colon Cancer Screening  12/12/2025  . Tetanus Vaccine  11/24/2028  . DEXA scan (bone density measurement)  Completed  .  Hepatitis C: One time screening is recommended by Center for Disease Control  (CDC) for  adults born from [birth year] through 1966.   Completed  . Pneumonia vaccines  Completed

## 2018-11-30 NOTE — Progress Notes (Signed)
Cynthia Leon VISIT  11/30/2018  Telephone Visit Disclaimer This Medicare AWV was conducted by telephone due to national recommendations for restrictions regarding the COVID-19 Pandemic (e.g. social distancing).  I verified, using two identifiers, that I am speaking with Cynthia Leon or their authorized healthcare agent. I discussed the limitations, risks, security, and privacy concerns of performing an evaluation and management service by telephone and the potential availability of an in-person appointment in the future. The patient expressed understanding and agreed to proceed.   Subjective:  Cynthia Leon is a 68 y.o. female patient of Hawks, Theador Hawthorne, FNP who had a Medicare Annual Wellness Visit today via telephone. Cynthia Leon is retired- she worked at [employer] until she fell off of a ladder there and went out of work on disability.  She lives with her husband.   She has 3 biological children, 2 step children, 11 grandchildren, and 8 great grandchildren.. she reports that she is socially active and does interact with friends/family regularly.  One of her daughters lives within walking distance of her, and she sees her most days.  she is minimally physically active and enjoys being outside in her yard.   Patient Care Team: [name], FNP as PCP - General (Family Medicine)  Advanced Directives 11/30/2018 11/26/2017 11/25/2016 12/13/2015  Does Patient Have a Medical Advance Directive? No No No No  Would patient like information on creating a medical advance directive? Yes (MAU/Ambulatory/Procedural Areas - Information given) No - Patient declined Yes (MAU/Ambulatory/Procedural Areas - Information given) -    Hospital Utilization Over the Past 12 Months: # of hospitalizations or ER visits: 0 # of surgeries: 0  Review of Systems    Patient reports that her overall health is better compared to last year because she is eating healthier and feels better.    Review of Systems: Musculoskeletal ROS: positive for - right hip pain  All other systems negative.  Pain Assessment Pain Score: 5      Current Medications & Allergies (verified) Allergies as of 11/30/2018      Reactions   Mobic [meloxicam] Nausea And Vomiting   Asa [aspirin] Nausea And Vomiting      Medication List       Accurate as of Nov 30, 2018  9:26 AM. Always use your most recent med list.        ALPRAZolam 0.5 MG tablet Commonly known as:  XANAX TAKE 1 TABLET BY MOUTH EVERY DAY AT BEDTIME AS NEEDED   cephALEXin 500 MG capsule Commonly known as:  KEFLEX Take 1 capsule (500 mg total) by mouth 4 (four) times daily.   cyclobenzaprine 10 MG tablet Commonly known as:  FLEXERIL Take 1 tablet (10 mg total) by mouth 3 (three) times daily as needed for muscle spasms.   DULoxetine 60 MG capsule Commonly known as:  Cymbalta Take 1 capsule (60 mg total) by mouth daily.   fluticasone 50 MCG/ACT nasal spray Commonly known as:  FLONASE USE 2 SPRAYS IN EACH NOSTRIL ONCE DAILY   multivitamin with minerals tablet Take 1 tablet by mouth daily.   MYLANTA PO Take by mouth as needed.   Omeprazole-Sodium Bicarbonate 20-1100 MG Caps capsule Commonly known as:  Zegerid OTC Take 1 capsule by mouth daily before breakfast.   rosuvastatin 20 MG tablet Commonly known as:  CRESTOR TAKE 1 TABLET (20 MG TOTAL) DAILY BY MOUTH.   topiramate 50 MG tablet Commonly known as:  TOPAMAX TAKE 1 TABLET BY MOUTH TWICE  A DAY   Vitamin D (Ergocalciferol) 1.25 MG (50000 UT) Caps capsule Commonly known as:  DRISDOL TAKE 1 CAPSULE (50,000 UNITS TOTAL) BY MOUTH EVERY 7 (SEVEN) DAYS.       History (reviewed): Past Medical History:  Diagnosis Date  . Abdominal pain   . Allergic rhinitis   . Anxiety   . Constipation   . Fibromyalgia   . GERD (gastroesophageal reflux disease)   . Head ache   . Hyperlipidemia   . Osteopenia   . PUD (peptic ulcer disease)   . Right hip pain   .  Shingles    Past Surgical History:  Procedure Laterality Date  . COLONOSCOPY N/A 12/13/2015   Procedure: COLONOSCOPY;  Surgeon: Rogene Houston, MD;  Location: [facility];  Service: Endoscopy;  Laterality: N/A;  1030  . TOTAL HIP ARTHROPLASTY Right    Family History  Problem Relation Age of Onset  . Breast cancer Mother   . Coronary artery disease Mother   . Cancer Mother        breast  . Emphysema Father   . COPD Father   . Hypertension Father   . Heart disease Father   . Diabetes Sister   . Cancer Sister        throat  . Coronary artery disease Sister   . Diabetes Sister   . Esophageal cancer Sister   . Cancer Brother        brain  . Cancer Brother        bone  . Cancer Brother        lung  . Cancer Brother   . Lupus Brother   . Healthy Daughter   . Healthy Daughter   . Healthy Daughter   . Healthy Daughter    Social History   Socioeconomic History  . Marital status: Married    Spouse name: Not on file  . Number of children: 3  . Years of education: 78  . Highest education level: 11th grade  Occupational History  . Occupation: retired  Scientific laboratory technician  . Financial resource strain: Not hard at all  . Food insecurity:    Worry: Never true    Inability: Never true  . Transportation needs:    Medical: No    Non-medical: No  Tobacco Use  . Smoking status: Passive Smoke Exposure - Never Smoker  . Smokeless tobacco: Never Used  Substance and Sexual Activity  . Alcohol use: No  . Drug use: No  . Sexual activity: Yes  Lifestyle  . Physical activity:    Days per week: 0 days    Minutes per session: 0 min  . Stress: Only a little  Relationships  . Social connections:    Talks on phone: More than three times a week    Gets together: More than three times a week    Attends religious service: Never    Active member of club or organization: No    Attends meetings of clubs or organizations: Never    Relationship status: Married  Other Topics Concern  . Not  on file  Social History Narrative  . Not on file    Activities of Daily Living In your present state of health, do you have any difficulty performing the following activities: 11/30/2018  Hearing? N  Vision? N  Difficulty concentrating or making decisions? N  Walking or climbing stairs? N  Dressing or bathing? N  Doing errands, shopping? N  Preparing Food and eating ?  N  Using the Toilet? N  In the past six months, have you accidently leaked urine? N  Do you have problems with loss of bowel control? N  Managing your Medications? N  Managing your Finances? N  Housekeeping or managing your Housekeeping? N  Some recent data might be hidden    Patient Literacy    Exercise Current Exercise Habits: The patient does not participate in regular exercise at present  Plans to start walking more since weather is improving.  Diet Patient reports consuming 3 meals a day and 0 snack(s) a day Patient reports that her primary diet is: Low fat, Diabetic Patient reports that she does have regular access to food.  Patient has seen a dietician for education on diabetes.  She has been reducing fried foods, sweets and beverages with sugar.   Depression Screen PHQ 2/9 Scores 11/30/2018 11/25/2018 09/29/2018 09/14/2018 06/29/2018 05/08/2018 12/03/2017  PHQ - 2 Score 0 0 0 0 0 0 0  PHQ- 9 Score - 0 - - - - -     Fall Risk Fall Risk  11/30/2018 09/29/2018 09/14/2018 06/29/2018 12/03/2017  Falls in the past year? 0 0 0 0 No  Comment - - - - -  Number falls in past yr: - - - - -  Injury with Fall? - - - - -  Risk Factor Category  - - - - -  Risk for fall due to : - - - - -  Follow up - - - - -     Objective:  Cynthia Leon seemed alert and oriented and she participated appropriately during our telephone visit.  Blood Pressure Weight BMI  BP Readings from Last 3 Encounters:  11/25/18 131/84  09/29/18 122/80  09/14/18 122/76   Wt Readings from Last 3 Encounters:  11/25/18 214 lb (97.1 kg)   09/29/18 218 lb 3.2 oz (99 kg)  09/14/18 215 lb 3.2 oz (97.6 kg)   BMI Readings from Last 1 Encounters:  11/25/18 34.54 kg/m    *Unable to obtain current vital signs, weight, and BMI due to telephone visit type  Hearing/Vision  . Cynthia Leon did not seem to have difficulty with hearing/understanding during the telephone conversation . Reports that she has not had a formal eye exam by an eye care professional within the past year, but plans to schedule as soon as COVID19 restrictions are lifted. . Reports that she has not had a formal hearing evaluation within the past year *Unable to fully assess hearing and vision during telephone visit type  Cognitive Function: 6CIT Screen 11/30/2018  What Year? 0 points  What month? 0 points  What time? 0 points  Count back from 20 0 points  Months in reverse 0 points  Repeat phrase 4 points  Total Score 4    Normal Cognitive Function Screening: Yes (Normal:0-7, Significant for Dysfunction: >8)  Immunization & Health Maintenance Record Immunization History  Administered Date(s) Administered  . Pneumococcal Conjugate-13 11/09/2015  . Pneumococcal Polysaccharide-23 11/25/2016  . Td 11/02/2008, 11/25/2018    Health Maintenance  Topic Date Due  . FOOT EXAM  [birth year]18/1961  . OPHTHALMOLOGY EXAM  07[birth year]18/1961  . URINE MICROALBUMIN  02/17/2018  . INFLUENZA VACCINE  02/27/2019  . MAMMOGRAM  03/27/2019  . HEMOGLOBIN A1C  04/01/2019  . COLONOSCOPY  12/12/2025  . TETANUS/TDAP  11/24/2028  . DEXA SCAN  Completed  . Hepatitis C Screening  Completed  . PNA vac Low Risk Adult  Completed  Assessment  This is a routine wellness examination for Cynthia Leon.  Health Maintenance: Due or Overdue Health Maintenance Due  Topic Date Due  . FOOT EXAM  [birth year]18/1961  . OPHTHALMOLOGY EXAM  07/18/1960  . URINE MICROALBUMIN  02/17/2018    Recommend Foot exam and urine microalbumin at next visit with Evelina Dun, FNP.   Cynthia Leon  does not need a referral for Community Assistance: Care Management:   no Social Work:    no Prescription Assistance:  no Nutrition/Diabetes Education:  no   Plan:  Personalized Goals Goals Addressed            This Visit's Progress   . Exercise 150 min/wk Moderate Activity   Not on track    Walking is a great option.      Personalized Health Maintenance & Screening Recommendations  Advanced directives: has NO advanced directive  - add't info requested. Referral to SW: no  Lung Cancer Screening Recommended: no (Low Dose CT Chest recommended if Age 27-80 years, 30 pack-year currently smoking OR have quit w/in past 15 years) Hepatitis C Screening recommended: completed 09/08/2015   Advanced Directives: Written information was prepared per patient's request.  Mailed to patient  Referrals & Orders n/a  Follow-up Plan . Follow-up with [name], FNP as planned . Schedule eye exam when offices reopen. . Consider getting Shingrix vaccine at next office visit.    I have personally reviewed and noted the following in the patient's chart:   . Medical and social history . Use of alcohol, tobacco or illicit drugs  . Current medications and supplements . Functional ability and status . Nutritional status . Physical activity . Advanced directives . List of other physicians . Hospitalizations, surgeries, and ER visits in previous 12 months . Vitals . Screenings to include cognitive, depression, and falls . Referrals and appointments  In addition, I have reviewed and discussed with Cynthia Leon certain preventive protocols, quality metrics, and best practice recommendations. A written personalized care plan for preventive services as well as general preventive health recommendations is available and can be mailed to the patient at her request.      Nolberto Hanlon, RN  11/30/2018

## 2018-12-14 ENCOUNTER — Other Ambulatory Visit: Payer: Self-pay | Admitting: Family

## 2018-12-14 DIAGNOSIS — M797 Fibromyalgia: Secondary | ICD-10-CM

## 2018-12-20 ENCOUNTER — Other Ambulatory Visit: Payer: Self-pay | Admitting: Family

## 2018-12-20 DIAGNOSIS — F411 Generalized anxiety disorder: Secondary | ICD-10-CM

## 2018-12-20 DIAGNOSIS — M797 Fibromyalgia: Secondary | ICD-10-CM

## 2018-12-22 ENCOUNTER — Other Ambulatory Visit: Payer: Self-pay | Admitting: Family

## 2018-12-22 DIAGNOSIS — M797 Fibromyalgia: Secondary | ICD-10-CM

## 2018-12-22 DIAGNOSIS — F411 Generalized anxiety disorder: Secondary | ICD-10-CM

## 2018-12-22 NOTE — Telephone Encounter (Signed)
OV 09/29/18 rtc 6 mos NOV 04/01/19

## 2018-12-24 ENCOUNTER — Telehealth: Payer: Self-pay | Admitting: Family

## 2018-12-24 NOTE — Telephone Encounter (Signed)
Left message, refill on cymbalta was done and may need you to schedule a recheck with your provider soon.

## 2019-01-19 ENCOUNTER — Encounter: Payer: Self-pay | Admitting: Nutrition

## 2019-01-19 ENCOUNTER — Encounter: Payer: Medicare Other | Attending: Family | Admitting: Nutrition

## 2019-01-19 ENCOUNTER — Other Ambulatory Visit: Payer: Self-pay

## 2019-01-19 ENCOUNTER — Telehealth: Payer: Self-pay | Admitting: Nutrition

## 2019-01-19 DIAGNOSIS — R739 Hyperglycemia, unspecified: Secondary | ICD-10-CM | POA: Insufficient documentation

## 2019-01-19 DIAGNOSIS — E669 Obesity, unspecified: Secondary | ICD-10-CM | POA: Insufficient documentation

## 2019-01-19 NOTE — Patient Instructions (Signed)
Plan:  Goals Follow Plate Method Eat 2-3 carb choices per meal Increase fresh fruits and vegetables. Drink only water and no more half and half tea Lose 1-2 lbs per week

## 2019-01-19 NOTE — Telephone Encounter (Signed)
vm to call back to complete phone visit.

## 2019-01-19 NOTE — Progress Notes (Signed)
Telephone Visit for PreDM due to Covid 19 She lives with husband. A1C 6%. Just diagnosed with PreDM.  Changes made since last visit.  Follow My Plate Eat more fresh fruits and vegetables-done Drink only water- done Cut out snacks.-done Eat meals on time B) 6-8 L)12-2 and dinner 5-7 pm-done Walk 30 minutes- walking 3 times per week. Don't eat past 7 pm.-working on it.  Cut out processed foods-done.   B) high fiber cereal witih milk, fruit, coffee L) Crackers, cheese, apple, water,  D) 1/2 PB sandwich, chips, apple,  Water  Diet is insuffient and she is being too restrictive in certain foods. Her daughter is trying to help her. She is working on making changes.  Lab Results  Component Value Date   HGBA1C 6.0 (H) 09/29/2018   CMP Latest Ref Rng & Units 09/29/2018 06/29/2018 12/03/2017  Glucose 65 - 99 mg/dL 129(H) 89 91  BUN 8 - 27 mg/dL 6(L) 7(L) 7(L)  Creatinine 0.57 - 1.00 mg/dL 0.68 0.82 0.81  Sodium 134 - 144 mmol/L 137 140 139  Potassium 3.5 - 5.2 mmol/L 4.2 4.4 4.4  Chloride 96 - 106 mmol/L 100 103 102  CO2 20 - 29 mmol/L 24 22 21   Calcium 8.7 - 10.3 mg/dL 9.7 9.9 9.8  Total Protein 6.0 - 8.5 g/dL 6.5 6.5 6.6  Total Bilirubin 0.0 - 1.2 mg/dL 0.3 0.3 0.3  Alkaline Phos 39 - 117 IU/L 72 80 80  AST 0 - 40 IU/L 9 15 6   ALT 0 - 32 IU/L 19 16 13    Wt Readings from Last 3 Encounters:  11/25/18 214 lb (97.1 kg)  09/29/18 218 lb 3.2 oz (99 kg)  09/14/18 215 lb 3.2 oz (97.6 kg)   Ht Readings from Last 3 Encounters:  11/25/18 5\' 6"  (1.676 m)  09/29/18 5\' 6"  (1.676 m)  09/14/18 5\' 6"  (1.676 m)   There is no height or weight on file to calculate BMI. @BMIFA @ Facility age limit for growth percentiles is 20 years. Facility age limit for growth percentiles is 20 years.  Education provided: Nutrition and Diabetes education provided on My Plate, CHO counting, meal planning, portion sizes, timing of meals, avoiding snacks between meals unless having a low blood sugar, target ranges  for A1C and blood sugars, signs/symptoms and treatment of hyper/hypoglycemia, monitoring blood sugars, taking medications as prescribed, benefits of exercising 30 minutes per day and prevention of complications of DM.    Plan:  Goals Follow Plate Method Eat 2-3 carb choices per meal Increase fresh fruits and vegetables. Drink only water and no more half and half tea Lose 1-2 lbs per week   Will follow up in 2-3 months

## 2019-01-20 ENCOUNTER — Other Ambulatory Visit: Payer: Self-pay | Admitting: Family

## 2019-01-20 NOTE — Telephone Encounter (Signed)
09/29/18 OV rtc 6 mos

## 2019-01-25 ENCOUNTER — Other Ambulatory Visit: Payer: Self-pay | Admitting: Family

## 2019-01-25 DIAGNOSIS — F411 Generalized anxiety disorder: Secondary | ICD-10-CM

## 2019-01-31 ENCOUNTER — Other Ambulatory Visit: Payer: Self-pay | Admitting: Family

## 2019-02-03 DIAGNOSIS — H259 Unspecified age-related cataract: Secondary | ICD-10-CM | POA: Diagnosis not present

## 2019-02-03 DIAGNOSIS — H524 Presbyopia: Secondary | ICD-10-CM | POA: Diagnosis not present

## 2019-02-03 DIAGNOSIS — Z135 Encounter for screening for eye and ear disorders: Secondary | ICD-10-CM | POA: Diagnosis not present

## 2019-02-03 DIAGNOSIS — Z0100 Encounter for examination of eyes and vision without abnormal findings: Secondary | ICD-10-CM | POA: Diagnosis not present

## 2019-02-03 DIAGNOSIS — E7800 Pure hypercholesterolemia, unspecified: Secondary | ICD-10-CM | POA: Diagnosis not present

## 2019-03-15 ENCOUNTER — Other Ambulatory Visit: Payer: Self-pay | Admitting: Family

## 2019-03-15 DIAGNOSIS — E785 Hyperlipidemia, unspecified: Secondary | ICD-10-CM

## 2019-03-18 ENCOUNTER — Other Ambulatory Visit: Payer: Self-pay | Admitting: Family

## 2019-03-18 DIAGNOSIS — M797 Fibromyalgia: Secondary | ICD-10-CM

## 2019-03-18 DIAGNOSIS — F411 Generalized anxiety disorder: Secondary | ICD-10-CM

## 2019-03-31 ENCOUNTER — Other Ambulatory Visit: Payer: Self-pay

## 2019-04-01 ENCOUNTER — Ambulatory Visit (INDEPENDENT_AMBULATORY_CARE_PROVIDER_SITE_OTHER): Payer: Medicare HMO | Admitting: Family

## 2019-04-01 ENCOUNTER — Encounter: Payer: Self-pay | Admitting: Family

## 2019-04-01 VITALS — BP 113/73 | HR 85 | Temp 97.5°F | Ht 66.0 in | Wt 218.6 lb

## 2019-04-01 DIAGNOSIS — F1320 Sedative, hypnotic or anxiolytic dependence, uncomplicated: Secondary | ICD-10-CM | POA: Diagnosis not present

## 2019-04-01 DIAGNOSIS — Z79899 Other long term (current) drug therapy: Secondary | ICD-10-CM | POA: Diagnosis not present

## 2019-04-01 DIAGNOSIS — K5900 Constipation, unspecified: Secondary | ICD-10-CM | POA: Diagnosis not present

## 2019-04-01 DIAGNOSIS — E559 Vitamin D deficiency, unspecified: Secondary | ICD-10-CM | POA: Diagnosis not present

## 2019-04-01 DIAGNOSIS — R69 Illness, unspecified: Secondary | ICD-10-CM | POA: Diagnosis not present

## 2019-04-01 DIAGNOSIS — K219 Gastro-esophageal reflux disease without esophagitis: Secondary | ICD-10-CM | POA: Diagnosis not present

## 2019-04-01 DIAGNOSIS — E785 Hyperlipidemia, unspecified: Secondary | ICD-10-CM

## 2019-04-01 DIAGNOSIS — G43009 Migraine without aura, not intractable, without status migrainosus: Secondary | ICD-10-CM

## 2019-04-01 DIAGNOSIS — M150 Primary generalized (osteo)arthritis: Secondary | ICD-10-CM

## 2019-04-01 DIAGNOSIS — E1169 Type 2 diabetes mellitus with other specified complication: Secondary | ICD-10-CM | POA: Diagnosis not present

## 2019-04-01 DIAGNOSIS — M159 Polyosteoarthritis, unspecified: Secondary | ICD-10-CM

## 2019-04-01 DIAGNOSIS — M797 Fibromyalgia: Secondary | ICD-10-CM

## 2019-04-01 DIAGNOSIS — F411 Generalized anxiety disorder: Secondary | ICD-10-CM

## 2019-04-01 MED ORDER — ALPRAZOLAM 0.5 MG PO TABS: 0.5000 mg | ORAL_TABLET | Freq: Every evening | ORAL | 2 refills | Status: DC | PRN

## 2019-04-01 NOTE — Addendum Note (Signed)
Addended by: Evelina Dun A on: 04/01/2019 09:12 AM   Modules accepted: Orders

## 2019-04-01 NOTE — Patient Instructions (Signed)
Health Maintenance After Age 69 After age 69, you are at a higher risk for certain long-term diseases and infections as well as injuries from falls. Falls are a major cause of broken bones and head injuries in people who are older than age 69. Getting regular preventive care can help to keep you healthy and well. Preventive care includes getting regular testing and making lifestyle changes as recommended by your health care provider. Talk with your health care provider about:  Which screenings and tests you should have. A screening is a test that checks for a disease when you have no symptoms.  A diet and exercise plan that is right for you. What should I know about screenings and tests to prevent falls? Screening and testing are the best ways to find a health problem early. Early diagnosis and treatment give you the best chance of managing medical conditions that are common after age 69. Certain conditions and lifestyle choices may make you more likely to have a fall. Your health care provider may recommend:  Regular vision checks. Poor vision and conditions such as cataracts can make you more likely to have a fall. If you wear glasses, make sure to get your prescription updated if your vision changes.  Medicine review. Work with your health care provider to regularly review all of the medicines you are taking, including over-the-counter medicines. Ask your health care provider about any side effects that may make you more likely to have a fall. Tell your health care provider if any medicines that you take make you feel dizzy or sleepy.  Osteoporosis screening. Osteoporosis is a condition that causes the bones to get weaker. This can make the bones weak and cause them to break more easily.  Blood pressure screening. Blood pressure changes and medicines to control blood pressure can make you feel dizzy.  Strength and balance checks. Your health care provider may recommend certain tests to check your  strength and balance while standing, walking, or changing positions.  Foot health exam. Foot pain and numbness, as well as not wearing proper footwear, can make you more likely to have a fall.  Depression screening. You may be more likely to have a fall if you have a fear of falling, feel emotionally low, or feel unable to do activities that you used to do.  Alcohol use screening. Using too much alcohol can affect your balance and may make you more likely to have a fall. What actions can I take to lower my risk of falls? General instructions  Talk with your health care provider about your risks for falling. Tell your health care provider if: ? You fall. Be sure to tell your health care provider about all falls, even ones that seem minor. ? You feel dizzy, sleepy, or off-balance.  Take over-the-counter and prescription medicines only as told by your health care provider. These include any supplements.  Eat a healthy diet and maintain a healthy weight. A healthy diet includes low-fat dairy products, low-fat (lean) meats, and fiber from whole grains, beans, and lots of fruits and vegetables. Home safety  Remove any tripping hazards, such as rugs, cords, and clutter.  Install safety equipment such as grab bars in bathrooms and safety rails on stairs.  Keep rooms and walkways well-lit. Activity   Follow a regular exercise program to stay fit. This will help you maintain your balance. Ask your health care provider what types of exercise are appropriate for you.  If you need a cane or   walker, use it as recommended by your health care provider.  Wear supportive shoes that have nonskid soles. Lifestyle  Do not drink alcohol if your health care provider tells you not to drink.  If you drink alcohol, limit how much you have: ? 0-1 drink a day for women. ? 0-2 drinks a day for men.  Be aware of how much alcohol is in your drink. In the [place] one drink equals one typical bottle of beer (12  oz), one-half glass of wine (5 oz), or one shot of hard liquor (1 oz).  Do not use any products that contain nicotine or tobacco, such as cigarettes and e-cigarettes. If you need help quitting, ask your health care provider. Summary  Having a healthy lifestyle and getting preventive care can help to protect your health and wellness after age 69.  Screening and testing are the best way to find a health problem early and help you avoid having a fall. Early diagnosis and treatment give you the best chance for managing medical conditions that are more common for people who are older than age 69.  Falls are a major cause of broken bones and head injuries in people who are older than age 69. Take precautions to prevent a fall at home.  Work with your health care provider to learn what changes you can make to improve your health and wellness and to prevent falls. This information is not intended to replace advice given to you by your health care provider. Make sure you discuss any questions you have with your health care provider. Document Released: 05/28/2017 Document Revised: 11/05/2018 Document Reviewed: 05/28/2017 Elsevier Patient Education  2020 [employer]  

## 2019-04-01 NOTE — Progress Notes (Signed)
Subjective:    Patient ID: Cynthia Leon, female    DOB: [birth year], 68 y.o.   MRN: [id number]  Chief Complaint  Patient presents with  . Medical Management of Chronic Issues   Pt presents to the office today for chronic follow up.  Migraine  This is a chronic problem. The current episode started more than 1 year ago. The problem occurs monthly. The problem has been waxing and waning. The pain is moderate. Pertinent negatives include no blurred vision or coughing. She has tried beta blockers for the symptoms. The treatment provided moderate relief. Her past medical history is significant for migraine headaches and obesity.  Gastroesophageal Reflux She complains of belching and heartburn. She reports no coughing. This is a chronic problem. The current episode started more than 1 year ago. The problem occurs occasionally. The problem has been waxing and waning. Risk factors include obesity. She has tried a PPI for the symptoms. The treatment provided moderate relief.  Diabetes She presents for her follow-up diabetic visit. She has type 2 diabetes mellitus. Her disease course has been stable. Hypoglycemia symptoms include nervousness/anxiousness. Pertinent negatives for diabetes include no blurred vision and no foot paresthesias. There are no hypoglycemic complications. Symptoms are stable. Pertinent negatives for diabetic complications include no CVA, heart disease, nephropathy or peripheral neuropathy. Risk factors for coronary artery disease include dyslipidemia, diabetes mellitus, hypertension, sedentary lifestyle and post-menopausal. She is following a generally healthy diet. Eye exam is current.  Arthritis Presents for follow-up visit. She complains of pain and stiffness. The symptoms have been stable. Affected locations include the right knee, left knee, right MCP and left MCP. Her pain is at a severity of 8/10.  Hyperlipidemia This is a chronic problem. The current episode started  more than 1 year ago. The problem is controlled. Recent lipid tests were reviewed and are normal. Exacerbating diseases include obesity. Current antihyperlipidemic treatment includes statins. The current treatment provides moderate improvement of lipids. Risk factors for coronary artery disease include dyslipidemia, diabetes mellitus, hypertension, a sedentary lifestyle and post-menopausal.  Anxiety Presents for follow-up visit. Symptoms include decreased concentration, depressed mood, excessive worry, irritability, nervous/anxious behavior and restlessness. Symptoms occur occasionally. The severity of symptoms is moderate.    Fibromyalgia Pt takes Cymbalta daily and tries to be as active as possible. Stable.    Review of Systems  Constitutional: Positive for irritability.  Eyes: Negative for blurred vision.  Respiratory: Negative for cough.   Gastrointestinal: Positive for heartburn.  Musculoskeletal: Positive for arthritis and stiffness.  Psychiatric/Behavioral: Positive for decreased concentration. The patient is nervous/anxious.   All other systems reviewed and are negative.      Objective:   Physical Exam Vitals signs reviewed.  Constitutional:      General: She is not in acute distress.    Appearance: She is well-developed. She is obese.  HENT:     Head: Normocephalic and atraumatic.     Right Ear: Tympanic membrane normal.     Left Ear: Tympanic membrane normal.  Eyes:     Pupils: Pupils are equal, round, and reactive to light.  Neck:     Musculoskeletal: Normal range of motion and neck supple.     Thyroid: No thyromegaly.  Cardiovascular:     Rate and Rhythm: Normal rate and regular rhythm.     Heart sounds: Normal heart sounds. No murmur.  Pulmonary:     Effort: Pulmonary effort is normal. No respiratory distress.     Breath sounds: Normal breath sounds.  No wheezing.  Abdominal:     General: Bowel sounds are normal. There is no distension.     Palpations: Abdomen  is soft.     Tenderness: There is no abdominal tenderness.  Musculoskeletal: Normal range of motion.        General: No tenderness.  Skin:    General: Skin is warm and dry.  Neurological:     Mental Status: She is alert and oriented to person, place, and time.     Cranial Nerves: No cranial nerve deficit.     Deep Tendon Reflexes: Reflexes are normal and symmetric.  Psychiatric:        Behavior: Behavior normal.        Thought Content: Thought content normal.        Judgment: Judgment normal.      Diabetic Foot Exam - Simple   Simple Foot Form Diabetic Foot exam was performed with the following findings: Yes 04/01/2019  9:01 AM  Visual Inspection No deformities, no ulcerations, no other skin breakdown bilaterally: Yes Sensation Testing Intact to touch and monofilament testing bilaterally: Yes Pulse Check Posterior Tibialis and Dorsalis pulse intact bilaterally: Yes Comments        BP 113/73   Pulse 85   Temp (!) 97.5 F (36.4 C) (Oral)   Ht _0  (1.676 m)   Wt 218 lb 9.6 oz (99.2 kg)   BMI 35.28 kg/m   Assessment & Plan:  Cynthia Leon comes in today with chief complaint of Medical Management of Chronic Issues   Diagnosis and orders addressed:  1. Gastroesophageal reflux disease, esophagitis presence not specified - CMP14+EGFR - CBC with Differential/Platelet  2. Type 2 diabetes mellitus with other specified complication, without long-term current use of insulin (HCC) - Bayer DCA Hb A1c Waived - CMP14+EGFR - CBC with Differential/Platelet - Microalbumin / creatinine urine ratio  3. Primary osteoarthritis involving multiple joints - CMP14+EGFR - CBC with Differential/Platelet  4. Benzodiazepine dependence (HCC) - CMP14+EGFR - CBC with Differential/Platelet  5. Constipation, unspecified constipation type - CMP14+EGFR - CBC with Differential/Platelet  6. Controlled substance agreement signed - CMP14+EGFR - CBC with Differential/Platelet  7.  GAD (generalized anxiety disorder) - CMP14+EGFR - CBC with Differential/Platelet  8. Hyperlipidemia, unspecified hyperlipidemia type - CMP14+EGFR - CBC with Differential/Platelet  9. Morbid obesity ([place]) - CMP14+EGFR - CBC with Differential/Platelet  10. Vitamin D deficiency - CMP14+EGFR - CBC with Differential/Platelet - VITAMIN D 25 Hydroxy (Vit-D Deficiency, Fractures)  11. Migraine without aura and without status migrainosus, not intractable - CMP14+EGFR - CBC with Differential/Platelet  12. Fibromyalgia - CMP14+EGFR - CBC with Differential/Platelet   Labs pending Health Maintenance reviewed Diet and exercise encouraged  Follow up plan: 6 months    Evelina Dun, FNP

## 2019-04-02 LAB — MICROALBUMIN / CREATININE URINE RATIO
Creatinine, Urine: 26.1 mg/dL
Microalb/Creat Ratio: 14 mg/g creat (ref 0–29)
Microalbumin, Urine: 3.6 ug/mL

## 2019-04-06 ENCOUNTER — Other Ambulatory Visit: Payer: Self-pay | Admitting: Family

## 2019-04-14 ENCOUNTER — Other Ambulatory Visit: Payer: Self-pay | Admitting: Family

## 2019-04-15 DIAGNOSIS — M9903 Segmental and somatic dysfunction of lumbar region: Secondary | ICD-10-CM | POA: Diagnosis not present

## 2019-04-15 DIAGNOSIS — M9904 Segmental and somatic dysfunction of sacral region: Secondary | ICD-10-CM | POA: Diagnosis not present

## 2019-04-15 DIAGNOSIS — M9905 Segmental and somatic dysfunction of pelvic region: Secondary | ICD-10-CM | POA: Diagnosis not present

## 2019-04-15 DIAGNOSIS — M5137 Other intervertebral disc degeneration, lumbosacral region: Secondary | ICD-10-CM | POA: Diagnosis not present

## 2019-04-19 DIAGNOSIS — M9903 Segmental and somatic dysfunction of lumbar region: Secondary | ICD-10-CM | POA: Diagnosis not present

## 2019-04-19 DIAGNOSIS — M9905 Segmental and somatic dysfunction of pelvic region: Secondary | ICD-10-CM | POA: Diagnosis not present

## 2019-04-19 DIAGNOSIS — M5137 Other intervertebral disc degeneration, lumbosacral region: Secondary | ICD-10-CM | POA: Diagnosis not present

## 2019-04-19 DIAGNOSIS — M9904 Segmental and somatic dysfunction of sacral region: Secondary | ICD-10-CM | POA: Diagnosis not present

## 2019-04-21 ENCOUNTER — Ambulatory Visit: Payer: Medicare HMO | Admitting: Nutrition

## 2019-04-21 DIAGNOSIS — M9905 Segmental and somatic dysfunction of pelvic region: Secondary | ICD-10-CM | POA: Diagnosis not present

## 2019-04-21 DIAGNOSIS — M9904 Segmental and somatic dysfunction of sacral region: Secondary | ICD-10-CM | POA: Diagnosis not present

## 2019-04-21 DIAGNOSIS — M5137 Other intervertebral disc degeneration, lumbosacral region: Secondary | ICD-10-CM | POA: Diagnosis not present

## 2019-04-21 DIAGNOSIS — M9903 Segmental and somatic dysfunction of lumbar region: Secondary | ICD-10-CM | POA: Diagnosis not present

## 2019-04-22 DIAGNOSIS — M9905 Segmental and somatic dysfunction of pelvic region: Secondary | ICD-10-CM | POA: Diagnosis not present

## 2019-04-22 DIAGNOSIS — M9904 Segmental and somatic dysfunction of sacral region: Secondary | ICD-10-CM | POA: Diagnosis not present

## 2019-04-22 DIAGNOSIS — M5137 Other intervertebral disc degeneration, lumbosacral region: Secondary | ICD-10-CM | POA: Diagnosis not present

## 2019-04-22 DIAGNOSIS — M9903 Segmental and somatic dysfunction of lumbar region: Secondary | ICD-10-CM | POA: Diagnosis not present

## 2019-04-26 DIAGNOSIS — M9905 Segmental and somatic dysfunction of pelvic region: Secondary | ICD-10-CM | POA: Diagnosis not present

## 2019-04-26 DIAGNOSIS — M9903 Segmental and somatic dysfunction of lumbar region: Secondary | ICD-10-CM | POA: Diagnosis not present

## 2019-04-26 DIAGNOSIS — M5137 Other intervertebral disc degeneration, lumbosacral region: Secondary | ICD-10-CM | POA: Diagnosis not present

## 2019-04-26 DIAGNOSIS — M9904 Segmental and somatic dysfunction of sacral region: Secondary | ICD-10-CM | POA: Diagnosis not present

## 2019-04-28 DIAGNOSIS — M5137 Other intervertebral disc degeneration, lumbosacral region: Secondary | ICD-10-CM | POA: Diagnosis not present

## 2019-04-28 DIAGNOSIS — M9905 Segmental and somatic dysfunction of pelvic region: Secondary | ICD-10-CM | POA: Diagnosis not present

## 2019-04-28 DIAGNOSIS — M9904 Segmental and somatic dysfunction of sacral region: Secondary | ICD-10-CM | POA: Diagnosis not present

## 2019-04-28 DIAGNOSIS — M9903 Segmental and somatic dysfunction of lumbar region: Secondary | ICD-10-CM | POA: Diagnosis not present

## 2019-04-29 DIAGNOSIS — M9905 Segmental and somatic dysfunction of pelvic region: Secondary | ICD-10-CM | POA: Diagnosis not present

## 2019-04-29 DIAGNOSIS — M9903 Segmental and somatic dysfunction of lumbar region: Secondary | ICD-10-CM | POA: Diagnosis not present

## 2019-04-29 DIAGNOSIS — M9904 Segmental and somatic dysfunction of sacral region: Secondary | ICD-10-CM | POA: Diagnosis not present

## 2019-04-29 DIAGNOSIS — M5137 Other intervertebral disc degeneration, lumbosacral region: Secondary | ICD-10-CM | POA: Diagnosis not present

## 2019-05-03 ENCOUNTER — Encounter: Payer: Medicare HMO | Attending: Family | Admitting: Nutrition

## 2019-05-03 ENCOUNTER — Encounter: Payer: Self-pay | Admitting: Nutrition

## 2019-05-03 ENCOUNTER — Other Ambulatory Visit: Payer: Self-pay

## 2019-05-03 VITALS — Wt 215.0 lb

## 2019-05-03 DIAGNOSIS — M9904 Segmental and somatic dysfunction of sacral region: Secondary | ICD-10-CM | POA: Diagnosis not present

## 2019-05-03 DIAGNOSIS — R739 Hyperglycemia, unspecified: Secondary | ICD-10-CM

## 2019-05-03 DIAGNOSIS — M5137 Other intervertebral disc degeneration, lumbosacral region: Secondary | ICD-10-CM | POA: Diagnosis not present

## 2019-05-03 DIAGNOSIS — M9903 Segmental and somatic dysfunction of lumbar region: Secondary | ICD-10-CM | POA: Diagnosis not present

## 2019-05-03 DIAGNOSIS — E669 Obesity, unspecified: Secondary | ICD-10-CM

## 2019-05-03 DIAGNOSIS — M9905 Segmental and somatic dysfunction of pelvic region: Secondary | ICD-10-CM | POA: Diagnosis not present

## 2019-05-03 NOTE — Patient Instructions (Signed)
Plan:  Goals Follow Plate Method Eat 2-3 carb choices per meal Increase fresh fruits and vegetables. Drink only water  Lose 3 lbs per month

## 2019-05-03 NOTE — Progress Notes (Signed)
Telephone Visit for PreDM due to Covid 19 Follow up  She lives with husband. A1C 6%. PreDM. Just started Physical Therapy x 4 weeks for problems with her back.  Changes made since last visit. She is working on eating better balanced meals. Not snacking.Avoiding eating late.Drinking water.  Lab Results  Component Value Date   HGBA1C 6.0 (H) 09/29/2018   CMP Latest Ref Rng & Units 09/29/2018 06/29/2018 12/03/2017  Glucose 65 - 99 mg/dL 129(H) 89 91  BUN 8 - 27 mg/dL 6(L) 7(L) 7(L)  Creatinine 0.57 - 1.00 mg/dL 0.68 0.82 0.81  Sodium 134 - 144 mmol/L 137 140 139  Potassium 3.5 - 5.2 mmol/L 4.2 4.4 4.4  Chloride 96 - 106 mmol/L 100 103 102  CO2 20 - 29 mmol/L _0 Calcium 8.7 - 10.3 mg/dL 9.7 9.9 9.8  Total Protein 6.0 - 8.5 g/dL 6.5 6.5 6.6  Total Bilirubin 0.0 - 1.2 mg/dL 0.3 0.3 0.3  Alkaline Phos 39 - 117 IU/L 72 80 80  AST 0 - 40 IU/L _1 ALT 0 - 32 IU/L _2 Wt Readings from Last 3 Encounters:  04/01/19 218 lb 9.6 oz (99.2 kg)  11/25/18 214 lb (97.1 kg)  09/29/18 218 lb 3.2 oz (99 kg)   Ht Readings from Last 3 Encounters:  04/01/19 _3  (1.676 m)  11/25/18 _4  (1.676 m)  09/29/18 _5  (1.676 m)   There is no height or weight on file to calculate BMI. _6 @ Facility age limit for growth percentiles is 20 years. Facility age limit for growth percentiles is 20 years.  Education provided: Nutrition and Diabetes education provided on My Plate, Reviewed Meal planning and benefits of activity and weight loss.   B) Scrambled eggs and Fruit. L)ham, crackers and cheese, water D)Deer roast,  Mac/cheese, water  Has been eating more vegetables and fruit. Can't exercise much due to back issues.  Plan:  Goals Follow Plate Method Eat 2-3 carb choices per meal Increase fresh fruits and vegetables. Drink only water  Lose 3 lbs per month  Follow up in 6 months.

## 2019-05-05 DIAGNOSIS — M9905 Segmental and somatic dysfunction of pelvic region: Secondary | ICD-10-CM | POA: Diagnosis not present

## 2019-05-05 DIAGNOSIS — M9903 Segmental and somatic dysfunction of lumbar region: Secondary | ICD-10-CM | POA: Diagnosis not present

## 2019-05-05 DIAGNOSIS — M5137 Other intervertebral disc degeneration, lumbosacral region: Secondary | ICD-10-CM | POA: Diagnosis not present

## 2019-05-05 DIAGNOSIS — M9904 Segmental and somatic dysfunction of sacral region: Secondary | ICD-10-CM | POA: Diagnosis not present

## 2019-05-06 DIAGNOSIS — M5137 Other intervertebral disc degeneration, lumbosacral region: Secondary | ICD-10-CM | POA: Diagnosis not present

## 2019-05-06 DIAGNOSIS — M9903 Segmental and somatic dysfunction of lumbar region: Secondary | ICD-10-CM | POA: Diagnosis not present

## 2019-05-06 DIAGNOSIS — M9905 Segmental and somatic dysfunction of pelvic region: Secondary | ICD-10-CM | POA: Diagnosis not present

## 2019-05-06 DIAGNOSIS — M9904 Segmental and somatic dysfunction of sacral region: Secondary | ICD-10-CM | POA: Diagnosis not present

## 2019-05-10 DIAGNOSIS — M9904 Segmental and somatic dysfunction of sacral region: Secondary | ICD-10-CM | POA: Diagnosis not present

## 2019-05-10 DIAGNOSIS — M9905 Segmental and somatic dysfunction of pelvic region: Secondary | ICD-10-CM | POA: Diagnosis not present

## 2019-05-10 DIAGNOSIS — M5137 Other intervertebral disc degeneration, lumbosacral region: Secondary | ICD-10-CM | POA: Diagnosis not present

## 2019-05-10 DIAGNOSIS — M9903 Segmental and somatic dysfunction of lumbar region: Secondary | ICD-10-CM | POA: Diagnosis not present

## 2019-05-12 DIAGNOSIS — M9905 Segmental and somatic dysfunction of pelvic region: Secondary | ICD-10-CM | POA: Diagnosis not present

## 2019-05-12 DIAGNOSIS — M9904 Segmental and somatic dysfunction of sacral region: Secondary | ICD-10-CM | POA: Diagnosis not present

## 2019-05-12 DIAGNOSIS — M9903 Segmental and somatic dysfunction of lumbar region: Secondary | ICD-10-CM | POA: Diagnosis not present

## 2019-05-12 DIAGNOSIS — M5137 Other intervertebral disc degeneration, lumbosacral region: Secondary | ICD-10-CM | POA: Diagnosis not present

## 2019-05-13 DIAGNOSIS — M9903 Segmental and somatic dysfunction of lumbar region: Secondary | ICD-10-CM | POA: Diagnosis not present

## 2019-05-13 DIAGNOSIS — M9904 Segmental and somatic dysfunction of sacral region: Secondary | ICD-10-CM | POA: Diagnosis not present

## 2019-05-13 DIAGNOSIS — M5137 Other intervertebral disc degeneration, lumbosacral region: Secondary | ICD-10-CM | POA: Diagnosis not present

## 2019-05-13 DIAGNOSIS — M9905 Segmental and somatic dysfunction of pelvic region: Secondary | ICD-10-CM | POA: Diagnosis not present

## 2019-05-17 DIAGNOSIS — M5137 Other intervertebral disc degeneration, lumbosacral region: Secondary | ICD-10-CM | POA: Diagnosis not present

## 2019-05-17 DIAGNOSIS — M9905 Segmental and somatic dysfunction of pelvic region: Secondary | ICD-10-CM | POA: Diagnosis not present

## 2019-05-17 DIAGNOSIS — M9903 Segmental and somatic dysfunction of lumbar region: Secondary | ICD-10-CM | POA: Diagnosis not present

## 2019-05-17 DIAGNOSIS — M9904 Segmental and somatic dysfunction of sacral region: Secondary | ICD-10-CM | POA: Diagnosis not present

## 2019-05-20 DIAGNOSIS — M9904 Segmental and somatic dysfunction of sacral region: Secondary | ICD-10-CM | POA: Diagnosis not present

## 2019-05-20 DIAGNOSIS — M9903 Segmental and somatic dysfunction of lumbar region: Secondary | ICD-10-CM | POA: Diagnosis not present

## 2019-05-20 DIAGNOSIS — M9905 Segmental and somatic dysfunction of pelvic region: Secondary | ICD-10-CM | POA: Diagnosis not present

## 2019-05-20 DIAGNOSIS — M5137 Other intervertebral disc degeneration, lumbosacral region: Secondary | ICD-10-CM | POA: Diagnosis not present

## 2019-05-24 DIAGNOSIS — M9903 Segmental and somatic dysfunction of lumbar region: Secondary | ICD-10-CM | POA: Diagnosis not present

## 2019-05-24 DIAGNOSIS — M9905 Segmental and somatic dysfunction of pelvic region: Secondary | ICD-10-CM | POA: Diagnosis not present

## 2019-05-24 DIAGNOSIS — M9904 Segmental and somatic dysfunction of sacral region: Secondary | ICD-10-CM | POA: Diagnosis not present

## 2019-05-24 DIAGNOSIS — M5137 Other intervertebral disc degeneration, lumbosacral region: Secondary | ICD-10-CM | POA: Diagnosis not present

## 2019-05-27 DIAGNOSIS — M5137 Other intervertebral disc degeneration, lumbosacral region: Secondary | ICD-10-CM | POA: Diagnosis not present

## 2019-05-27 DIAGNOSIS — M9904 Segmental and somatic dysfunction of sacral region: Secondary | ICD-10-CM | POA: Diagnosis not present

## 2019-05-27 DIAGNOSIS — M9905 Segmental and somatic dysfunction of pelvic region: Secondary | ICD-10-CM | POA: Diagnosis not present

## 2019-05-27 DIAGNOSIS — M9903 Segmental and somatic dysfunction of lumbar region: Secondary | ICD-10-CM | POA: Diagnosis not present

## 2019-06-02 DIAGNOSIS — M9903 Segmental and somatic dysfunction of lumbar region: Secondary | ICD-10-CM | POA: Diagnosis not present

## 2019-06-02 DIAGNOSIS — M9905 Segmental and somatic dysfunction of pelvic region: Secondary | ICD-10-CM | POA: Diagnosis not present

## 2019-06-02 DIAGNOSIS — M9904 Segmental and somatic dysfunction of sacral region: Secondary | ICD-10-CM | POA: Diagnosis not present

## 2019-06-02 DIAGNOSIS — M5137 Other intervertebral disc degeneration, lumbosacral region: Secondary | ICD-10-CM | POA: Diagnosis not present

## 2019-06-07 ENCOUNTER — Other Ambulatory Visit: Payer: Self-pay | Admitting: Family

## 2019-06-07 DIAGNOSIS — E785 Hyperlipidemia, unspecified: Secondary | ICD-10-CM

## 2019-06-09 DIAGNOSIS — M5137 Other intervertebral disc degeneration, lumbosacral region: Secondary | ICD-10-CM | POA: Diagnosis not present

## 2019-06-09 DIAGNOSIS — M9904 Segmental and somatic dysfunction of sacral region: Secondary | ICD-10-CM | POA: Diagnosis not present

## 2019-06-09 DIAGNOSIS — M9903 Segmental and somatic dysfunction of lumbar region: Secondary | ICD-10-CM | POA: Diagnosis not present

## 2019-06-09 DIAGNOSIS — M9905 Segmental and somatic dysfunction of pelvic region: Secondary | ICD-10-CM | POA: Diagnosis not present

## 2019-06-12 ENCOUNTER — Other Ambulatory Visit: Payer: Self-pay | Admitting: Family

## 2019-06-12 DIAGNOSIS — F411 Generalized anxiety disorder: Secondary | ICD-10-CM

## 2019-06-12 DIAGNOSIS — M797 Fibromyalgia: Secondary | ICD-10-CM

## 2019-06-16 DIAGNOSIS — M9904 Segmental and somatic dysfunction of sacral region: Secondary | ICD-10-CM | POA: Diagnosis not present

## 2019-06-16 DIAGNOSIS — M5137 Other intervertebral disc degeneration, lumbosacral region: Secondary | ICD-10-CM | POA: Diagnosis not present

## 2019-06-16 DIAGNOSIS — M9905 Segmental and somatic dysfunction of pelvic region: Secondary | ICD-10-CM | POA: Diagnosis not present

## 2019-06-16 DIAGNOSIS — M9903 Segmental and somatic dysfunction of lumbar region: Secondary | ICD-10-CM | POA: Diagnosis not present

## 2019-06-23 DIAGNOSIS — M5137 Other intervertebral disc degeneration, lumbosacral region: Secondary | ICD-10-CM | POA: Diagnosis not present

## 2019-06-23 DIAGNOSIS — M9903 Segmental and somatic dysfunction of lumbar region: Secondary | ICD-10-CM | POA: Diagnosis not present

## 2019-06-23 DIAGNOSIS — M9905 Segmental and somatic dysfunction of pelvic region: Secondary | ICD-10-CM | POA: Diagnosis not present

## 2019-06-23 DIAGNOSIS — M9904 Segmental and somatic dysfunction of sacral region: Secondary | ICD-10-CM | POA: Diagnosis not present

## 2019-06-28 ENCOUNTER — Other Ambulatory Visit: Payer: Self-pay | Admitting: Family

## 2019-07-05 ENCOUNTER — Telehealth: Payer: Self-pay | Admitting: *Deleted

## 2019-07-05 NOTE — Telephone Encounter (Addendum)
Prior Auth for Cyclobenzaprine 10mg -APPROVED through 10/03/19  Key: [id number] -   PA Case ID: [id number]  Your information has been submitted to [place] [employer] Part D. [employer] will review the request and will issue a decision, typically within 1-3 days from your submission. You can check the updated outcome later by reopening this request.  If [employer] has not responded in 1-3 days or if you have any questions about your ePA request, please contact [place] Medicare Part D at [phone]. If you think there may be a problem with your PA request, use our live chat feature at the bottom right.  Pharmacy notified.

## 2019-07-07 DIAGNOSIS — M9904 Segmental and somatic dysfunction of sacral region: Secondary | ICD-10-CM | POA: Diagnosis not present

## 2019-07-07 DIAGNOSIS — M9905 Segmental and somatic dysfunction of pelvic region: Secondary | ICD-10-CM | POA: Diagnosis not present

## 2019-07-07 DIAGNOSIS — M5137 Other intervertebral disc degeneration, lumbosacral region: Secondary | ICD-10-CM | POA: Diagnosis not present

## 2019-07-07 DIAGNOSIS — M9903 Segmental and somatic dysfunction of lumbar region: Secondary | ICD-10-CM | POA: Diagnosis not present

## 2019-07-15 ENCOUNTER — Other Ambulatory Visit: Payer: Self-pay | Admitting: Family

## 2019-08-04 ENCOUNTER — Other Ambulatory Visit: Payer: Self-pay | Admitting: Family

## 2019-08-04 DIAGNOSIS — F411 Generalized anxiety disorder: Secondary | ICD-10-CM

## 2019-08-31 ENCOUNTER — Other Ambulatory Visit: Payer: Self-pay | Admitting: Family

## 2019-08-31 DIAGNOSIS — E785 Hyperlipidemia, unspecified: Secondary | ICD-10-CM

## 2019-09-07 ENCOUNTER — Other Ambulatory Visit: Payer: Self-pay | Admitting: Family

## 2019-09-07 DIAGNOSIS — M797 Fibromyalgia: Secondary | ICD-10-CM

## 2019-09-07 DIAGNOSIS — F411 Generalized anxiety disorder: Secondary | ICD-10-CM

## 2019-09-15 ENCOUNTER — Other Ambulatory Visit: Payer: Self-pay | Admitting: Family

## 2019-09-30 ENCOUNTER — Encounter: Payer: Self-pay | Admitting: Family

## 2019-09-30 ENCOUNTER — Ambulatory Visit (INDEPENDENT_AMBULATORY_CARE_PROVIDER_SITE_OTHER): Payer: Medicare HMO | Admitting: Family

## 2019-09-30 ENCOUNTER — Other Ambulatory Visit: Payer: Self-pay

## 2019-09-30 VITALS — BP 124/78 | HR 94 | Temp 97.0°F | Ht 66.0 in | Wt 219.0 lb

## 2019-09-30 DIAGNOSIS — F411 Generalized anxiety disorder: Secondary | ICD-10-CM

## 2019-09-30 DIAGNOSIS — E559 Vitamin D deficiency, unspecified: Secondary | ICD-10-CM

## 2019-09-30 DIAGNOSIS — M797 Fibromyalgia: Secondary | ICD-10-CM | POA: Diagnosis not present

## 2019-09-30 DIAGNOSIS — Z79899 Other long term (current) drug therapy: Secondary | ICD-10-CM

## 2019-09-30 DIAGNOSIS — M8949 Other hypertrophic osteoarthropathy, multiple sites: Secondary | ICD-10-CM | POA: Diagnosis not present

## 2019-09-30 DIAGNOSIS — R69 Illness, unspecified: Secondary | ICD-10-CM | POA: Diagnosis not present

## 2019-09-30 DIAGNOSIS — G43009 Migraine without aura, not intractable, without status migrainosus: Secondary | ICD-10-CM | POA: Diagnosis not present

## 2019-09-30 DIAGNOSIS — K5900 Constipation, unspecified: Secondary | ICD-10-CM

## 2019-09-30 DIAGNOSIS — E1169 Type 2 diabetes mellitus with other specified complication: Secondary | ICD-10-CM

## 2019-09-30 DIAGNOSIS — M159 Polyosteoarthritis, unspecified: Secondary | ICD-10-CM

## 2019-09-30 DIAGNOSIS — K219 Gastro-esophageal reflux disease without esophagitis: Secondary | ICD-10-CM

## 2019-09-30 DIAGNOSIS — F1320 Sedative, hypnotic or anxiolytic dependence, uncomplicated: Secondary | ICD-10-CM

## 2019-09-30 LAB — CBC WITH DIFFERENTIAL/PLATELET
Basophils Absolute: 0.1 10*3/uL (ref 0.0–0.2)
Basos: 1 %
EOS (ABSOLUTE): 0.5 10*3/uL — ABNORMAL HIGH (ref 0.0–0.4)
Eos: 6 %
Hematocrit: 41.5 % (ref 34.0–46.6)
Hemoglobin: 14 g/dL (ref 11.1–15.9)
Immature Grans (Abs): 0 10*3/uL (ref 0.0–0.1)
Immature Granulocytes: 0 %
Lymphocytes Absolute: 1.9 10*3/uL (ref 0.7–3.1)
Lymphs: 25 %
MCH: 29.3 pg (ref 26.6–33.0)
MCHC: 33.7 g/dL (ref 31.5–35.7)
MCV: 87 fL (ref 79–97)
Monocytes Absolute: 0.6 10*3/uL (ref 0.1–0.9)
Monocytes: 8 %
Neutrophils Absolute: 4.6 10*3/uL (ref 1.4–7.0)
Neutrophils: 60 %
Platelets: 285 10*3/uL (ref 150–450)
RBC: 4.78 x10E6/uL (ref 3.77–5.28)
RDW: 13.1 % (ref 11.7–15.4)
WBC: 7.6 10*3/uL (ref 3.4–10.8)

## 2019-09-30 LAB — CMP14+EGFR
ALT: 16 IU/L (ref 0–32)
AST: 12 IU/L (ref 0–40)
Albumin/Globulin Ratio: 2 (ref 1.2–2.2)
Albumin: 4.5 g/dL (ref 3.8–4.8)
Alkaline Phosphatase: 80 IU/L (ref 39–117)
BUN/Creatinine Ratio: 8 — ABNORMAL LOW (ref 12–28)
BUN: 6 mg/dL — ABNORMAL LOW (ref 8–27)
Bilirubin Total: 0.4 mg/dL (ref 0.0–1.2)
CO2: 21 mmol/L (ref 20–29)
Calcium: 10 mg/dL (ref 8.7–10.3)
Chloride: 102 mmol/L (ref 96–106)
Creatinine, Ser: 0.78 mg/dL (ref 0.57–1.00)
GFR calc Af Amer: 90 mL/min/{1.73_m2} (ref >59)
GFR calc non Af Amer: 78 mL/min/{1.73_m2} (ref >59)
Globulin, Total: 2.3 g/dL (ref 1.5–4.5)
Glucose: 116 mg/dL — ABNORMAL HIGH (ref 65–99)
Potassium: 4.1 mmol/L (ref 3.5–5.2)
Sodium: 138 mmol/L (ref 134–144)
Total Protein: 6.8 g/dL (ref 6.0–8.5)

## 2019-09-30 LAB — BAYER DCA HB A1C WAIVED: HB A1C (BAYER DCA - WAIVED): 6 % (ref <7.0)

## 2019-09-30 MED ORDER — ALPRAZOLAM 0.5 MG PO TABS: 0.5000 mg | ORAL_TABLET | Freq: Every evening | ORAL | 5 refills | Status: DC | PRN

## 2019-09-30 NOTE — Progress Notes (Signed)
Subjective:    Patient ID: Cynthia Leon, female    DOB: Dec 16, 1950, 69 y.o.   MRN: [id number]  Chief Complaint  Patient presents with  . Medical Management of Chronic Issues  . Shoulder Pain    bilateral shoulder pain x 2 weeks    Pt presents to the office today for chronic follow up.  Shoulder Pain  The pain is present in the right shoulder and left shoulder. This is a new problem. The current episode started 1 to 4 weeks ago. There has been no history of extremity trauma. The problem occurs intermittently. The problem has been waxing and waning. The quality of the pain is described as aching. The pain is at a severity of 8/10. The pain is moderate. Pertinent negatives include no inability to bear weight, joint locking or joint swelling. She has tried rest and acetaminophen for the symptoms. The treatment provided mild relief.  Arthritis Presents for follow-up visit. She complains of pain. The symptoms have been stable. Affected locations include the right MCP, left MCP, left hip and right hip. Her pain is at a severity of 1/10.  Anxiety Presents for follow-up visit. Symptoms include decreased concentration, excessive worry, irritability, panic and restlessness. Symptoms occur most days. The severity of symptoms is moderate.    Gastroesophageal Reflux She complains of belching, heartburn and a hoarse voice. This is a chronic problem. The current episode started more than 1 year ago. The problem occurs occasionally. The problem has been waxing and waning. The symptoms are aggravated by certain foods. She has tried a PPI for the symptoms. The treatment provided moderate relief.  Migraine  This is a chronic problem. The current episode started more than 1 year ago. The problem occurs intermittently. The problem has been resolved (since starting topamax). Associated symptoms include phonophobia and photophobia. Pertinent negatives include no blurred vision or visual change. She has  tried beta blockers for the symptoms. The treatment provided significant relief. Her past medical history is significant for migraine headaches and obesity.  Diabetes She presents for her follow-up diabetic visit. She has type 2 diabetes mellitus. Her disease course has been stable. There are no hypoglycemic associated symptoms. Pertinent negatives for diabetes include no blurred vision, no foot paresthesias and no visual change. There are no hypoglycemic complications. Symptoms are stable. Risk factors for coronary artery disease include diabetes mellitus, dyslipidemia, sedentary lifestyle and post-menopausal. She is following a generally healthy diet. (Does not check BS ) Eye exam is current.  Hyperlipidemia This is a chronic problem. The current episode started more than 1 year ago. The problem is controlled. Recent lipid tests were reviewed and are normal. Exacerbating diseases include obesity. Current antihyperlipidemic treatment includes statins. The current treatment provides moderate improvement of lipids. Risk factors for coronary artery disease include diabetes mellitus, dyslipidemia, hypertension, a sedentary lifestyle and post-menopausal.  Constipation This is a chronic problem. The current episode started more than 1 year ago. She has tried laxatives for the symptoms. The treatment provided moderate relief.      Review of Systems  Constitutional: Positive for irritability.  HENT: Positive for hoarse voice.   Eyes: Positive for photophobia. Negative for blurred vision.  Gastrointestinal: Positive for constipation and heartburn.  Musculoskeletal: Positive for arthritis.  Psychiatric/Behavioral: Positive for decreased concentration.  All other systems reviewed and are negative.      Objective:   Physical Exam Vitals reviewed.  Constitutional:      General: She is not in acute distress.  Appearance: She is well-developed. She is obese.  HENT:     Head: Normocephalic and  atraumatic.     Right Ear: Tympanic membrane normal.     Left Ear: Tympanic membrane normal.  Eyes:     Pupils: Pupils are equal, round, and reactive to light.  Neck:     Thyroid: No thyromegaly.  Cardiovascular:     Rate and Rhythm: Normal rate and regular rhythm.     Heart sounds: Normal heart sounds. No murmur.  Pulmonary:     Effort: Pulmonary effort is normal. No respiratory distress.     Breath sounds: Normal breath sounds. No wheezing.  Abdominal:     General: Bowel sounds are normal. There is no distension.     Palpations: Abdomen is soft.     Tenderness: There is no abdominal tenderness.  Musculoskeletal:        General: No tenderness. Normal range of motion.     Cervical back: Normal range of motion and neck supple.  Skin:    General: Skin is warm and dry.  Neurological:     Mental Status: She is alert and oriented to person, place, and time.     Cranial Nerves: No cranial nerve deficit.     Deep Tendon Reflexes: Reflexes are normal and symmetric.  Psychiatric:        Behavior: Behavior normal.        Thought Content: Thought content normal.        Judgment: Judgment normal.    Diabetic Foot Exam - Simple   Simple Foot Form Diabetic Foot exam was performed with the following findings: Yes 09/30/2019 10:22 AM  Visual Inspection No deformities, no ulcerations, no other skin breakdown bilaterally: Yes Sensation Testing Intact to touch and monofilament testing bilaterally: Yes Pulse Check Posterior Tibialis and Dorsalis pulse intact bilaterally: Yes Comments         BP 124/78   Pulse 94   Temp (!) 97 F (36.1 C) (Temporal)   Ht '5\' 6"'  (1.676 m)   Wt 219 lb (99.3 kg)   SpO2 97%   BMI 35.35 kg/m   Assessment & Plan:  CORDIA MIKLOS comes in today with chief complaint of Medical Management of Chronic Issues and Shoulder Pain (bilateral shoulder pain x 2 weeks )   Diagnosis and orders addressed:  1. Type 2 diabetes mellitus with other specified  complication, without long-term current use of insulin (HCC) - Bayer DCA Hb A1c Waived - CMP14+EGFR - CBC with Differential/Platelet  2. GAD (generalized anxiety disorder) - ALPRAZolam (XANAX) 0.5 MG tablet; Take 1 tablet (0.5 mg total) by mouth at bedtime as needed for anxiety.  Dispense: 30 tablet; Refill: 5 - CMP14+EGFR - CBC with Differential/Platelet  3. Migraine without aura and without status migrainosus, not intractable - CMP14+EGFR - CBC with Differential/Platelet  4. Gastroesophageal reflux disease, unspecified whether esophagitis present - CMP14+EGFR - CBC with Differential/Platelet  5. Primary osteoarthritis involving multiple joints - CMP14+EGFR - CBC with Differential/Platelet  6. Benzodiazepine dependence (HCC) - CMP14+EGFR - CBC with Differential/Platelet  7. Controlled substance agreement signed - CMP14+EGFR - CBC with Differential/Platelet  8. Vitamin D deficiency - CMP14+EGFR - CBC with Differential/Platelet  9. Morbid obesity ([place]) - CMP14+EGFR - CBC with Differential/Platelet  10. Fibromyalgia - CMP14+EGFR - CBC with Differential/Platelet  11. Constipation, unspecified constipation type  Pt reviewed in [place] controlled database- No red flags, contract and drug screen up dated today Labs pending Health Maintenance reviewed Diet and exercise encouraged  Follow up plan: 6 months    Evelina Dun, FNP

## 2019-09-30 NOTE — Patient Instructions (Signed)
Health Maintenance After Age 70 After age 70, you are at a higher risk for certain long-term diseases and infections as well as injuries from falls. Falls are a major cause of broken bones and head injuries in people who are older than age 70. Getting regular preventive care can help to keep you healthy and well. Preventive care includes getting regular testing and making lifestyle changes as recommended by your health care provider. Talk with your health care provider about:  Which screenings and tests you should have. A screening is a test that checks for a disease when you have no symptoms.  A diet and exercise plan that is right for you. What should I know about screenings and tests to prevent falls? Screening and testing are the best ways to find a health problem early. Early diagnosis and treatment give you the best chance of managing medical conditions that are common after age 70. Certain conditions and lifestyle choices may make you more likely to have a fall. Your health care provider may recommend:  Regular vision checks. Poor vision and conditions such as cataracts can make you more likely to have a fall. If you wear glasses, make sure to get your prescription updated if your vision changes.  Medicine review. Work with your health care provider to regularly review all of the medicines you are taking, including over-the-counter medicines. Ask your health care provider about any side effects that may make you more likely to have a fall. Tell your health care provider if any medicines that you take make you feel dizzy or sleepy.  Osteoporosis screening. Osteoporosis is a condition that causes the bones to get weaker. This can make the bones weak and cause them to break more easily.  Blood pressure screening. Blood pressure changes and medicines to control blood pressure can make you feel dizzy.  Strength and balance checks. Your health care provider may recommend certain tests to check your  strength and balance while standing, walking, or changing positions.  Foot health exam. Foot pain and numbness, as well as not wearing proper footwear, can make you more likely to have a fall.  Depression screening. You may be more likely to have a fall if you have a fear of falling, feel emotionally low, or feel unable to do activities that you used to do.  Alcohol use screening. Using too much alcohol can affect your balance and may make you more likely to have a fall. What actions can I take to lower my risk of falls? General instructions  Talk with your health care provider about your risks for falling. Tell your health care provider if: ? You fall. Be sure to tell your health care provider about all falls, even ones that seem minor. ? You feel dizzy, sleepy, or off-balance.  Take over-the-counter and prescription medicines only as told by your health care provider. These include any supplements.  Eat a healthy diet and maintain a healthy weight. A healthy diet includes low-fat dairy products, low-fat (lean) meats, and fiber from whole grains, beans, and lots of fruits and vegetables. Home safety  Remove any tripping hazards, such as rugs, cords, and clutter.  Install safety equipment such as grab bars in bathrooms and safety rails on stairs.  Keep rooms and walkways well-lit. Activity   Follow a regular exercise program to stay fit. This will help you maintain your balance. Ask your health care provider what types of exercise are appropriate for you.  If you need a cane or   walker, use it as recommended by your health care provider.  Wear supportive shoes that have nonskid soles. Lifestyle  Do not drink alcohol if your health care provider tells you not to drink.  If you drink alcohol, limit how much you have: ? 0-1 drink a day for women. ? 0-2 drinks a day for men.  Be aware of how much alcohol is in your drink. In the [place] one drink equals one typical bottle of beer (12  oz), one-half glass of wine (5 oz), or one shot of hard liquor (1 oz).  Do not use any products that contain nicotine or tobacco, such as cigarettes and e-cigarettes. If you need help quitting, ask your health care provider. Summary  Having a healthy lifestyle and getting preventive care can help to protect your health and wellness after age 70.  Screening and testing are the best way to find a health problem early and help you avoid having a fall. Early diagnosis and treatment give you the best chance for managing medical conditions that are more common for people who are older than age 70.  Falls are a major cause of broken bones and head injuries in people who are older than age 70. Take precautions to prevent a fall at home.  Work with your health care provider to learn what changes you can make to improve your health and wellness and to prevent falls. This information is not intended to replace advice given to you by your health care provider. Make sure you discuss any questions you have with your health care provider. Document Revised: 11/05/2018 Document Reviewed: 05/28/2017 Elsevier Patient Education  2020 [employer]  

## 2019-10-01 ENCOUNTER — Ambulatory Visit: Payer: Medicare HMO | Admitting: Family

## 2019-10-07 LAB — TOXASSURE SELECT 13 (MW), URINE

## 2019-10-10 ENCOUNTER — Other Ambulatory Visit: Payer: Self-pay | Admitting: Family

## 2019-10-14 ENCOUNTER — Ambulatory Visit (INDEPENDENT_AMBULATORY_CARE_PROVIDER_SITE_OTHER): Payer: Medicare HMO | Admitting: Family

## 2019-10-14 ENCOUNTER — Other Ambulatory Visit: Payer: Self-pay

## 2019-10-14 ENCOUNTER — Encounter: Payer: Self-pay | Admitting: Family

## 2019-10-14 VITALS — BP 138/73 | HR 98 | Temp 97.2°F | Ht 66.0 in | Wt 218.6 lb

## 2019-10-14 DIAGNOSIS — Z808 Family history of malignant neoplasm of other organs or systems: Secondary | ICD-10-CM

## 2019-10-14 DIAGNOSIS — L989 Disorder of the skin and subcutaneous tissue, unspecified: Secondary | ICD-10-CM

## 2019-10-14 DIAGNOSIS — L820 Inflamed seborrheic keratosis: Secondary | ICD-10-CM

## 2019-10-14 DIAGNOSIS — L821 Other seborrheic keratosis: Secondary | ICD-10-CM

## 2019-10-14 DIAGNOSIS — D485 Neoplasm of uncertain behavior of skin: Secondary | ICD-10-CM | POA: Diagnosis not present

## 2019-10-14 DIAGNOSIS — D2371 Other benign neoplasm of skin of right lower limb, including hip: Secondary | ICD-10-CM | POA: Diagnosis not present

## 2019-10-14 DIAGNOSIS — L918 Other hypertrophic disorders of the skin: Secondary | ICD-10-CM | POA: Diagnosis not present

## 2019-10-14 NOTE — Progress Notes (Signed)
Subjective:    Patient ID: Cynthia Leon, female    DOB: 01-14-1951, 69 y.o.   MRN: [id number]  Chief Complaint  Patient presents with  . Nevus    HPI Pt presents to the office today a skin lesion on her right shoulder that has been there years. However, over the last year it has changed darker brown and has become larger. It also gets "hung" on her bar.   She also has a skin lesion in her right axilla that has been there for years, but has become larger. She is nervous that she will hit with her razor when she shaves .   She reports a burning pain of a 8 out 10 when she hits the lesion on her shoulder. She is nervous about this because she reports a family history of melanoma.   Denies any bleeding.    Review of Systems  All other systems reviewed and are negative.      Objective:   Physical Exam Vitals reviewed.  Constitutional:      General: She is not in acute distress.    Appearance: She is well-developed.  HENT:     Head: Normocephalic and atraumatic.  Eyes:     Pupils: Pupils are equal, round, and reactive to light.  Neck:     Thyroid: No thyromegaly.  Cardiovascular:     Rate and Rhythm: Normal rate and regular rhythm.     Heart sounds: Normal heart sounds. No murmur.  Pulmonary:     Effort: Pulmonary effort is normal. No respiratory distress.     Breath sounds: Normal breath sounds. No wheezing.  Abdominal:     General: Bowel sounds are normal. There is no distension.     Palpations: Abdomen is soft.     Tenderness: There is no abdominal tenderness.  Musculoskeletal:        General: No tenderness. Normal range of motion.     Cervical back: Normal range of motion and neck supple.  Skin:    General: Skin is warm and dry.     Findings: Lesion present.          Comments: Seborrheic keratosis approx 1.5X1.1 cm,   Large skin tag under axilla approx 0.8X0.5 cm   Neurological:     Mental Status: She is alert and oriented to person, place, and time.      Cranial Nerves: No cranial nerve deficit.     Deep Tendon Reflexes: Reflexes are normal and symmetric.  Psychiatric:        Behavior: Behavior normal.        Thought Content: Thought content normal.        Judgment: Judgment normal.     BP 138/73   Pulse 98   Temp (!) 97.2 F (36.2 C) (Temporal)   Ht 5\' 6"  (1.676 m)   Wt 218 lb 9.6 oz (99.2 kg)   SpO2 95%   BMI 35.28 kg/m   Verbal consent given by patient. Local anesthesia Lidocaine 1% with 74ml Betadine prep Shaved lesion on right shoulder, pt tolerated well Cleaned with Saline 24 hour Dressing applied  Verbal consent given by patient. Local anesthesia Lidocaine 1% with 2ml Betadine prep Shaved lesion on right axilla, pt tolerated well Cleaned with Saline 24 hour Dressing applied      Assessment & Plan:  Cynthia Leon comes in today with chief complaint of Nevus   Diagnosis and orders addressed:  1. Seborrheic keratosis - Pathology ([facility])  2. Skin  tag  3. Skin lesion - Pathology ([facility])  4. Family history of melanoma  Will send skin lesion off for pathology. I believe this is a Seborrheic keratosis, but patient is very nervous about it especially with her family hx melanoma.   Evelina Dun, FNP

## 2019-10-14 NOTE — Patient Instructions (Signed)
Seborrheic Keratosis °A seborrheic keratosis is a common, noncancerous (benign) skin growth. These growths are velvety, waxy, rough, tan, brown, or black spots that appear on the skin. These skin growths can be flat or raised, and scaly. °What are the causes? °The cause of this condition is not known. °What increases the risk? °You are more likely to develop this condition if you: °· Have a family history of seborrheic keratosis. °· Are 50 or older. °· Are pregnant. °· Have had estrogen replacement therapy. °What are the signs or symptoms? °Symptoms of this condition include growths on the face, chest, shoulders, back, or other areas. These growths: °· Are usually painless, but may become irritated and itchy. °· Can be yellow, brown, black, or other colors. °· Are slightly raised or have a flat surface. °· Are sometimes rough or wart-like in texture. °· Are often velvety or waxy on the surface. °· Are round or oval-shaped. °· Often occur in groups, but may occur as a single growth. °How is this diagnosed? °This condition is diagnosed with a medical history and physical exam. °· A sample of the growth may be tested (skin biopsy). °· You may need to see a skin specialist (dermatologist). °How is this treated? °Treatment is not usually needed for this condition, unless the growths are irritated or bleed often. °· You may also choose to have the growths removed if you do not like their appearance. °? Most commonly, these growths are treated with a procedure in which liquid nitrogen is applied to "freeze" off the growth (cryosurgery). °? They may also be burned off with electricity (electrocautery) or removed by scraping (curettage). °Follow these instructions at home: °· Watch your growth for any changes. °· Keep all follow-up visits as told by your health care provider. This is important. °· Do not scratch or pick at the growth or growths. This can cause them to become irritated or infected. °Contact a health care  provider if: °· You suddenly have many new growths. °· Your growth bleeds, itches, or hurts. °· Your growth suddenly becomes larger or changes color. °Summary °· A seborrheic keratosis is a common, noncancerous (benign) skin growth. °· Treatment is not usually needed for this condition, unless the growths are irritated or bleed often. °· Watch your growth for any changes. °· Contact a health care provider if you suddenly have many new growths or your growth suddenly becomes larger or changes color. °· Keep all follow-up visits as told by your health care provider. This is important. °This information is not intended to replace advice given to you by your health care provider. Make sure you discuss any questions you have with your health care provider. °Document Revised: 11/27/2017 Document Reviewed: 11/27/2017 °Elsevier Patient Education © 2020 [employer] ° °

## 2019-10-15 ENCOUNTER — Telehealth: Payer: Self-pay | Admitting: Family

## 2019-10-15 ENCOUNTER — Other Ambulatory Visit: Payer: Self-pay | Admitting: Family

## 2019-10-15 DIAGNOSIS — E1169 Type 2 diabetes mellitus with other specified complication: Secondary | ICD-10-CM

## 2019-10-15 NOTE — Progress Notes (Signed)
Referral placed.

## 2019-10-15 NOTE — Telephone Encounter (Signed)
  REFERRAL REQUEST Telephone Note 10/15/2019  What type of referral do you need? [facility]  Have you been seen at our office for this problem? Yes - patient has an upcoming appt and previous Ref has expired and needs a new Ref placed for Appt (Advise that they may need an appointment with their PCP before a referral can be done)  Is there a particular doctor or location that you prefer? [facility] [place].  Patient notified that referrals can take up to a week or longer to process. If they haven't heard anything within a week they should call back and speak with the referral department.

## 2019-10-15 NOTE — Telephone Encounter (Signed)
Referral placed.

## 2019-10-18 LAB — ANATOMIC PATHOLOGY REPORT: PDF Image: 0

## 2019-10-19 ENCOUNTER — Ambulatory Visit: Payer: Medicare HMO | Admitting: Nutrition

## 2019-11-02 DIAGNOSIS — Z1231 Encounter for screening mammogram for malignant neoplasm of breast: Secondary | ICD-10-CM | POA: Diagnosis not present

## 2019-11-24 ENCOUNTER — Other Ambulatory Visit: Payer: Self-pay | Admitting: Family

## 2019-11-24 DIAGNOSIS — E785 Hyperlipidemia, unspecified: Secondary | ICD-10-CM

## 2019-12-01 ENCOUNTER — Ambulatory Visit (INDEPENDENT_AMBULATORY_CARE_PROVIDER_SITE_OTHER): Payer: Medicare HMO

## 2019-12-01 DIAGNOSIS — Z0000 Encounter for general adult medical examination without abnormal findings: Secondary | ICD-10-CM | POA: Diagnosis not present

## 2019-12-01 NOTE — Progress Notes (Addendum)
MEDICARE ANNUAL WELLNESS VISIT  12/01/2019  Telephone Visit Disclaimer This Medicare AWV was conducted by telephone due to national recommendations for restrictions regarding the COVID-19 Pandemic (e.g. social distancing).  I verified, using two identifiers, that I am speaking with [name] or their authorized healthcare agent. I discussed the limitations, risks, security, and privacy concerns of performing an evaluation and management service by telephone and the potential availability of an in-person appointment in the future. The patient expressed understanding and agreed to proceed.   Subjective:  ZAHYRA BLITZ is a 69 y.o. female patient of Hawks, Theador Hawthorne, FNP who had a Medicare Annual Wellness Visit today via telephone. Katlyn is Retired and lives with their spouse. she has 3 biological children and two step children. she reports that she is socially active and does interact with friends/family regularly. she is minimally physically active and enjoys gardening.  Patient Care Team: [name], FNP as PCP - General (Family Medicine)  Advanced Directives 12/01/2019 11/30/2018 11/26/2017 11/25/2016 12/13/2015  Does Patient Have a Medical Advance Directive? Yes No No No No  Type of Advance Directive [place]  Does patient want to make changes to medical advance directive? No - Patient declined - - - -  Would patient like information on creating a medical advance directive? - Yes (MAU/Ambulatory/Procedural Areas - Information given) No - Patient declined Yes (MAU/Ambulatory/Procedural Areas - Information given) -    Hospital Utilization Over the Past 12 Months: # of hospitalizations or ER visits: 0 # of surgeries: 0  Review of Systems    Patient reports that her overall health is unchanged compared to last year.   Patient Reported Readings (BP, Pulse, CBG, Weight, etc) none  Pain Assessment Pain : No/denies pain     Current  Medications & Allergies (verified) Allergies as of 12/01/2019       Reactions   Mobic [meloxicam] Nausea And Vomiting   Asa [aspirin] Nausea And Vomiting        Medication List        Accurate as of Dec 01, 2019  9:19 AM. If you have any questions, ask your nurse or doctor.          ALPRAZolam 0.5 MG tablet Commonly known as: XANAX Take 1 tablet (0.5 mg total) by mouth at bedtime as needed for anxiety.   cyclobenzaprine 10 MG tablet Commonly known as: FLEXERIL TAKE 1 TABLET BY MOUTH THREE TIMES A DAY AS NEEDED FOR MUSCLE SPASMS   DULoxetine 60 MG capsule Commonly known as: CYMBALTA TAKE 1 CAPSULE BY MOUTH EVERY DAY   fluticasone 50 MCG/ACT nasal spray Commonly known as: FLONASE USE 2 SPRAYS IN EACH NOSTRIL ONCE DAILY   MUCINEX ALLERGY PO Take by mouth.   multivitamin with minerals tablet Take 1 tablet by mouth daily.   MYLANTA PO Take by mouth as needed.   Omeprazole-Sodium Bicarbonate 20-1100 MG Caps capsule Commonly known as: Zegerid OTC Take 1 capsule by mouth daily before breakfast.   rosuvastatin 20 MG tablet Commonly known as: CRESTOR TAKE 1 TABLET (20 MG TOTAL) DAILY BY MOUTH.   topiramate 50 MG tablet Commonly known as: TOPAMAX TAKE 1 TABLET BY MOUTH TWICE A DAY   Vitamin D (Ergocalciferol) 1.25 MG (50000 UNIT) Caps capsule Commonly known as: DRISDOL TAKE 1 CAPSULE (50,000 UNITS TOTAL) BY MOUTH EVERY 7 (SEVEN) DAYS.        History (reviewed): Past Medical History:  Diagnosis Date  Abdominal pain    Allergic rhinitis    Anxiety    Constipation    Fibromyalgia    GERD (gastroesophageal reflux disease)    Head ache    Hyperlipidemia    Osteopenia    PUD (peptic ulcer disease)    Right hip pain    Shingles    Past Surgical History:  Procedure Laterality Date   COLONOSCOPY N/A 12/13/2015   Procedure: COLONOSCOPY;  Surgeon: Rogene Houston, MD;  [facility]: [facility];  Service: Endoscopy;  Laterality: N/A;  4   TOTAL HIP  ARTHROPLASTY Right    Family History  Problem Relation Age of Onset   Breast cancer Mother    Coronary artery disease Mother    Cancer Mother        breast   Emphysema Father    COPD Father    Hypertension Father    Heart disease Father    Diabetes Sister    Cancer Sister        throat   Coronary artery disease Sister    Diabetes Sister    Esophageal cancer Sister    Cancer Brother        brain   Cancer Brother        bone   Cancer Brother        lung   Cancer Brother    Lupus Brother    Healthy Daughter    Healthy Daughter    Healthy Daughter    Healthy Daughter    Social History   Socioeconomic History   Marital status: Married    Spouse name: Not on file   Number of children: 3   Years of education: 11   Highest education level: 11th grade  Occupational History   Occupation: disabled   Tobacco Use   Smoking status: Passive Smoke Exposure - Never Smoker   Smokeless tobacco: Never Used  Substance and Sexual Activity   Alcohol use: No   Drug use: No   Sexual activity: Yes  Other Topics Concern   Not on file  Social History Narrative   Not on file   Social Determinants of Health   Financial Resource Strain:    Difficulty of Paying Living Expenses:   Food Insecurity:    Worried About Charity fundraiser in the Last Year:    Arboriculturist in the Last Year:   Transportation Needs:    Film/video editor (Medical):    Lack of Transportation (Non-Medical):   Physical Activity:    Days of Exercise per Week:    Minutes of Exercise per Session:   Stress:    Feeling of Stress :   Social Connections:    Frequency of Communication with Friends and Family:    Frequency of Social Gatherings with Friends and Family:    Attends Religious Services:    Active Member of Clubs or Organizations:    Attends Archivist Meetings:    Marital Status:     Activities of Daily Living In your present state of health, do you have any difficulty performing  the following activities: 12/01/2019  Hearing? Y  Vision? N  Difficulty concentrating or making decisions? Y  Walking or climbing stairs? N  Dressing or bathing? N  Doing errands, shopping? Y  Preparing Food and eating ? N  Using the Toilet? N  In the past six months, have you accidently leaked urine? N  Do you have problems with loss of bowel control?  N  Managing your Medications? N  Managing your Finances? Y  Comment Husband manages the finances.  Housekeeping or managing your Housekeeping? N  Some recent data might be hidden    Patient Education/ Literacy How often do you need to have someone help you when you read instructions, pamphlets, or other written materials from your doctor or pharmacy?: 2 - Rarely What is the last grade level you completed in school?: 11th grade  Exercise Current Exercise Habits: The patient does not participate in regular exercise at present, Exercise limited by: neurologic condition(s)  Diet Patient reports consuming 3 meals a day and 2 snack(s) a day Patient reports that her primary diet is: Regular Patient reports that she does have regular access to food.   Depression Screen PHQ 2/9 Scores 12/01/2019 10/14/2019 04/01/2019 11/30/2018 11/25/2018 09/29/2018 09/14/2018  PHQ - 2 Score 0 0 0 0 0 0 0  PHQ- 9 Score - - - - 0 - -     Fall Risk Fall Risk  12/01/2019 10/14/2019 09/30/2019 04/01/2019 11/30/2018  Falls in the past year? 0 0 0 0 0  Comment - - - - -  Number falls in past yr: - - - - -  Injury with Fall? - - - - -  Risk Factor Category  - - - - -  Risk for fall due to : - - - - -  Follow up - - - - -     Objective:  [name] seemed alert and oriented and she participated appropriately during our telephone visit.  Blood Pressure Weight BMI  BP Readings from Last 3 Encounters:  10/14/19 138/73  09/30/19 124/78  04/01/19 113/73   Wt Readings from Last 3 Encounters:  10/14/19 218 lb 9.6 oz (99.2 kg)  09/30/19 219 lb (99.3 kg)  05/03/19  215 lb (97.5 kg)   BMI Readings from Last 1 Encounters:  10/14/19 35.28 kg/m    *Unable to obtain current vital signs, weight, and BMI due to telephone visit type  Hearing/Vision  Annay did not seem to have difficulty with hearing/understanding during the telephone conversation Reports that she has not had a formal eye exam by an eye care professional within the past year Reports that she has not had a formal hearing evaluation within the past year *Unable to fully assess hearing and vision during telephone visit type  Cognitive Function: 6CIT Screen 12/01/2019 11/30/2018  What Year? 0 points 0 points  What month? 0 points 0 points  What time? 0 points 0 points  Count back from 20 0 points 0 points  Months in reverse 0 points 0 points  Repeat phrase 4 points 4 points  Total Score 4 4   (Normal:0-7, Significant for Dysfunction: >8)  Normal Cognitive Function Screening: Yes   Immunization & Health Maintenance Record Immunization History  Administered Date(s) Administered   Pneumococcal Conjugate-13 11/09/2015   Pneumococcal Polysaccharide-23 11/25/2016   Td 11/02/2008, 11/25/2018    Health Maintenance  Topic Date Due   COVID-19 Vaccine (1) Never done   INFLUENZA VACCINE  02/27/2020   URINE MICROALBUMIN  03/31/2020   HEMOGLOBIN A1C  04/01/2020   OPHTHALMOLOGY EXAM  07/29/2020   FOOT EXAM  09/29/2020   MAMMOGRAM  11/01/2021   COLONOSCOPY  12/12/2025   TETANUS/TDAP  11/24/2028   DEXA SCAN  Completed   Hepatitis C Screening  Completed   PNA vac Low Risk Adult  Completed       Assessment  This is a routine  wellness examination for URIJAH ROSO.  Health Maintenance: Due or Overdue Health Maintenance Due  Topic Date Due   COVID-19 Vaccine (1) Never done    [name] does not need a referral for Community Assistance: Care Management:   no Social Work:    no Prescription Assistance:  no Nutrition/Diabetes Education:  no   Plan:    Personalized Goals Goals Addressed             This Visit's Progress    Obtain Annual Eye (retinal)  Exam          Personalized Health Maintenance & Screening Recommendations   Lung Cancer Screening Recommended: no (Low Dose CT Chest recommended if Age 74-80 years, 30 pack-year currently smoking OR have quit w/in past 15 years) Hepatitis C Screening recommended: no HIV Screening recommended: no  Advanced Directives: Written information was not prepared per patient's request.  Referrals & Orders No orders of the defined types were placed in this encounter.   Follow-up Plan Follow-up with [name], FNP as planned Schedule 04/06/2020    I have personally reviewed and noted the following in the patient's chart:   Medical and social history Use of alcohol, tobacco or illicit drugs  Current medications and supplements Functional ability and status Nutritional status Physical activity Advanced directives List of other physicians Hospitalizations, surgeries, and ER visits in previous 12 months Vitals Screenings to include cognitive, depression, and falls Referrals and appointments  In addition, I have reviewed and discussed with [name] certain preventive protocols, quality metrics, and best practice recommendations. A written personalized care plan for preventive services as well as general preventive health recommendations is available and can be mailed to the patient at her request.      Maud Deed Eleny Cortez,LPN  [id number]     I have reviewed and agree with the above AWV documentation.   Evelina Dun, FNP

## 2019-12-01 NOTE — Patient Instructions (Addendum)
  [place] Maintenance Summary and Written Plan of Care  [name] ,  Thank you for allowing me to perform your Medicare Annual Wellness Visit and for your ongoing commitment to your health.   Health Maintenance & Immunization History Health Maintenance  Topic Date Due  . COVID-19 Vaccine (1) Never done  . INFLUENZA VACCINE  02/27/2020  . URINE MICROALBUMIN  03/31/2020  . HEMOGLOBIN A1C  04/01/2020  . OPHTHALMOLOGY EXAM  07/29/2020  . FOOT EXAM  09/29/2020  . MAMMOGRAM  11/01/2021  . COLONOSCOPY  12/12/2025  . TETANUS/TDAP  11/24/2028  . DEXA SCAN  Completed  . Hepatitis C Screening  Completed  . PNA vac Low Risk Adult  Completed   Immunization History  Administered Date(s) Administered  . Pneumococcal Conjugate-13 11/09/2015  . Pneumococcal Polysaccharide-23 11/25/2016  . Td 11/02/2008, 11/25/2018    These are the patient goals that we discussed: Goals Addressed            This Visit's Progress   . Obtain Annual Eye (retinal)  Exam               Exercise 150 minutes per week  This is a list of Health Maintenance Items that are overdue or due now: Health Maintenance Due  Topic Date Due  . COVID-19 Vaccine (1) Never done     Orders/Referrals Placed Today: No orders of the defined types were placed in this encounter.  (Contact our referral department at [phone] if you have not spoken with someone about your referral appointment within the next 5 days)    Follow-up Plan  Scheduled with Cynthia Dun, FNP on 04/06/2020 at 10:10am

## 2019-12-04 ENCOUNTER — Other Ambulatory Visit: Payer: Self-pay | Admitting: Family

## 2019-12-14 ENCOUNTER — Other Ambulatory Visit: Payer: Self-pay | Admitting: Family

## 2019-12-14 DIAGNOSIS — F411 Generalized anxiety disorder: Secondary | ICD-10-CM

## 2019-12-14 DIAGNOSIS — M797 Fibromyalgia: Secondary | ICD-10-CM

## 2020-01-06 ENCOUNTER — Other Ambulatory Visit: Payer: Self-pay | Admitting: Family

## 2020-01-20 DIAGNOSIS — Z029 Encounter for administrative examinations, unspecified: Secondary | ICD-10-CM

## 2020-01-25 DIAGNOSIS — Z0289 Encounter for other administrative examinations: Secondary | ICD-10-CM

## 2020-02-16 ENCOUNTER — Other Ambulatory Visit: Payer: Self-pay | Admitting: Family

## 2020-02-16 DIAGNOSIS — E785 Hyperlipidemia, unspecified: Secondary | ICD-10-CM

## 2020-02-23 ENCOUNTER — Ambulatory Visit (INDEPENDENT_AMBULATORY_CARE_PROVIDER_SITE_OTHER): Payer: Medicare HMO | Admitting: Family Medicine

## 2020-02-23 ENCOUNTER — Encounter: Payer: Self-pay | Admitting: Family Medicine

## 2020-02-23 DIAGNOSIS — Z711 Person with feared health complaint in whom no diagnosis is made: Secondary | ICD-10-CM | POA: Diagnosis not present

## 2020-02-23 NOTE — Progress Notes (Signed)
Virtual Visit via Telephone Note  I connected with Cynthia Leon on 02/23/20 at 1:14 PM by telephone and verified that I am speaking with the correct person using two identifiers. Cynthia Leon is currently located at home and her husband is currently with her during this visit. The provider, Loman Brooklyn, FNP is located in their office at time of visit.  I discussed the limitations, risks, security and privacy concerns of performing an evaluation and management service by telephone and the availability of in person appointments. I also discussed with the patient that there may be a patient responsible charge related to this service. The patient expressed understanding and agreed to proceed.  Subjective: PCP: [name], FNP  Chief Complaint  Patient presents with  . Diarrhea   Patient reports her bowel movements do not "smell right".  She wonders if she may have an infection or something wrong as she just knows this is not the way her bowel movements normally smell.  This has been going on for 3 weeks.  Denies diarrhea or constipation.  Stools are soft formed.  Denies any blood or mucus in the stool.  Last colonoscopy in 2017 which was normal per her report.   ROS: Per HPI  Current Outpatient Medications:  .  ALPRAZolam (XANAX) 0.5 MG tablet, Take 1 tablet (0.5 mg total) by mouth at bedtime as needed for anxiety., Disp: 30 tablet, Rfl: 5 .  Calcium & Magnesium Carbonates (MYLANTA PO), Take by mouth as needed.  , Disp: , Rfl:  .  cyclobenzaprine (FLEXERIL) 10 MG tablet, TAKE 1 TABLET BY MOUTH THREE TIMES A DAY AS NEEDED FOR MUSCLE SPASMS, Disp: 90 tablet, Rfl: 5 .  DULoxetine (CYMBALTA) 60 MG capsule, TAKE 1 CAPSULE BY MOUTH EVERY DAY, Disp: 90 capsule, Rfl: 0 .  Fexofenadine HCl (MUCINEX ALLERGY PO), Take by mouth., Disp: , Rfl:  .  fluticasone (FLONASE) 50 MCG/ACT nasal spray, USE 2 SPRAYS IN EACH NOSTRIL ONCE DAILY, Disp: 16 g, Rfl: 4 .  Multiple  Vitamins-Minerals (MULTIVITAMIN WITH MINERALS) tablet, Take 1 tablet by mouth daily., Disp: , Rfl:  .  Omeprazole-Sodium Bicarbonate (ZEGERID OTC) 20-1100 MG CAPS capsule, Take 1 capsule by mouth daily before breakfast., Disp: 28 each, Rfl: 0 .  rosuvastatin (CRESTOR) 20 MG tablet, TAKE 1 TABLET (20 MG TOTAL) DAILY BY MOUTH., Disp: 90 tablet, Rfl: 0 .  topiramate (TOPAMAX) 50 MG tablet, TAKE 1 TABLET BY MOUTH TWICE A DAY, Disp: 180 tablet, Rfl: 0 .  Vitamin D, Ergocalciferol, (DRISDOL) 1.25 MG (50000 UNIT) CAPS capsule, TAKE 1 CAPSULE (50,000 UNITS TOTAL) BY MOUTH EVERY 7 (SEVEN) DAYS., Disp: 12 capsule, Rfl: 0  Allergies  Allergen Reactions  . Mobic [Meloxicam] Nausea And Vomiting  . Asa [Aspirin] Nausea And Vomiting   Past Medical History:  Diagnosis Date  . Abdominal pain   . Allergic rhinitis   . Anxiety   . Constipation   . Fibromyalgia   . GERD (gastroesophageal reflux disease)   . Head ache   . Hyperlipidemia   . Osteopenia   . PUD (peptic ulcer disease)   . Right hip pain   . Shingles     Observations/Objective: A&O  No respiratory distress or wheezing audible over the phone Mood, judgement, and thought processes all WNL   Assessment and Plan: 1. Feared complaint without diagnosis - Reassurance provided.    Follow Up Instructions:  I discussed the assessment and treatment plan with the patient. The patient was provided  an opportunity to ask questions and all were answered. The patient agreed with the plan and demonstrated an understanding of the instructions.   The patient was advised to call back or seek an in-person evaluation if the symptoms worsen or if the condition fails to improve as anticipated.  The above assessment and management plan was discussed with the patient. The patient verbalized understanding of and has agreed to the management plan. Patient is aware to call the clinic if symptoms persist or worsen. Patient is aware when to return to the clinic  for a follow-up visit. Patient educated on when it is appropriate to go to the emergency department.   Time call ended: 1:20 PM  I provided 8 minutes of non-face-to-face time during this encounter.  Hendricks Limes, MSN, APRN, FNP-C [facility] 02/23/20

## 2020-03-03 DIAGNOSIS — R69 Illness, unspecified: Secondary | ICD-10-CM | POA: Diagnosis not present

## 2020-03-13 ENCOUNTER — Other Ambulatory Visit: Payer: Self-pay | Admitting: Family

## 2020-03-13 DIAGNOSIS — M797 Fibromyalgia: Secondary | ICD-10-CM

## 2020-03-13 DIAGNOSIS — F411 Generalized anxiety disorder: Secondary | ICD-10-CM

## 2020-03-21 DIAGNOSIS — E119 Type 2 diabetes mellitus without complications: Secondary | ICD-10-CM | POA: Diagnosis not present

## 2020-03-21 DIAGNOSIS — H5200 Hypermetropia, unspecified eye: Secondary | ICD-10-CM | POA: Diagnosis not present

## 2020-03-21 DIAGNOSIS — H2513 Age-related nuclear cataract, bilateral: Secondary | ICD-10-CM | POA: Diagnosis not present

## 2020-03-21 DIAGNOSIS — Z0100 Encounter for examination of eyes and vision without abnormal findings: Secondary | ICD-10-CM | POA: Diagnosis not present

## 2020-03-21 LAB — HM DIABETES EYE EXAM

## 2020-04-02 ENCOUNTER — Other Ambulatory Visit: Payer: Self-pay | Admitting: Family

## 2020-04-06 ENCOUNTER — Ambulatory Visit (INDEPENDENT_AMBULATORY_CARE_PROVIDER_SITE_OTHER): Payer: Medicare HMO | Admitting: Family

## 2020-04-06 ENCOUNTER — Encounter: Payer: Self-pay | Admitting: Family

## 2020-04-06 ENCOUNTER — Other Ambulatory Visit: Payer: Self-pay

## 2020-04-06 VITALS — BP 115/70 | HR 87 | Temp 98.4°F | Ht 66.0 in | Wt 220.4 lb

## 2020-04-06 DIAGNOSIS — E1169 Type 2 diabetes mellitus with other specified complication: Secondary | ICD-10-CM

## 2020-04-06 DIAGNOSIS — E785 Hyperlipidemia, unspecified: Secondary | ICD-10-CM | POA: Diagnosis not present

## 2020-04-06 DIAGNOSIS — G43009 Migraine without aura, not intractable, without status migrainosus: Secondary | ICD-10-CM

## 2020-04-06 DIAGNOSIS — R69 Illness, unspecified: Secondary | ICD-10-CM | POA: Diagnosis not present

## 2020-04-06 DIAGNOSIS — Z79899 Other long term (current) drug therapy: Secondary | ICD-10-CM

## 2020-04-06 DIAGNOSIS — F411 Generalized anxiety disorder: Secondary | ICD-10-CM

## 2020-04-06 DIAGNOSIS — K219 Gastro-esophageal reflux disease without esophagitis: Secondary | ICD-10-CM | POA: Diagnosis not present

## 2020-04-06 DIAGNOSIS — M8949 Other hypertrophic osteoarthropathy, multiple sites: Secondary | ICD-10-CM | POA: Diagnosis not present

## 2020-04-06 DIAGNOSIS — M797 Fibromyalgia: Secondary | ICD-10-CM | POA: Diagnosis not present

## 2020-04-06 DIAGNOSIS — M150 Primary generalized (osteo)arthritis: Secondary | ICD-10-CM

## 2020-04-06 DIAGNOSIS — F1320 Sedative, hypnotic or anxiolytic dependence, uncomplicated: Secondary | ICD-10-CM

## 2020-04-06 DIAGNOSIS — M159 Polyosteoarthritis, unspecified: Secondary | ICD-10-CM

## 2020-04-06 LAB — LIPID PANEL
Chol/HDL Ratio: 3.4 ratio (ref 0.0–4.4)
Cholesterol, Total: 161 mg/dL (ref 100–199)
HDL: 48 mg/dL (ref >39)
LDL Chol Calc (NIH): 78 mg/dL (ref 0–99)
Triglycerides: 212 mg/dL — ABNORMAL HIGH (ref 0–149)
VLDL Cholesterol Cal: 35 mg/dL (ref 5–40)

## 2020-04-06 LAB — CBC WITH DIFFERENTIAL/PLATELET
Basophils Absolute: 0.1 10*3/uL (ref 0.0–0.2)
Basos: 1 %
EOS (ABSOLUTE): 0.4 10*3/uL (ref 0.0–0.4)
Eos: 5 %
Hematocrit: 43.7 % (ref 34.0–46.6)
Hemoglobin: 14.2 g/dL (ref 11.1–15.9)
Immature Grans (Abs): 0 10*3/uL (ref 0.0–0.1)
Immature Granulocytes: 0 %
Lymphocytes Absolute: 1.7 10*3/uL (ref 0.7–3.1)
Lymphs: 22 %
MCH: 28.6 pg (ref 26.6–33.0)
MCHC: 32.5 g/dL (ref 31.5–35.7)
MCV: 88 fL (ref 79–97)
Monocytes Absolute: 0.5 10*3/uL (ref 0.1–0.9)
Monocytes: 6 %
Neutrophils Absolute: 5.1 10*3/uL (ref 1.4–7.0)
Neutrophils: 66 %
Platelets: 298 10*3/uL (ref 150–450)
RBC: 4.97 x10E6/uL (ref 3.77–5.28)
RDW: 13 % (ref 11.7–15.4)
WBC: 7.7 10*3/uL (ref 3.4–10.8)

## 2020-04-06 LAB — CMP14+EGFR
ALT: 14 IU/L (ref 0–32)
AST: 9 IU/L (ref 0–40)
Albumin/Globulin Ratio: 2.2 (ref 1.2–2.2)
Albumin: 4.6 g/dL (ref 3.8–4.8)
Alkaline Phosphatase: 83 IU/L (ref 48–121)
BUN/Creatinine Ratio: 9 — ABNORMAL LOW (ref 12–28)
BUN: 7 mg/dL — ABNORMAL LOW (ref 8–27)
Bilirubin Total: 0.3 mg/dL (ref 0.0–1.2)
CO2: 24 mmol/L (ref 20–29)
Calcium: 9.8 mg/dL (ref 8.7–10.3)
Chloride: 100 mmol/L (ref 96–106)
Creatinine, Ser: 0.74 mg/dL (ref 0.57–1.00)
GFR calc Af Amer: 96 mL/min/{1.73_m2} (ref >59)
GFR calc non Af Amer: 83 mL/min/{1.73_m2} (ref >59)
Globulin, Total: 2.1 g/dL (ref 1.5–4.5)
Glucose: 135 mg/dL — ABNORMAL HIGH (ref 65–99)
Potassium: 4.2 mmol/L (ref 3.5–5.2)
Sodium: 137 mmol/L (ref 134–144)
Total Protein: 6.7 g/dL (ref 6.0–8.5)

## 2020-04-06 LAB — BAYER DCA HB A1C WAIVED: HB A1C (BAYER DCA - WAIVED): 5.7 % (ref <7.0)

## 2020-04-06 MED ORDER — ALPRAZOLAM 0.5 MG PO TABS: 0.5000 mg | ORAL_TABLET | Freq: Every evening | ORAL | 5 refills | Status: DC | PRN

## 2020-04-06 NOTE — Progress Notes (Signed)
Subjective:    Patient ID: Cynthia Leon, female    DOB: 1951-01-15, 69 y.o.   MRN: [id number]  Chief Complaint  Patient presents with  . Medical Management of Chronic Issues    6 mth rck  . Diabetes   Pt presents to the office todayfor chronic follow up. She is currently do PT once a month.  Diabetes She presents for her follow-up diabetic visit. Her disease course has been stable. Hypoglycemia symptoms include headaches. Pertinent negatives for diabetes include no blurred vision. Symptoms are stable. Pertinent negatives for diabetic complications include no nephropathy or peripheral neuropathy. Risk factors for coronary artery disease include dyslipidemia, diabetes mellitus, hypertension, sedentary lifestyle and post-menopausal. Her weight is decreasing steadily. She is following a diabetic diet. Her overall blood glucose range is 90-110 mg/dl. An ACE inhibitor/angiotensin II receptor blocker is being taken. Eye exam is current.  Anxiety Presents for follow-up visit. Symptoms include depressed mood, excessive worry, irritability and restlessness. Symptoms occur most days. The severity of symptoms is moderate. The quality of sleep is good.    Depression        This is a chronic problem.  The current episode started more than 1 year ago.   The onset quality is gradual.   The problem has been waxing and waning since onset.  Associated symptoms include irritable, restlessness and headaches.  Associated symptoms include no helplessness and no hopelessness.  Past medical history includes anxiety.   Gastroesophageal Reflux She complains of belching and heartburn. This is a chronic problem. The current episode started more than 1 year ago. The problem occurs frequently. Risk factors include obesity. She has tried a PPI for the symptoms. The treatment provided moderate relief.  Hyperlipidemia This is a chronic problem. The current episode started more than 1 year ago. The problem is  controlled. Recent lipid tests were reviewed and are normal. Exacerbating diseases include obesity. She has no history of chronic renal disease. Current antihyperlipidemic treatment includes statins. Risk factors for coronary artery disease include dyslipidemia, hypertension, post-menopausal and a sedentary lifestyle.  Headache  This is a chronic problem. The current episode started more than 1 year ago. The problem occurs daily (has a slight headache daily). The pain quality is similar to prior headaches. Pertinent negatives include no blurred vision. Her past medical history is significant for obesity.      Review of Systems  Constitutional: Positive for irritability.  Eyes: Negative for blurred vision.  Gastrointestinal: Positive for heartburn.  Neurological: Positive for headaches.  Psychiatric/Behavioral: Positive for depression.  All other systems reviewed and are negative.      Objective:   Physical Exam Vitals reviewed.  Constitutional:      General: She is irritable. She is not in acute distress.    Appearance: She is well-developed. She is obese.  HENT:     Head: Normocephalic and atraumatic.     Right Ear: Tympanic membrane normal.     Left Ear: Tympanic membrane normal.  Eyes:     Pupils: Pupils are equal, round, and reactive to light.  Neck:     Thyroid: No thyromegaly.  Cardiovascular:     Rate and Rhythm: Normal rate and regular rhythm.     Heart sounds: Normal heart sounds. No murmur heard.   Pulmonary:     Effort: Pulmonary effort is normal. No respiratory distress.     Breath sounds: Normal breath sounds. No wheezing.  Abdominal:     General: Bowel sounds are normal. There  is no distension.     Palpations: Abdomen is soft.     Tenderness: There is no abdominal tenderness.  Musculoskeletal:        General: No tenderness. Normal range of motion.     Cervical back: Normal range of motion and neck supple.  Skin:    General: Skin is warm and dry.    Neurological:     Mental Status: She is alert and oriented to person, place, and time.     Cranial Nerves: No cranial nerve deficit.     Deep Tendon Reflexes: Reflexes are normal and symmetric.  Psychiatric:        Behavior: Behavior normal.        Thought Content: Thought content normal.        Judgment: Judgment normal.      BP 115/70   Pulse 87   Temp 98.4 F (36.9 C) (Temporal)   Ht _0  (1.676 m)   Wt 220 lb 6.4 oz (100 kg)   SpO2 95%   BMI 35.57 kg/m      Assessment & Plan:  Cynthia Leon comes in today with chief complaint of Medical Management of Chronic Issues (6 mth rck) and Diabetes   Diagnosis and orders addressed:  1. GAD (generalized anxiety disorder) - ALPRAZolam (XANAX) 0.5 MG tablet; Take 1 tablet (0.5 mg total) by mouth at bedtime as needed for anxiety.  Dispense: 30 tablet; Refill: 5 - CMP14+EGFR - CBC with Differential/Platelet  2. Migraine without aura and without status migrainosus, not intractable - CMP14+EGFR - CBC with Differential/Platelet  3. Gastroesophageal reflux disease, unspecified whether esophagitis present - CMP14+EGFR - CBC with Differential/Platelet  4. Type 2 diabetes mellitus with other specified complication, without long-term current use of insulin (HCC) - Bayer DCA Hb A1c Waived - CMP14+EGFR - CBC with Differential/Platelet - Microalbumin / creatinine urine ratio  5. Primary osteoarthritis involving multiple joints - CMP14+EGFR - CBC with Differential/Platelet  6. Benzodiazepine dependence (HCC) - ALPRAZolam (XANAX) 0.5 MG tablet; Take 1 tablet (0.5 mg total) by mouth at bedtime as needed for anxiety.  Dispense: 30 tablet; Refill: 5 - CMP14+EGFR - CBC with Differential/Platelet  7. Controlled substance agreement signed - ALPRAZolam (XANAX) 0.5 MG tablet; Take 1 tablet (0.5 mg total) by mouth at bedtime as needed for anxiety.  Dispense: 30 tablet; Refill: 5 - CMP14+EGFR - CBC with  Differential/Platelet  8. Fibromyalgia - CMP14+EGFR - CBC with Differential/Platelet  9. Hyperlipidemia, unspecified hyperlipidemia type - CMP14+EGFR - CBC with Differential/Platelet - Lipid panel  10. Morbid obesity ([place]) - CMP14+EGFR - CBC with Differential/Platelet   Labs pending Patient reviewed in [place] controlled database, no flags noted. Contract and drug screen are up to date Health Maintenance reviewed Diet and exercise encouraged  Follow up plan: 6 months    Evelina Dun, FNP

## 2020-04-06 NOTE — Patient Instructions (Signed)
Health Maintenance After Age 70 After age 70, you are at a higher risk for certain long-term diseases and infections as well as injuries from falls. Falls are a major cause of broken bones and head injuries in people who are older than age 70. Getting regular preventive care can help to keep you healthy and well. Preventive care includes getting regular testing and making lifestyle changes as recommended by your health care provider. Talk with your health care provider about:  Which screenings and tests you should have. A screening is a test that checks for a disease when you have no symptoms.  A diet and exercise plan that is right for you. What should I know about screenings and tests to prevent falls? Screening and testing are the best ways to find a health problem early. Early diagnosis and treatment give you the best chance of managing medical conditions that are common after age 70. Certain conditions and lifestyle choices may make you more likely to have a fall. Your health care provider may recommend:  Regular vision checks. Poor vision and conditions such as cataracts can make you more likely to have a fall. If you wear glasses, make sure to get your prescription updated if your vision changes.  Medicine review. Work with your health care provider to regularly review all of the medicines you are taking, including over-the-counter medicines. Ask your health care provider about any side effects that may make you more likely to have a fall. Tell your health care provider if any medicines that you take make you feel dizzy or sleepy.  Osteoporosis screening. Osteoporosis is a condition that causes the bones to get weaker. This can make the bones weak and cause them to break more easily.  Blood pressure screening. Blood pressure changes and medicines to control blood pressure can make you feel dizzy.  Strength and balance checks. Your health care provider may recommend certain tests to check your  strength and balance while standing, walking, or changing positions.  Foot health exam. Foot pain and numbness, as well as not wearing proper footwear, can make you more likely to have a fall.  Depression screening. You may be more likely to have a fall if you have a fear of falling, feel emotionally low, or feel unable to do activities that you used to do.  Alcohol use screening. Using too much alcohol can affect your balance and may make you more likely to have a fall. What actions can I take to lower my risk of falls? General instructions  Talk with your health care provider about your risks for falling. Tell your health care provider if: ? You fall. Be sure to tell your health care provider about all falls, even ones that seem minor. ? You feel dizzy, sleepy, or off-balance.  Take over-the-counter and prescription medicines only as told by your health care provider. These include any supplements.  Eat a healthy diet and maintain a healthy weight. A healthy diet includes low-fat dairy products, low-fat (lean) meats, and fiber from whole grains, beans, and lots of fruits and vegetables. Home safety  Remove any tripping hazards, such as rugs, cords, and clutter.  Install safety equipment such as grab bars in bathrooms and safety rails on stairs.  Keep rooms and walkways well-lit. Activity   Follow a regular exercise program to stay fit. This will help you maintain your balance. Ask your health care provider what types of exercise are appropriate for you.  If you need a cane or   walker, use it as recommended by your health care provider.  Wear supportive shoes that have nonskid soles. Lifestyle  Do not drink alcohol if your health care provider tells you not to drink.  If you drink alcohol, limit how much you have: ? 0-1 drink a day for women. ? 0-2 drinks a day for men.  Be aware of how much alcohol is in your drink. In the [place] one drink equals one typical bottle of beer (12  oz), one-half glass of wine (5 oz), or one shot of hard liquor (1 oz).  Do not use any products that contain nicotine or tobacco, such as cigarettes and e-cigarettes. If you need help quitting, ask your health care provider. Summary  Having a healthy lifestyle and getting preventive care can help to protect your health and wellness after age 70.  Screening and testing are the best way to find a health problem early and help you avoid having a fall. Early diagnosis and treatment give you the best chance for managing medical conditions that are more common for people who are older than age 70.  Falls are a major cause of broken bones and head injuries in people who are older than age 70. Take precautions to prevent a fall at home.  Work with your health care provider to learn what changes you can make to improve your health and wellness and to prevent falls. This information is not intended to replace advice given to you by your health care provider. Make sure you discuss any questions you have with your health care provider. Document Revised: 11/05/2018 Document Reviewed: 05/28/2017 Elsevier Patient Education  2020 [employer]  

## 2020-04-07 ENCOUNTER — Other Ambulatory Visit: Payer: Self-pay | Admitting: Family

## 2020-04-07 LAB — MICROALBUMIN / CREATININE URINE RATIO
Creatinine, Urine: 24.3 mg/dL
Microalb/Creat Ratio: 60 mg/g creat — ABNORMAL HIGH (ref 0–29)
Microalbumin, Urine: 14.5 ug/mL

## 2020-05-09 ENCOUNTER — Other Ambulatory Visit: Payer: Self-pay | Admitting: Family

## 2020-05-09 DIAGNOSIS — E785 Hyperlipidemia, unspecified: Secondary | ICD-10-CM

## 2020-05-18 DIAGNOSIS — G8929 Other chronic pain: Secondary | ICD-10-CM | POA: Diagnosis not present

## 2020-05-18 DIAGNOSIS — F411 Generalized anxiety disorder: Secondary | ICD-10-CM | POA: Diagnosis not present

## 2020-05-18 DIAGNOSIS — E785 Hyperlipidemia, unspecified: Secondary | ICD-10-CM | POA: Diagnosis not present

## 2020-05-18 DIAGNOSIS — N393 Stress incontinence (female) (male): Secondary | ICD-10-CM | POA: Diagnosis not present

## 2020-05-18 DIAGNOSIS — K5900 Constipation, unspecified: Secondary | ICD-10-CM | POA: Diagnosis not present

## 2020-05-18 DIAGNOSIS — R030 Elevated blood-pressure reading, without diagnosis of hypertension: Secondary | ICD-10-CM | POA: Diagnosis not present

## 2020-05-18 DIAGNOSIS — K219 Gastro-esophageal reflux disease without esophagitis: Secondary | ICD-10-CM | POA: Diagnosis not present

## 2020-05-18 DIAGNOSIS — R69 Illness, unspecified: Secondary | ICD-10-CM | POA: Diagnosis not present

## 2020-05-18 DIAGNOSIS — G43909 Migraine, unspecified, not intractable, without status migrainosus: Secondary | ICD-10-CM | POA: Diagnosis not present

## 2020-05-18 DIAGNOSIS — E669 Obesity, unspecified: Secondary | ICD-10-CM | POA: Diagnosis not present

## 2020-05-18 DIAGNOSIS — Z008 Encounter for other general examination: Secondary | ICD-10-CM | POA: Diagnosis not present

## 2020-06-09 ENCOUNTER — Other Ambulatory Visit: Payer: Self-pay | Admitting: Family

## 2020-06-09 DIAGNOSIS — F411 Generalized anxiety disorder: Secondary | ICD-10-CM

## 2020-06-09 DIAGNOSIS — M797 Fibromyalgia: Secondary | ICD-10-CM

## 2020-07-05 ENCOUNTER — Other Ambulatory Visit: Payer: Self-pay | Admitting: Family

## 2020-07-05 NOTE — Telephone Encounter (Signed)
OV 04/06/20 RTC 6 mos

## 2020-09-01 ENCOUNTER — Other Ambulatory Visit: Payer: Self-pay | Admitting: Family

## 2020-09-01 DIAGNOSIS — M797 Fibromyalgia: Secondary | ICD-10-CM

## 2020-09-01 DIAGNOSIS — F411 Generalized anxiety disorder: Secondary | ICD-10-CM

## 2020-09-20 DIAGNOSIS — K219 Gastro-esophageal reflux disease without esophagitis: Secondary | ICD-10-CM | POA: Diagnosis not present

## 2020-09-20 DIAGNOSIS — E785 Hyperlipidemia, unspecified: Secondary | ICD-10-CM | POA: Diagnosis not present

## 2020-09-20 DIAGNOSIS — G8929 Other chronic pain: Secondary | ICD-10-CM | POA: Diagnosis not present

## 2020-09-20 DIAGNOSIS — J301 Allergic rhinitis due to pollen: Secondary | ICD-10-CM | POA: Diagnosis not present

## 2020-09-20 DIAGNOSIS — G43909 Migraine, unspecified, not intractable, without status migrainosus: Secondary | ICD-10-CM | POA: Diagnosis not present

## 2020-09-20 DIAGNOSIS — Z79899 Other long term (current) drug therapy: Secondary | ICD-10-CM | POA: Diagnosis not present

## 2020-09-20 DIAGNOSIS — Z803 Family history of malignant neoplasm of breast: Secondary | ICD-10-CM | POA: Diagnosis not present

## 2020-09-20 DIAGNOSIS — M1990 Unspecified osteoarthritis, unspecified site: Secondary | ICD-10-CM | POA: Diagnosis not present

## 2020-09-20 DIAGNOSIS — N393 Stress incontinence (female) (male): Secondary | ICD-10-CM | POA: Diagnosis not present

## 2020-09-20 DIAGNOSIS — R69 Illness, unspecified: Secondary | ICD-10-CM | POA: Diagnosis not present

## 2020-10-04 ENCOUNTER — Other Ambulatory Visit: Payer: Self-pay

## 2020-10-04 ENCOUNTER — Encounter: Payer: Self-pay | Admitting: Family Medicine

## 2020-10-04 ENCOUNTER — Ambulatory Visit (INDEPENDENT_AMBULATORY_CARE_PROVIDER_SITE_OTHER): Payer: Medicare HMO | Admitting: Family Medicine

## 2020-10-04 VITALS — BP 132/69 | HR 84 | Temp 100.5°F | Ht 66.0 in | Wt 225.0 lb

## 2020-10-04 DIAGNOSIS — R509 Fever, unspecified: Secondary | ICD-10-CM

## 2020-10-04 DIAGNOSIS — R52 Pain, unspecified: Secondary | ICD-10-CM

## 2020-10-04 NOTE — Progress Notes (Signed)
Acute Office Visit  Subjective:    Patient ID: Cynthia Leon, female    DOB: 12-04-1950, 70 y.o.   MRN: [id number]  Chief Complaint  Patient presents with  . Joint Pain    HPI Patient is in today for body aches for about 10 days. She reports that it is in all her joints. She does have a history of fibromyalgia. She has been taking tylenol, cymbalta, and flexeril with a little relief. She has also tried taking a warm bath. She does have some head congestion for the last few days. Denies chills. Denies sore throat or cough. Reports some mild shortness of breath but this is her baseline. Denies chest pain, nausea, or vomiting. She reports that she just doesn't feel well. She denies known exposure to Covid. She has not been vaccinated.   Past Medical History:  Diagnosis Date  . Abdominal pain   . Allergic rhinitis   . Anxiety   . Constipation   . Fibromyalgia   . GERD (gastroesophageal reflux disease)   . Head ache   . Hyperlipidemia   . Osteopenia   . PUD (peptic ulcer disease)   . Right hip pain   . Shingles     Past Surgical History:  Procedure Laterality Date  . COLONOSCOPY N/A 12/13/2015   Procedure: COLONOSCOPY;  Surgeon: Rogene Houston, MD;  Location: [facility];  Service: Endoscopy;  Laterality: N/A;  1030  . TOTAL HIP ARTHROPLASTY Right     Family History  Problem Relation Age of Onset  . Breast cancer Mother   . Coronary artery disease Mother   . Cancer Mother        breast  . Emphysema Father   . COPD Father   . Hypertension Father   . Heart disease Father   . Diabetes Sister   . Cancer Sister        throat  . Coronary artery disease Sister   . Diabetes Sister   . Esophageal cancer Sister   . Cancer Brother        brain  . Cancer Brother        bone  . Cancer Brother        lung  . Cancer Brother   . Lupus Brother   . Healthy Daughter   . Healthy Daughter   . Healthy Daughter   . Healthy Daughter     Social History    Socioeconomic History  . Marital status: Married    Spouse name: Not on file  . Number of children: 3  . Years of education: 51  . Highest education level: 11th grade  Occupational History  . Occupation: disabled   Tobacco Use  . Smoking status: Passive Smoke Exposure - Never Smoker  . Smokeless tobacco: Never Used  Vaping Use  . Vaping Use: Never used  Substance and Sexual Activity  . Alcohol use: No  . Drug use: No  . Sexual activity: Yes  Other Topics Concern  . Not on file  Social History Narrative  . Not on file   Social Determinants of Health   Financial Resource Strain: Not on file  Food Insecurity: Not on file  Transportation Needs: Not on file  Physical Activity: Not on file  Stress: Not on file  Social Connections: Not on file  Intimate Partner Violence: Not on file    Outpatient Medications Prior to Visit  Medication Sig Dispense Refill  . ALPRAZolam (XANAX) 0.5 MG tablet Take 1  tablet (0.5 mg total) by mouth at bedtime as needed for anxiety. 30 tablet 5  . Calcium & Magnesium Carbonates (MYLANTA PO) Take by mouth as needed.    . cyclobenzaprine (FLEXERIL) 10 MG tablet TAKE 1 TABLET BY MOUTH THREE TIMES A DAY AS NEEDED FOR MUSCLE SPASMS 90 tablet 5  . DULoxetine (CYMBALTA) 60 MG capsule Take 1 capsule (60 mg total) by mouth daily. NEEDS TO BE SEEN FOR FURTHER REFILLS 90 capsule 0  . Fexofenadine HCl (MUCINEX ALLERGY PO) Take by mouth.    . fluticasone (FLONASE) 50 MCG/ACT nasal spray USE 2 SPRAYS IN EACH NOSTRIL ONCE DAILY 16 g 4  . Multiple Vitamins-Minerals (MULTIVITAMIN WITH MINERALS) tablet Take 1 tablet by mouth daily.    E[employer] Bicarbonate (ZEGERID OTC) 20-1100 MG CAPS capsule Take 1 capsule by mouth daily before breakfast. 28 each 0  . rosuvastatin (CRESTOR) 20 MG tablet TAKE 1 TABLET (20 MG TOTAL) DAILY BY MOUTH. 90 tablet 1  . topiramate (TOPAMAX) 50 MG tablet TAKE 1 TABLET BY MOUTH TWICE A DAY 180 tablet 0  . Vitamin D,  Ergocalciferol, (DRISDOL) 1.25 MG (50000 UNIT) CAPS capsule TAKE 1 CAPSULE (50,000 UNITS TOTAL) BY MOUTH EVERY 7 (SEVEN) DAYS. 12 capsule 0   No facility-administered medications prior to visit.    Allergies  Allergen Reactions  . Mobic [Meloxicam] Nausea And Vomiting  . Asa [Aspirin] Nausea And Vomiting    Review of Systems As per HPI.     Objective:    Physical Exam Vitals and nursing note reviewed.  Constitutional:      General: She is not in acute distress.    Appearance: She is not ill-appearing, toxic-appearing or diaphoretic.  HENT:     Head: Normocephalic and atraumatic.     Nose: Congestion present.  Eyes:     Extraocular Movements: Extraocular movements intact.     Conjunctiva/sclera: Conjunctivae normal.     Pupils: Pupils are equal, round, and reactive to light.  Cardiovascular:     Rate and Rhythm: Normal rate and regular rhythm.     Heart sounds: Normal heart sounds. No murmur heard.   Pulmonary:     Effort: Pulmonary effort is normal. No respiratory distress.     Breath sounds: Normal breath sounds.  Musculoskeletal:     Right lower leg: No edema.     Left lower leg: No edema.  Skin:    General: Skin is warm and dry.  Neurological:     General: No focal deficit present.     Mental Status: She is alert and oriented to person, place, and time.  Psychiatric:        Mood and Affect: Mood normal.        Behavior: Behavior normal.        Thought Content: Thought content normal.        Judgment: Judgment normal.     BP 132/69   Pulse 84   Temp (!) 100.5 F (38.1 C) (Oral)   Ht 5\' 6"  (1.676 m)   Wt 225 lb (102.1 kg)   SpO2 97%   BMI 36.32 kg/m  Wt Readings from Last 3 Encounters:  10/04/20 225 lb (102.1 kg)  04/06/20 220 lb 6.4 oz (100 kg)  10/14/19 218 lb 9.6 oz (99.2 kg)    Health Maintenance Due  Topic Date Due  . COVID-19 Vaccine (1) Never done  . FOOT EXAM  09/29/2020  . HEMOGLOBIN A1C  10/04/2020    There are no preventive  care  reminders to display for this patient.   Lab Results  Component Value Date   TSH 2.060 12/03/2017   Lab Results  Component Value Date   WBC 7.7 04/06/2020   HGB 14.2 04/06/2020   HCT 43.7 04/06/2020   MCV 88 04/06/2020   PLT 298 04/06/2020   Lab Results  Component Value Date   NA 137 04/06/2020   K 4.2 04/06/2020   CO2 24 04/06/2020   GLUCOSE 135 (H) 04/06/2020   BUN 7 (L) 04/06/2020   CREATININE 0.74 04/06/2020   BILITOT 0.3 04/06/2020   ALKPHOS 83 04/06/2020   AST 9 04/06/2020   ALT 14 04/06/2020   PROT 6.7 04/06/2020   ALBUMIN 4.6 04/06/2020   CALCIUM 9.8 04/06/2020   Lab Results  Component Value Date   CHOL 161 04/06/2020   Lab Results  Component Value Date   HDL 48 04/06/2020   Lab Results  Component Value Date   LDLCALC 78 04/06/2020   Lab Results  Component Value Date   TRIG 212 (H) 04/06/2020   Lab Results  Component Value Date   CHOLHDL 3.4 04/06/2020   Lab Results  Component Value Date   HGBA1C 5.7 04/06/2020       Assessment & Plan:   Cynthia Leon was seen today for joint pain.  Diagnoses and all orders for this visit:  Fever, unspecified fever cause 100.5 at today's visit. Body aches x 10 days and congestion x 2-3 days. Declined flu testing today. Covid test pending, quarantine until results. Discussed quarantine for 10 days from symptom start if positive. Tylenol for fever and aches. Stay well hydrated and rest. Return to office for new or worsening symptoms, or if symptoms persist.  -     Novel Coronavirus, NAA ([facility])  Body aches Discussed that body aches are likely due to fever and likely viral illness. Tylenol, rest. Covid test pending. Return to office for new or worsening symptoms, or if symptoms persist.  -     Novel Coronavirus, NAA ([facility])  Reminded patient to schedule chronic follow up appointment with PCP.   The patient indicates understanding of these issues and agrees with the plan.   Gwenlyn Perking, FNP

## 2020-10-05 LAB — SARS-COV-2, NAA 2 DAY TAT

## 2020-10-05 LAB — NOVEL CORONAVIRUS, NAA: SARS-CoV-2, NAA: NOT DETECTED

## 2020-11-21 ENCOUNTER — Encounter (INDEPENDENT_AMBULATORY_CARE_PROVIDER_SITE_OTHER): Payer: Self-pay | Admitting: *Deleted

## 2020-11-23 ENCOUNTER — Encounter: Payer: Self-pay | Admitting: Family Medicine

## 2020-11-23 ENCOUNTER — Ambulatory Visit (INDEPENDENT_AMBULATORY_CARE_PROVIDER_SITE_OTHER): Payer: Medicare HMO | Admitting: Family Medicine

## 2020-11-23 VITALS — BP 127/73 | HR 86 | Temp 98.1°F

## 2020-11-23 DIAGNOSIS — R059 Cough, unspecified: Secondary | ICD-10-CM | POA: Diagnosis not present

## 2020-11-23 DIAGNOSIS — N3000 Acute cystitis without hematuria: Secondary | ICD-10-CM | POA: Diagnosis not present

## 2020-11-23 DIAGNOSIS — R300 Dysuria: Secondary | ICD-10-CM | POA: Diagnosis not present

## 2020-11-23 LAB — URINALYSIS, ROUTINE W REFLEX MICROSCOPIC
Bilirubin, UA: NEGATIVE
Glucose, UA: NEGATIVE
Ketones, UA: NEGATIVE
Nitrite, UA: NEGATIVE
Protein,UA: NEGATIVE
Specific Gravity, UA: 1.01 (ref 1.005–1.030)
Urobilinogen, Ur: 0.2 mg/dL (ref 0.2–1.0)
pH, UA: 6.5 (ref 5.0–7.5)

## 2020-11-23 LAB — MICROSCOPIC EXAMINATION
RBC, Urine: NONE SEEN /hpf (ref 0–2)
WBC, UA: >30 /hpf — AB (ref 0–5)

## 2020-11-23 MED ORDER — CETIRIZINE HCL 10 MG PO TABS: 10.0000 mg | ORAL_TABLET | Freq: Every day | ORAL | 11 refills | Status: DC

## 2020-11-23 MED ORDER — CEPHALEXIN 500 MG PO CAPS: 500.0000 mg | ORAL_CAPSULE | Freq: Two times a day (BID) | ORAL | 0 refills | Status: DC

## 2020-11-23 MED ORDER — BENZONATATE 100 MG PO CAPS: 100.0000 mg | ORAL_CAPSULE | Freq: Three times a day (TID) | ORAL | 0 refills | Status: DC | PRN

## 2020-11-23 NOTE — Progress Notes (Signed)
Acute Office Visit  Subjective:    Patient ID: Cynthia Leon, female    DOB: Oct 29, 1950, 70 y.o.   MRN: [id number]  Chief Complaint  Patient presents with  . Cough    HPI Patient is in today for cough for 2-3 weeks. She reports coughing up some white and sometimes green phlegm. Denies shortness of breath or chest pain. Denies fever, chills, congestion, or sore throat. She has tried mucinex without relief. She has a history of seasonal allergies. She does not take anything for allergies. Denies history of COPD or asthma. She is not a smoker but is exposed to 2nd hand smoke.   She also reports dysuria and urgency x 1-2 weeks. Denies lower abdominal pain, nausea, vomiting, or hematuria. She does have some lower back pain.    Past Medical History:  Diagnosis Date  . Abdominal pain   . Allergic rhinitis   . Anxiety   . Constipation   . Fibromyalgia   . GERD (gastroesophageal reflux disease)   . Head ache   . Hyperlipidemia   . Osteopenia   . PUD (peptic ulcer disease)   . Right hip pain   . Shingles     Past Surgical History:  Procedure Laterality Date  . COLONOSCOPY N/A 12/13/2015   Procedure: COLONOSCOPY;  Surgeon: Rogene Houston, MD;  Location: [facility];  Service: Endoscopy;  Laterality: N/A;  1030  . TOTAL HIP ARTHROPLASTY Right     Family History  Problem Relation Age of Onset  . Breast cancer Mother   . Coronary artery disease Mother   . Cancer Mother        breast  . Emphysema Father   . COPD Father   . Hypertension Father   . Heart disease Father   . Diabetes Sister   . Cancer Sister        throat  . Coronary artery disease Sister   . Diabetes Sister   . Esophageal cancer Sister   . Cancer Brother        brain  . Cancer Brother        bone  . Cancer Brother        lung  . Cancer Brother   . Lupus Brother   . Healthy Daughter   . Healthy Daughter   . Healthy Daughter   . Healthy Daughter     Social History   Socioeconomic History   . Marital status: Married    Spouse name: Not on file  . Number of children: 3  . Years of education: 51  . Highest education level: 11th grade  Occupational History  . Occupation: disabled   Tobacco Use  . Smoking status: Passive Smoke Exposure - Never Smoker  . Smokeless tobacco: Never Used  Vaping Use  . Vaping Use: Never used  Substance and Sexual Activity  . Alcohol use: No  . Drug use: No  . Sexual activity: Yes  Other Topics Concern  . Not on file  Social History Narrative  . Not on file   Social Determinants of Health   Financial Resource Strain: Not on file  Food Insecurity: Not on file  Transportation Needs: Not on file  Physical Activity: Not on file  Stress: Not on file  Social Connections: Not on file  Intimate Partner Violence: Not on file    Outpatient Medications Prior to Visit  Medication Sig Dispense Refill  . ALPRAZolam (XANAX) 0.5 MG tablet Take 1 tablet (0.5 mg total)  by mouth at bedtime as needed for anxiety. 30 tablet 5  . Calcium & Magnesium Carbonates (MYLANTA PO) Take by mouth as needed.    . cyclobenzaprine (FLEXERIL) 10 MG tablet TAKE 1 TABLET BY MOUTH THREE TIMES A DAY AS NEEDED FOR MUSCLE SPASMS 90 tablet 5  . DULoxetine (CYMBALTA) 60 MG capsule Take 1 capsule (60 mg total) by mouth daily. NEEDS TO BE SEEN FOR FURTHER REFILLS 90 capsule 0  . Multiple Vitamins-Minerals (MULTIVITAMIN WITH MINERALS) tablet Take 1 tablet by mouth daily.    E[employer] Bicarbonate (ZEGERID OTC) 20-1100 MG CAPS capsule Take 1 capsule by mouth daily before breakfast. 28 each 0  . rosuvastatin (CRESTOR) 20 MG tablet TAKE 1 TABLET (20 MG TOTAL) DAILY BY MOUTH. 90 tablet 1  . topiramate (TOPAMAX) 50 MG tablet TAKE 1 TABLET BY MOUTH TWICE A DAY 180 tablet 0  . Vitamin D, Ergocalciferol, (DRISDOL) 1.25 MG (50000 UNIT) CAPS capsule TAKE 1 CAPSULE (50,000 UNITS TOTAL) BY MOUTH EVERY 7 (SEVEN) DAYS. 12 capsule 0  . Fexofenadine HCl (MUCINEX ALLERGY PO) Take by  mouth. (Patient not taking: Reported on 11/23/2020)    . fluticasone (FLONASE) 50 MCG/ACT nasal spray USE 2 SPRAYS IN EACH NOSTRIL ONCE DAILY (Patient not taking: Reported on 11/23/2020) 16 g 4   No facility-administered medications prior to visit.    Allergies  Allergen Reactions  . Mobic [Meloxicam] Nausea And Vomiting  . Asa [Aspirin] Nausea And Vomiting    Review of Systems  As per HPI.     Objective:    Physical Exam Vitals and nursing note reviewed.  Constitutional:      General: She is not in acute distress.    Appearance: She is not ill-appearing, toxic-appearing or diaphoretic.  HENT:     Right Ear: Tympanic membrane, ear canal and external ear normal.     Left Ear: Ear canal and external ear normal.     Nose: Nose normal.     Mouth/Throat:     Mouth: Mucous membranes are moist.     Pharynx: Oropharynx is clear. No oropharyngeal exudate or posterior oropharyngeal erythema.  Cardiovascular:     Rate and Rhythm: Normal rate and regular rhythm.     Heart sounds: Normal heart sounds. No murmur heard.   Pulmonary:     Effort: Pulmonary effort is normal. No respiratory distress.     Breath sounds: Normal breath sounds.  Abdominal:     General: Bowel sounds are normal. There is no distension.     Palpations: Abdomen is soft.     Tenderness: There is no abdominal tenderness. There is no right CVA tenderness, left CVA tenderness, guarding or rebound.  Neurological:     Mental Status: She is alert.  Psychiatric:        Mood and Affect: Mood normal.        Behavior: Behavior normal.     BP 127/73   Pulse 86   Temp 98.1 F (36.7 C) (Temporal)   SpO2 98%  Wt Readings from Last 3 Encounters:  10/04/20 225 lb (102.1 kg)  04/06/20 220 lb 6.4 oz (100 kg)  10/14/19 218 lb 9.6 oz (99.2 kg)   Urine dipstick shows positive for RBC's and positive for leukocytes.  Micro exam: >30 WBC's per HPF, 0 RBC's per HPF and few+ bacteria.  Health Maintenance Due  Topic Date Due   . COVID-19 Vaccine (1) Never done  . FOOT EXAM  09/29/2020  . HEMOGLOBIN A1C  10/04/2020  There are no preventive care reminders to display for this patient.   Lab Results  Component Value Date   TSH 2.060 12/03/2017   Lab Results  Component Value Date   WBC 7.7 04/06/2020   HGB 14.2 04/06/2020   HCT 43.7 04/06/2020   MCV 88 04/06/2020   PLT 298 04/06/2020   Lab Results  Component Value Date   NA 137 04/06/2020   K 4.2 04/06/2020   CO2 24 04/06/2020   GLUCOSE 135 (H) 04/06/2020   BUN 7 (L) 04/06/2020   CREATININE 0.74 04/06/2020   BILITOT 0.3 04/06/2020   ALKPHOS 83 04/06/2020   AST 9 04/06/2020   ALT 14 04/06/2020   PROT 6.7 04/06/2020   ALBUMIN 4.6 04/06/2020   CALCIUM 9.8 04/06/2020   Lab Results  Component Value Date   CHOL 161 04/06/2020   Lab Results  Component Value Date   HDL 48 04/06/2020   Lab Results  Component Value Date   LDLCALC 78 04/06/2020   Lab Results  Component Value Date   TRIG 212 (H) 04/06/2020   Lab Results  Component Value Date   CHOLHDL 3.4 04/06/2020   Lab Results  Component Value Date   HGBA1C 5.7 04/06/2020       Assessment & Plan:   Wynne was seen today for cough.  Diagnoses and all orders for this visit:  Acute cystitis without hematuria Start keflex as below. Culture pending. Stay well hydrated.  -     Urinalysis, Routine w reflex microscopic -     Urine Culture       -     Microscopic Examination -     cephALEXin (KEFLEX) 500 MG capsule; Take 1 capsule (500 mg total) by mouth 2 (two) times daily.  Cough Lungs clear on ascultation today. Tessalon perles as needed. Zyrtec daily. -     benzonatate (TESSALON PERLES) 100 MG capsule; Take 1 capsule (100 mg total) by mouth 3 (three) times daily as needed for cough. -     cetirizine (ZYRTEC) 10 MG tablet; Take 1 tablet (10 mg total) by mouth daily.  Return to office for new or worsening symptoms, or if symptoms persist.   The patient indicates  understanding of these issues and agrees with the plan.   Gwenlyn Perking, FNP

## 2020-11-27 ENCOUNTER — Other Ambulatory Visit: Payer: Self-pay | Admitting: Family

## 2020-11-27 DIAGNOSIS — M797 Fibromyalgia: Secondary | ICD-10-CM

## 2020-11-27 DIAGNOSIS — F411 Generalized anxiety disorder: Secondary | ICD-10-CM

## 2020-11-30 LAB — URINE CULTURE

## 2020-12-01 ENCOUNTER — Encounter (INDEPENDENT_AMBULATORY_CARE_PROVIDER_SITE_OTHER): Payer: Self-pay | Admitting: *Deleted

## 2020-12-01 ENCOUNTER — Ambulatory Visit (INDEPENDENT_AMBULATORY_CARE_PROVIDER_SITE_OTHER): Payer: Medicare HMO

## 2020-12-01 VITALS — Ht 66.0 in | Wt 210.0 lb

## 2020-12-01 DIAGNOSIS — Z1231 Encounter for screening mammogram for malignant neoplasm of breast: Secondary | ICD-10-CM

## 2020-12-01 DIAGNOSIS — Z1211 Encounter for screening for malignant neoplasm of colon: Secondary | ICD-10-CM | POA: Diagnosis not present

## 2020-12-01 DIAGNOSIS — Z0000 Encounter for general adult medical examination without abnormal findings: Secondary | ICD-10-CM | POA: Diagnosis not present

## 2020-12-01 DIAGNOSIS — Z780 Asymptomatic menopausal state: Secondary | ICD-10-CM

## 2020-12-01 NOTE — Patient Instructions (Signed)
Cynthia Leon , Thank you for taking time to come for your Medicare Wellness Visit. I appreciate your ongoing commitment to your health goals. Please review the following plan we discussed and let me know if I can assist you in the future.   Screening recommendations/referrals: Colonoscopy: Done 12/13/2015 - Repeat 5 years - referral sent today Mammogram: 11/05/2019 - Repeat annually - order placed today Bone Density: Done 11/09/2015 - Repeat 5 years - order placed today Recommended yearly ophthalmology/optometry visit for glaucoma screening and checkup Recommended yearly dental visit for hygiene and checkup  Vaccinations: Influenza vaccine: Declined Pneumococcal vaccine: Done 11/09/2015 & 11/25/2016 Tdap vaccine: Done 11/25/2018 - Repeat every 10 years Shingles vaccine: Shingrix discussed. Please contact your pharmacy for coverage information.    Covid-19: Declined  Advanced directives: Please bring a copy of your health care power of attorney and living will to the office to be added to your chart at your convenience.  Conditions/risks identified: Aim for 30 minutes of exercise or brisk walking each day, drink 6-8 glasses of water and eat lots of fruits and vegetables.  Next appointment: Follow up in one year for your annual wellness visit    Preventive Care 65 Years and Older, Female Preventive care refers to lifestyle choices and visits with your health care provider that can promote health and wellness. What does preventive care include?  A yearly physical exam. This is also called an annual well check.  Dental exams once or twice a year.  Routine eye exams. Ask your health care provider how often you should have your eyes checked.  Personal lifestyle choices, including:  Daily care of your teeth and gums.  Regular physical activity.  Eating a healthy diet.  Avoiding tobacco and drug use.  Limiting alcohol use.  Practicing safe sex.  Taking low-dose aspirin every  day.  Taking vitamin and mineral supplements as recommended by your health care provider. What happens during an annual well check? The services and screenings done by your health care provider during your annual well check will depend on your age, overall health, lifestyle risk factors, and family history of disease. Counseling  Your health care provider may ask you questions about your:  Alcohol use.  Tobacco use.  Drug use.  Emotional well-being.  Home and relationship well-being.  Sexual activity.  Eating habits.  History of falls.  Memory and ability to understand (cognition).  Work and work Statistician.  Reproductive health. Screening  You may have the following tests or measurements:  Height, weight, and BMI.  Blood pressure.  Lipid and cholesterol levels. These may be checked every 5 years, or more frequently if you are over 78 years old.  Skin check.  Lung cancer screening. You may have this screening every year starting at age 77 if you have a 30-pack-year history of smoking and currently smoke or have quit within the past 15 years.  Fecal occult blood test (FOBT) of the stool. You may have this test every year starting at age 10.  Flexible sigmoidoscopy or colonoscopy. You may have a sigmoidoscopy every 5 years or a colonoscopy every 10 years starting at age 48.  Hepatitis C blood test.  Hepatitis B blood test.  Sexually transmitted disease (STD) testing.  Diabetes screening. This is done by checking your blood sugar (glucose) after you have not eaten for a while (fasting). You may have this done every 1-3 years.  Bone density scan. This is done to screen for osteoporosis. You may have this done  starting at age 41.  Mammogram. This may be done every 1-2 years. Talk to your health care provider about how often you should have regular mammograms. Talk with your health care provider about your test results, treatment options, and if necessary, the need  for more tests. Vaccines  Your health care provider may recommend certain vaccines, such as:  Influenza vaccine. This is recommended every year.  Tetanus, diphtheria, and acellular pertussis (Tdap, Td) vaccine. You may need a Td booster every 10 years.  Zoster vaccine. You may need this after age 27.  Pneumococcal 13-valent conjugate (PCV13) vaccine. One dose is recommended after age 32.  Pneumococcal polysaccharide (PPSV23) vaccine. One dose is recommended after age 47. Talk to your health care provider about which screenings and vaccines you need and how often you need them. This information is not intended to replace advice given to you by your health care provider. Make sure you discuss any questions you have with your health care provider. Document Released: 08/11/2015 Document Revised: 04/03/2016 Document Reviewed: 05/16/2015 Elsevier Interactive Patient Education  2017 [place] Prevention in the Home Falls can cause injuries. They can happen to people of all ages. There are many things you can do to make your home safe and to help prevent falls. What can I do on the outside of my home?  Regularly fix the edges of walkways and driveways and fix any cracks.  Remove anything that might make you trip as you walk through a door, such as a raised step or threshold.  Trim any bushes or trees on the path to your home.  Use bright outdoor lighting.  Clear any walking paths of anything that might make someone trip, such as rocks or tools.  Regularly check to see if handrails are loose or broken. Make sure that both sides of any steps have handrails.  Any raised decks and porches should have guardrails on the edges.  Have any leaves, snow, or ice cleared regularly.  Use sand or salt on walking paths during winter.  Clean up any spills in your garage right away. This includes oil or grease spills. What can I do in the bathroom?  Use night lights.  Install grab bars  by the toilet and in the tub and shower. Do not use towel bars as grab bars.  Use non-skid mats or decals in the tub or shower.  If you need to sit down in the shower, use a plastic, non-slip stool.  Keep the floor dry. Clean up any water that spills on the floor as soon as it happens.  Remove soap buildup in the tub or shower regularly.  Attach bath mats securely with double-sided non-slip rug tape.  Do not have throw rugs and other things on the floor that can make you trip. What can I do in the bedroom?  Use night lights.  Make sure that you have a light by your bed that is easy to reach.  Do not use any sheets or blankets that are too big for your bed. They should not hang down onto the floor.  Have a firm chair that has side arms. You can use this for support while you get dressed.  Do not have throw rugs and other things on the floor that can make you trip. What can I do in the kitchen?  Clean up any spills right away.  Avoid walking on wet floors.  Keep items that you use a lot in easy-to-reach places.  If you need to reach something above you, use a strong step stool that has a grab bar.  Keep electrical cords out of the way.  Do not use floor polish or wax that makes floors slippery. If you must use wax, use non-skid floor wax.  Do not have throw rugs and other things on the floor that can make you trip. What can I do with my stairs?  Do not leave any items on the stairs.  Make sure that there are handrails on both sides of the stairs and use them. Fix handrails that are broken or loose. Make sure that handrails are as long as the stairways.  Check any carpeting to make sure that it is firmly attached to the stairs. Fix any carpet that is loose or worn.  Avoid having throw rugs at the top or bottom of the stairs. If you do have throw rugs, attach them to the floor with carpet tape.  Make sure that you have a light switch at the top of the stairs and the  bottom of the stairs. If you do not have them, ask someone to add them for you. What else can I do to help prevent falls?  Wear shoes that:  Do not have high heels.  Have rubber bottoms.  Are comfortable and fit you well.  Are closed at the toe. Do not wear sandals.  If you use a stepladder:  Make sure that it is fully opened. Do not climb a closed stepladder.  Make sure that both sides of the stepladder are locked into place.  Ask someone to hold it for you, if possible.  Clearly mark and make sure that you can see:  Any grab bars or handrails.  First and last steps.  Where the edge of each step is.  Use tools that help you move around (mobility aids) if they are needed. These include:  Canes.  Walkers.  Scooters.  Crutches.  Turn on the lights when you go into a dark area. Replace any light bulbs as soon as they burn out.  Set up your furniture so you have a clear path. Avoid moving your furniture around.  If any of your floors are uneven, fix them.  If there are any pets around you, be aware of where they are.  Review your medicines with your doctor. Some medicines can make you feel dizzy. This can increase your chance of falling. Ask your doctor what other things that you can do to help prevent falls. This information is not intended to replace advice given to you by your health care provider. Make sure you discuss any questions you have with your health care provider. Document Released: 05/11/2009 Document Revised: 12/21/2015 Document Reviewed: 08/19/2014 Elsevier Interactive Patient Education  2017 [employer].

## 2020-12-01 NOTE — Progress Notes (Signed)
Subjective:   Cynthia Leon is a 70 y.o. female who presents for Medicare Annual (Subsequent) preventive examination.  Virtual Visit via Telephone Note  I connected with  Cynthia Leon on 12/01/20 at  9:45 AM EDT by telephone and verified that I am speaking with the correct person using two identifiers.  Location: Patient: Home Provider: [facility] Persons participating in the virtual visit: patient/Nurse Health Advisor   I discussed the limitations, risks, security and privacy concerns of performing an evaluation and management service by telephone and the availability of in person appointments. The patient expressed understanding and agreed to proceed.  Interactive audio and video telecommunications were attempted between this nurse and patient, however failed, due to patient having technical difficulties OR patient did not have access to video capability.  We continued and completed visit with audio only.  Some vital signs may be absent or patient reported.   Jakavion Bilodeau E Shanel Prazak, LPN   Review of Systems     Cardiac Risk Factors include: advanced age (>68men, >57 women);sedentary lifestyle;obesity (BMI >30kg/m2);dyslipidemia     Objective:    Today's Vitals   12/01/20 0931  Weight: 210 lb (95.3 kg)  Height: 5\' 6"  (1.676 m)   Body mass index is 33.89 kg/m.  Advanced Directives 12/01/2020 12/01/2019 11/30/2018 11/26/2017 11/25/2016 12/13/2015  Does Patient Have a Medical Advance Directive? Yes Yes No No No No  Type of Paramedic of [place];Living will [place]  Does patient want to make changes to medical advance directive? - No - Patient declined - - - -  Copy of [place] in Chart? No - copy requested - - - - -  Would patient like information on creating a medical advance directive? - - Yes (MAU/Ambulatory/Procedural Areas - Information given) No - Patient declined Yes (MAU/Ambulatory/Procedural Areas -  Information given) -    Current Medications (verified) Outpatient Encounter Medications as of 12/01/2020  Medication Sig  . ALPRAZolam (XANAX) 0.5 MG tablet Take 1 tablet (0.5 mg total) by mouth at bedtime as needed for anxiety.  . benzonatate (TESSALON PERLES) 100 MG capsule Take 1 capsule (100 mg total) by mouth 3 (three) times daily as needed for cough.  . Calcium & Magnesium Carbonates (MYLANTA PO) Take by mouth as needed.  . cephALEXin (KEFLEX) 500 MG capsule Take 1 capsule (500 mg total) by mouth 2 (two) times daily.  . cetirizine (ZYRTEC) 10 MG tablet Take 1 tablet (10 mg total) by mouth daily.  . cyclobenzaprine (FLEXERIL) 10 MG tablet TAKE 1 TABLET BY MOUTH THREE TIMES A DAY AS NEEDED FOR MUSCLE SPASMS  . DULoxetine (CYMBALTA) 60 MG capsule Take 1 capsule (60 mg total) by mouth daily. (NEEDS TO BE SEEN BEFORE NEXT REFILL)  . Fexofenadine HCl (MUCINEX ALLERGY PO) Take by mouth.  . fluticasone (FLONASE) 50 MCG/ACT nasal spray USE 2 SPRAYS IN EACH NOSTRIL ONCE DAILY  . Multiple Vitamins-Minerals (MULTIVITAMIN WITH MINERALS) tablet Take 1 tablet by mouth daily.  [place] Bicarbonate (ZEGERID OTC) 20-1100 MG CAPS capsule Take 1 capsule by mouth daily before breakfast.  . rosuvastatin (CRESTOR) 20 MG tablet TAKE 1 TABLET (20 MG TOTAL) DAILY BY MOUTH.  . topiramate (TOPAMAX) 50 MG tablet TAKE 1 TABLET BY MOUTH TWICE A DAY  . Vitamin D, Ergocalciferol, (DRISDOL) 1.25 MG (50000 UNIT) CAPS capsule TAKE 1 CAPSULE (50,000 UNITS TOTAL) BY MOUTH EVERY 7 (SEVEN) DAYS.   No facility-administered encounter medications on file as  of 12/01/2020.    Allergies (verified) Mobic [meloxicam] and Asa [aspirin]   History: Past Medical History:  Diagnosis Date  . Abdominal pain   . Allergic rhinitis   . Anxiety   . Constipation   . Fibromyalgia   . GERD (gastroesophageal reflux disease)   . Head ache   . Hyperlipidemia   . Osteopenia   . PUD (peptic ulcer disease)   . Right hip pain   .  Shingles    Past Surgical History:  Procedure Laterality Date  . COLONOSCOPY N/A 12/13/2015   Procedure: COLONOSCOPY;  Surgeon: Rogene Houston, MD;  Location: [facility];  Service: Endoscopy;  Laterality: N/A;  1030  . TOTAL HIP ARTHROPLASTY Right    Family History  Problem Relation Age of Onset  . Breast cancer Mother   . Coronary artery disease Mother   . Cancer Mother        breast  . Emphysema Father   . COPD Father   . Hypertension Father   . Heart disease Father   . Diabetes Sister   . Cancer Sister        throat  . Coronary artery disease Sister   . Diabetes Sister   . Esophageal cancer Sister   . Cancer Brother        brain  . Cancer Brother        bone  . Cancer Brother        lung  . Cancer Brother   . Lupus Brother   . Healthy Daughter   . Healthy Daughter   . Healthy Daughter   . Healthy Daughter    Social History   Socioeconomic History  . Marital status: Married    Spouse name: Not on file  . Number of children: 3  . Years of education: 22  . Highest education level: 11th grade  Occupational History  . Occupation: disabled   Tobacco Use  . Smoking status: Passive Smoke Exposure - Never Smoker  . Smokeless tobacco: Never Used  Vaping Use  . Vaping Use: Never used  Substance and Sexual Activity  . Alcohol use: No  . Drug use: No  . Sexual activity: Yes  Other Topics Concern  . Not on file  Social History Narrative   Lives with her husband, their 3 daughters all live nearby   Social Determinants of Health   Financial Resource Strain: Low Risk   . Difficulty of Paying Living Expenses: Not hard at all  Food Insecurity: No Food Insecurity  . Worried About Charity fundraiser in the Last Year: Never true  . Ran Out of Food in the Last Year: Never true  Transportation Needs: No Transportation Needs  . Lack of Transportation (Medical): No  . Lack of Transportation (Non-Medical): No  Physical Activity: Insufficiently Active  . Days of  Exercise per Week: 7 days  . Minutes of Exercise per Session: 10 min  Stress: No Stress Concern Present  . Feeling of Stress : Only a little  Social Connections: Moderately Isolated  . Frequency of Communication with Friends and Family: More than three times a week  . Frequency of Social Gatherings with Friends and Family: More than three times a week  . Attends Religious Services: Never  . Active Member of Clubs or Organizations: No  . Attends Archivist Meetings: Never  . Marital Status: Married    Tobacco Counseling Counseling given: Not Answered   Clinical Intake:  Pre-visit preparation completed:  Yes  Pain : No/denies pain     BMI - recorded: 33.89 Nutritional Status: BMI > 30  Obese Nutritional Risks: None Diabetes: Yes CBG done?: No Did pt. bring in CBG monitor from home?: No  How often do you need to have someone help you when you read instructions, pamphlets, or other written materials from your doctor or pharmacy?: 1 - Never  Diabetic? no  Interpreter Needed?: No  Information entered by :: Tana Trefry, LPN   Activities of Daily Living In your present state of health, do you have any difficulty performing the following activities: 12/01/2020  Hearing? N  Vision? N  Difficulty concentrating or making decisions? Y  Walking or climbing stairs? N  Dressing or bathing? N  Doing errands, shopping? N  Preparing Food and eating ? N  Using the Toilet? N  In the past six months, have you accidently leaked urine? N  Do you have problems with loss of bowel control? N  Managing your Medications? N  Managing your Finances? N  Housekeeping or managing your Housekeeping? N  Some recent data might be hidden    Patient Care Team: [name], FNP as PCP - General (Family Medicine) Ralene Muskrat as Physician Assistant (Chiropractic Medicine)  Indicate any recent Medical Services you may have received from other than [facility] providers in the past  year (date may be approximate).     Assessment:   This is a routine wellness examination for Cynthia Leon.  Hearing/Vision screen  Hearing Screening   125Hz  250Hz  500Hz  1000Hz  2000Hz  3000Hz  4000Hz  6000Hz  8000Hz   Right ear:           Left ear:           Comments: Denies hearing difficulties  Vision Screening Comments: Annual eye exams with [facility] in [place] - up to date with eye exam - wears eyeglasses.  Dietary issues and exercise activities discussed: Current Exercise Habits: Home exercise routine, Type of exercise: walking, Time (Minutes): 10, Frequency (Times/Week): 7, Weekly Exercise (Minutes/Week): 70, Intensity: Mild, Exercise limited by: Other - see comments (fibromyalgia)  Goals Addressed            This Visit's Progress   . Exercise 150 min/wk Moderate Activity   Not on track    Walking is a great option.    . Obtain Annual Eye (retinal)  Exam    On track     Depression Screen PHQ 2/9 Scores 12/01/2020 10/04/2020 04/06/2020 12/01/2019 10/14/2019 04/01/2019 11/30/2018  PHQ - 2 Score 0 0 0 0 0 0 0  PHQ- 9 Score - - - - - - -    Fall Risk Fall Risk  12/01/2020 10/04/2020 04/06/2020 12/01/2019 10/14/2019  Falls in the past year? 0 0 0 0 0  Comment - - - - -  Number falls in past yr: 0 - - - -  Injury with Fall? 0 - - - -  Risk Factor Category  - - - - -  Risk for fall due to : No Fall Risks - - - -  Follow up Falls prevention discussed - - - -    FALL RISK PREVENTION PERTAINING TO THE HOME:  Any stairs in or around the home? Yes  If so, are there any without handrails? No  Home free of loose throw rugs in walkways, pet beds, electrical cords, etc? Yes  Adequate lighting in your home to reduce risk of falls? Yes   ASSISTIVE DEVICES UTILIZED TO PREVENT FALLS:  Life  alert? No  Use of a cane, walker or w/c? No  Grab bars in the bathroom? Yes  Shower chair or bench in shower? No  Elevated toilet seat or a handicapped toilet? No   TIMED UP AND GO:  Was the test performed? No .  Telephonic visit  Cognitive Function:Normal cognitive status assessed by direct observation by this Nurse Health Advisor. No abnormalities found.   MMSE - Mini Mental State Exam 11/26/2017 11/25/2016  Orientation to time 5 5  Orientation to Place 5 5  Registration 3 2  Registration-comments - HAD TO REPEAT FOR PATIENT  Attention/ Calculation 5 5  Recall 2 3  Language- name 2 objects 2 2  Language- repeat 1 1  Language- follow 3 step command 3 3  Language- read & follow direction 1 1  Write a sentence 1 1  Copy design 1 1  Total score 29 29     6CIT Screen 12/01/2019 11/30/2018  What Year? 0 points 0 points  What month? 0 points 0 points  What time? 0 points 0 points  Count back from 20 0 points 0 points  Months in reverse 0 points 0 points  Repeat phrase 4 points 4 points  Total Score 4 4    Immunizations Immunization History  Administered Date(s) Administered  . Pneumococcal Conjugate-13 11/09/2015  . Pneumococcal Polysaccharide-23 11/25/2016  . Td 11/02/2008, 11/25/2018    TDAP status: Up to date  Flu Vaccine status: Declined, Education has been provided regarding the importance of this vaccine but patient still declined. Advised may receive this vaccine at local pharmacy or Health Dept. Aware to provide a copy of the vaccination record if obtained from local pharmacy or Health Dept. Verbalized acceptance and understanding.  Pneumococcal vaccine status: Completed during today's visit.  Covid-19 vaccine status: Declined, Education has been provided regarding the importance of this vaccine but patient still declined. Advised may receive this vaccine at local pharmacy or Health Dept.or vaccine clinic. Aware to provide a copy of the vaccination record if obtained from local pharmacy or Health Dept. Verbalized acceptance and understanding.  Qualifies for Shingles Vaccine? Yes   Zostavax completed No   Shingrix Completed?: No.    Education has been provided regarding the  importance of this vaccine. Patient has been advised to call insurance company to determine out of pocket expense if they have not yet received this vaccine. Advised may also receive vaccine at local pharmacy or Health Dept. Verbalized acceptance and understanding.  Screening Tests Health Maintenance  Topic Date Due  . COVID-19 Vaccine (1) Never done  . FOOT EXAM  09/29/2020  . HEMOGLOBIN A1C  10/04/2020  . DEXA SCAN  11/08/2020  . COLONOSCOPY (Pts 45-62yrs Insurance coverage will need to be confirmed)  12/12/2020  . INFLUENZA VACCINE  02/26/2021  . OPHTHALMOLOGY EXAM  03/21/2021  . URINE MICROALBUMIN  04/06/2021  . MAMMOGRAM  11/01/2021  . TETANUS/TDAP  11/24/2028  . Hepatitis C Screening  Completed  . PNA vac Low Risk Adult  Completed  . HPV VACCINES  Aged Out    Health Maintenance  Health Maintenance Due  Topic Date Due  . COVID-19 Vaccine (1) Never done  . FOOT EXAM  09/29/2020  . HEMOGLOBIN A1C  10/04/2020  . DEXA SCAN  11/08/2020    Colorectal cancer screening: Type of screening: Colonoscopy. Completed 12/13/2015. Repeat every 5 years  Mammogram status: Ordered 12/01/20. Pt provided with contact info and advised to call to schedule appt.   Bone Density status:  Completed 11/09/2015. Results reflect: Bone density results: NORMAL. Repeat every 5 years.  Lung Cancer Screening: (Low Dose CT Chest recommended if Age 64-80 years, 30 pack-year currently smoking OR have quit w/in 15years.) does not qualify.   Additional Screening:  Hepatitis C Screening: does not qualify  Vision Screening: Recommended annual ophthalmology exams for early detection of glaucoma and other disorders of the eye. Is the patient up to date with their annual eye exam?  Yes  Who is the provider or what is the name of the office in which the patient attends annual eye exams? [facility] If pt is not established with a provider, would they like to be referred to a provider to establish care? No .    Dental Screening: Recommended annual dental exams for proper oral hygiene  Community Resource Referral / Chronic Care Management: CRR required this visit?  No   CCM required this visit?  No      Plan:     I have personally reviewed and noted the following in the patient's chart:   . Medical and social history . Use of alcohol, tobacco or illicit drugs  . Current medications and supplements including opioid prescriptions.  . Functional ability and status . Nutritional status . Physical activity . Advanced directives . List of other physicians . Hospitalizations, surgeries, and ER visits in previous 12 months . Vitals . Screenings to include cognitive, depression, and falls . Referrals and appointments  In addition, I have reviewed and discussed with patient certain preventive protocols, quality metrics, and best practice recommendations. A written personalized care plan for preventive services as well as general preventive health recommendations were provided to patient.     Sandrea Hammond, LPN   [id number]   Nurse Notes: Her husband is in the hospital right now. When he is discharged and better, she will make a routine appt with [facility].

## 2020-12-10 ENCOUNTER — Other Ambulatory Visit: Payer: Self-pay | Admitting: Family

## 2020-12-10 DIAGNOSIS — F411 Generalized anxiety disorder: Secondary | ICD-10-CM

## 2020-12-10 DIAGNOSIS — M797 Fibromyalgia: Secondary | ICD-10-CM

## 2020-12-19 ENCOUNTER — Other Ambulatory Visit: Payer: Self-pay | Admitting: Family

## 2020-12-19 DIAGNOSIS — E785 Hyperlipidemia, unspecified: Secondary | ICD-10-CM

## 2020-12-20 ENCOUNTER — Other Ambulatory Visit: Payer: Self-pay | Admitting: Family

## 2020-12-20 DIAGNOSIS — M797 Fibromyalgia: Secondary | ICD-10-CM

## 2021-01-04 ENCOUNTER — Other Ambulatory Visit: Payer: Self-pay

## 2021-01-04 ENCOUNTER — Ambulatory Visit (INDEPENDENT_AMBULATORY_CARE_PROVIDER_SITE_OTHER): Payer: Medicare HMO

## 2021-01-04 ENCOUNTER — Encounter: Payer: Self-pay | Admitting: Nurse Practitioner

## 2021-01-04 ENCOUNTER — Ambulatory Visit (INDEPENDENT_AMBULATORY_CARE_PROVIDER_SITE_OTHER): Payer: Medicare HMO | Admitting: Nurse Practitioner

## 2021-01-04 ENCOUNTER — Ambulatory Visit
Admission: RE | Admit: 2021-01-04 | Discharge: 2021-01-04 | Disposition: A | Discharge status: Home / Self Care | Payer: Medicare HMO | Source: Ambulatory Visit | Attending: Family | Admitting: Family

## 2021-01-04 VITALS — BP 114/72 | HR 92 | Temp 97.8°F | Resp 18

## 2021-01-04 DIAGNOSIS — M25562 Pain in left knee: Secondary | ICD-10-CM

## 2021-01-04 DIAGNOSIS — Z1231 Encounter for screening mammogram for malignant neoplasm of breast: Secondary | ICD-10-CM

## 2021-01-04 DIAGNOSIS — Z780 Asymptomatic menopausal state: Secondary | ICD-10-CM | POA: Diagnosis not present

## 2021-01-04 DIAGNOSIS — M25512 Pain in left shoulder: Secondary | ICD-10-CM | POA: Diagnosis not present

## 2021-01-04 DIAGNOSIS — M25511 Pain in right shoulder: Secondary | ICD-10-CM

## 2021-01-04 MED ORDER — PREDNISONE 10 MG (21) PO TBPK
ORAL_TABLET | ORAL | 0 refills | Status: DC
Start: 2021-01-04 — End: 2021-01-18

## 2021-01-04 NOTE — Patient Instructions (Signed)
Acute Knee Pain, Adult Many things can cause knee pain. Sometimes, knee pain is sudden (acute) and may be caused by damage, swelling, or irritation of the muscles and tissues that support your knee. The pain often goes away on its own with time and rest. If the pain does not go away, tests may be done to find out what is causing the pain. Follow these instructions at home: If you have a knee sleeve or brace:  Wear the knee sleeve or brace as told by your doctor. Take it off only as told by your doctor.  Loosen it if your toes: ? Tingle. ? Become numb. ? Turn cold and blue.  Keep it clean.  If the knee sleeve or brace is not waterproof: ? Do not let it get wet. ? Cover it with a watertight covering when you take a bath or shower.   Activity  Rest your knee.  Do not do things that cause pain or make pain worse.  Avoid activities where both feet leave the ground at the same time (high-impact activities). Examples are running, jumping rope, and doing jumping jacks.  Work with a physical therapist to make a safe exercise program, as told by your doctor. Managing pain, stiffness, and swelling  If told, put ice on the knee. To do this: ? If you have a removable knee sleeve or brace, take it off as told by your doctor. ? Put ice in a plastic bag. ? Place a towel between your skin and the bag. ? Leave the ice on for 20 minutes, 2-3 times a day. ? Take off the ice if your skin turns bright red. This is very important. If you cannot feel pain, heat, or cold, you have a greater risk of damage to the area.  If told, use an elastic bandage to put pressure (compression) on your injured knee.  Raise your knee above the level of your heart while you are sitting or lying down.  Sleep with a pillow under your knee.   General instructions  Take over-the-counter and prescription medicines only as told by your doctor.  Do not smoke or use any products that contain nicotine or tobacco. If you  need help quitting, ask your doctor.  If you are overweight, work with your doctor and a food expert (dietitian) to set goals to lose weight. Being overweight can make your knee hurt more.  Watch for any changes in your symptoms.  Keep all follow-up visits. Contact a doctor if:  The knee pain does not stop.  The knee pain changes or gets worse.  You have a fever along with knee pain.  Your knee is red or feels warm when you touch it.  Your knee gives out or locks up. Get help right away if:  Your knee swells, and the swelling gets worse.  You cannot move your knee.  You have very bad knee pain that does not get better with pain medicine. Summary  Many things can cause knee pain. The pain often goes away on its own with time and rest.  Your doctor may do tests to find out the cause of the pain.  Watch for any changes in your symptoms. Relieve your pain with rest, medicines, light activity, and use of ice.  Get help right away if you cannot move your knee or your knee pain is very bad. This information is not intended to replace advice given to you by your health care provider. Make sure you discuss   any questions you have with your health care provider. Document Revised: 12/29/2019 Document Reviewed: 12/29/2019 Elsevier Patient Education  2021 Elsevier Inc.  

## 2021-01-04 NOTE — Progress Notes (Signed)
   Subjective:    Patient ID: Cynthia Leon, female    DOB: July 20, 1951, 70 y.o.   MRN: [id number]   Chief Complaint: Knee Pain (Left knee pain x 1-2 weeks. No known injury. Patient states that it is "burning" pain that is 9 out of 10. ) and Shoulder Pain (Bilat shoulder pain x 1 month with numbness and tingling down arms into hands at times. Patient has history of chronic neck pain. )   HPI Patient comes in with 2 complaints: - knee pain- she has been having left knee pain for 1-2 weeks. Has some swelling. Painful to walk on. Rates pain 9/10. Walking and standing increases pain. She has tried arthritis strength tylenol and OTC creams with no help. -Shoulder pain- both shoulders are sore to touch. Rate [place] 7/10. Left shoulders up increases pain. This has been going on for awhile.     Review of Systems  Musculoskeletal:  Positive for arthralgias (shoulder pain bil and left knee pain).  All other systems reviewed and are negative.     Objective:   Physical Exam Vitals reviewed.  Constitutional:      Appearance: Normal appearance. She is obese.  Cardiovascular:     Rate and Rhythm: Normal rate and regular rhythm.  Pulmonary:     Breath sounds: Normal breath sounds.  Musculoskeletal:     Comments: FROM of bil shoulders with pain on abduction and external rotation Grips equal bil FROM  of left knee with crepitus- no effusion, no patella tenderness, all ligaments intact  Skin:    General: Skin is warm.  Neurological:     General: No focal deficit present.     Mental Status: She is alert and oriented to person, place, and time.  Psychiatric:        Mood and Affect: Mood normal.        Behavior: Behavior normal.    BP 114/72   Pulse 92   Temp 97.8 F (36.6 C)   Resp 18   SpO2 96%   Left knee xray- mild degenerative changes-Preliminary reading by Ronnald Collum, FNP  [facility]       Assessment & Plan:  Cynthia Leon in today with chief complaint of Knee Pain  (Left knee pain x 1-2 weeks. No known injury. Patient states that it is "burning" pain that is 9 out of 10. ) and Shoulder Pain (Bilat shoulder pain x 1 month with numbness and tingling down arms into hands at times. Patient has history of chronic neck pain. )   1. Acute pain of left knee - DG Knee 1-2 Views Left; Future - predniSONE (STERAPRED UNI-PAK 21 TAB) 10 MG (21) TBPK tablet; As directed x 6 days  Dispense: 21 tablet; Refill: 0  2. Acute pain of both shoulders - predniSONE (STERAPRED UNI-PAK 21 TAB) 10 MG (21) TBPK tablet; As directed x 6 days  Dispense: 21 tablet; Refill: 0  Moist heat to all joints Rest If no better will need to see ortho  The above assessment and management plan was discussed with the patient. The patient verbalized understanding of and has agreed to the management plan. Patient is aware to call the clinic if symptoms persist or worsen. Patient is aware when to return to the clinic for a follow-up visit. Patient educated on when it is appropriate to go to the emergency department.   Mary-Margaret Hassell Done, FNP

## 2021-01-05 ENCOUNTER — Ambulatory Visit: Payer: Medicare HMO | Admitting: Nurse Practitioner

## 2021-01-05 DIAGNOSIS — M85852 Other specified disorders of bone density and structure, left thigh: Secondary | ICD-10-CM | POA: Diagnosis not present

## 2021-01-05 DIAGNOSIS — M85832 Other specified disorders of bone density and structure, left forearm: Secondary | ICD-10-CM | POA: Diagnosis not present

## 2021-01-12 ENCOUNTER — Other Ambulatory Visit: Payer: Self-pay | Admitting: Family

## 2021-01-12 DIAGNOSIS — Z79899 Other long term (current) drug therapy: Secondary | ICD-10-CM

## 2021-01-12 DIAGNOSIS — F411 Generalized anxiety disorder: Secondary | ICD-10-CM

## 2021-01-12 DIAGNOSIS — F1320 Sedative, hypnotic or anxiolytic dependence, uncomplicated: Secondary | ICD-10-CM

## 2021-01-12 NOTE — Telephone Encounter (Signed)
Xanax denied today   Must have appt with PCP - [facility] for further refills   Has been over 6 mos since seen by her.

## 2021-01-15 NOTE — Telephone Encounter (Signed)
Pt called to schedule an appt to see Christy ASAP for med refill because she is completely out of her Xanax Rx. Explained to pt that right now, the best I could offer her for an appt would be next week. Pt says she needs to be seen before that. Says she has been calling since Thursday trying to get an appt to get refills and no one has called her back.  Wants to know if she can be worked in today or tomorrow?  Please advise and call patient.

## 2021-01-16 ENCOUNTER — Telehealth: Payer: Self-pay | Admitting: Family

## 2021-01-16 NOTE — Telephone Encounter (Signed)
Pt's daughter calling about meds for pt. Pt stated she has called multiple times with no response and has been out of meds. She needs an appt with Alyse Low but wants to wait to talk to a nurse about getting in sooner than the first open appt with [facility]

## 2021-01-16 NOTE — Telephone Encounter (Signed)
Pt scheduled with Christy 01/18/21 at 9:10 for medication refills.

## 2021-01-18 ENCOUNTER — Ambulatory Visit (INDEPENDENT_AMBULATORY_CARE_PROVIDER_SITE_OTHER): Payer: Medicare HMO | Admitting: Family

## 2021-01-18 ENCOUNTER — Encounter: Payer: Self-pay | Admitting: Family

## 2021-01-18 ENCOUNTER — Other Ambulatory Visit: Payer: Self-pay

## 2021-01-18 ENCOUNTER — Other Ambulatory Visit: Payer: Self-pay | Admitting: Family

## 2021-01-18 VITALS — BP 111/66 | HR 81 | Temp 98.4°F | Ht 66.0 in | Wt 223.2 lb

## 2021-01-18 DIAGNOSIS — M797 Fibromyalgia: Secondary | ICD-10-CM

## 2021-01-18 DIAGNOSIS — F1320 Sedative, hypnotic or anxiolytic dependence, uncomplicated: Secondary | ICD-10-CM | POA: Diagnosis not present

## 2021-01-18 DIAGNOSIS — G43009 Migraine without aura, not intractable, without status migrainosus: Secondary | ICD-10-CM | POA: Diagnosis not present

## 2021-01-18 DIAGNOSIS — E559 Vitamin D deficiency, unspecified: Secondary | ICD-10-CM

## 2021-01-18 DIAGNOSIS — E1169 Type 2 diabetes mellitus with other specified complication: Secondary | ICD-10-CM

## 2021-01-18 DIAGNOSIS — F411 Generalized anxiety disorder: Secondary | ICD-10-CM

## 2021-01-18 DIAGNOSIS — E785 Hyperlipidemia, unspecified: Secondary | ICD-10-CM | POA: Diagnosis not present

## 2021-01-18 DIAGNOSIS — Z79899 Other long term (current) drug therapy: Secondary | ICD-10-CM

## 2021-01-18 DIAGNOSIS — K5900 Constipation, unspecified: Secondary | ICD-10-CM | POA: Diagnosis not present

## 2021-01-18 DIAGNOSIS — R69 Illness, unspecified: Secondary | ICD-10-CM | POA: Diagnosis not present

## 2021-01-18 DIAGNOSIS — K219 Gastro-esophageal reflux disease without esophagitis: Secondary | ICD-10-CM | POA: Diagnosis not present

## 2021-01-18 DIAGNOSIS — M8949 Other hypertrophic osteoarthropathy, multiple sites: Secondary | ICD-10-CM

## 2021-01-18 DIAGNOSIS — M159 Polyosteoarthritis, unspecified: Secondary | ICD-10-CM

## 2021-01-18 DIAGNOSIS — Z1211 Encounter for screening for malignant neoplasm of colon: Secondary | ICD-10-CM

## 2021-01-18 LAB — CMP14+EGFR
ALT: 14 IU/L (ref 0–32)
AST: 10 IU/L (ref 0–40)
Albumin/Globulin Ratio: 2 (ref 1.2–2.2)
Albumin: 4.6 g/dL (ref 3.8–4.8)
Alkaline Phosphatase: 88 IU/L (ref 44–121)
BUN/Creatinine Ratio: 10 — ABNORMAL LOW (ref 12–28)
BUN: 8 mg/dL (ref 8–27)
Bilirubin Total: 0.3 mg/dL (ref 0.0–1.2)
CO2: 22 mmol/L (ref 20–29)
Calcium: 10 mg/dL (ref 8.7–10.3)
Chloride: 101 mmol/L (ref 96–106)
Creatinine, Ser: 0.83 mg/dL (ref 0.57–1.00)
Globulin, Total: 2.3 g/dL (ref 1.5–4.5)
Glucose: 114 mg/dL — ABNORMAL HIGH (ref 65–99)
Potassium: 4.7 mmol/L (ref 3.5–5.2)
Sodium: 141 mmol/L (ref 134–144)
Total Protein: 6.9 g/dL (ref 6.0–8.5)
eGFR: 76 mL/min/{1.73_m2} (ref >59)

## 2021-01-18 LAB — CBC WITH DIFFERENTIAL/PLATELET
Basophils Absolute: 0.1 10*3/uL (ref 0.0–0.2)
Basos: 1 %
EOS (ABSOLUTE): 0.4 10*3/uL (ref 0.0–0.4)
Eos: 5 %
Hematocrit: 43 % (ref 34.0–46.6)
Hemoglobin: 14.4 g/dL (ref 11.1–15.9)
Immature Grans (Abs): 0 10*3/uL (ref 0.0–0.1)
Immature Granulocytes: 0 %
Lymphocytes Absolute: 2.3 10*3/uL (ref 0.7–3.1)
Lymphs: 26 %
MCH: 28.7 pg (ref 26.6–33.0)
MCHC: 33.5 g/dL (ref 31.5–35.7)
MCV: 86 fL (ref 79–97)
Monocytes Absolute: 0.6 10*3/uL (ref 0.1–0.9)
Monocytes: 7 %
Neutrophils Absolute: 5.5 10*3/uL (ref 1.4–7.0)
Neutrophils: 61 %
Platelets: 342 10*3/uL (ref 150–450)
RBC: 5.02 x10E6/uL (ref 3.77–5.28)
RDW: 12.9 % (ref 11.7–15.4)
WBC: 8.8 10*3/uL (ref 3.4–10.8)

## 2021-01-18 LAB — BAYER DCA HB A1C WAIVED: HB A1C (BAYER DCA - WAIVED): 6 % (ref <7.0)

## 2021-01-18 MED ORDER — ALPRAZOLAM 0.5 MG PO TABS: 0.5000 mg | ORAL_TABLET | Freq: Every evening | ORAL | 2 refills | Status: DC | PRN

## 2021-01-18 NOTE — Progress Notes (Signed)
Subjective:    Patient ID: Cynthia Leon, female    DOB: 1951/03/31, 70 y.o.   MRN: [id number]  Chief Complaint  Patient presents with   Medical Management of Chronic Issues   Pt presents to the office today for chronic follow up. She is currently do PT once a month.  Gastroesophageal Reflux She complains of belching and heartburn. This is a chronic problem. The problem occurs occasionally. The problem has been waxing and waning. Risk factors include obesity. She has tried a PPI for the symptoms. The treatment provided moderate relief.  Diabetes She presents for her follow-up diabetic visit. She has type 2 diabetes mellitus. Hypoglycemia symptoms include nervousness/anxiousness. Pertinent negatives for diabetes include no blurred vision and no foot paresthesias. Symptoms are stable. Pertinent negatives for diabetic complications include no nephropathy or peripheral neuropathy. Risk factors for coronary artery disease include dyslipidemia, diabetes mellitus, hypertension, sedentary lifestyle and post-menopausal. She is following a generally healthy diet. Her overall blood glucose range is 110-130 mg/dl.  Hyperlipidemia This is a chronic problem. The current episode started more than 1 year ago. The problem is controlled. Recent lipid tests were reviewed and are normal. Current antihyperlipidemic treatment includes statins. The current treatment provides moderate improvement of lipids. Risk factors for coronary artery disease include dyslipidemia, hypertension, a sedentary lifestyle and post-menopausal.  Anxiety Presents for follow-up visit. Symptoms include depressed mood, excessive worry, irritability, muscle tension, nervous/anxious behavior and restlessness. Symptoms occur most days. The severity of symptoms is moderate.    Migraine  This is a chronic problem. The current episode started more than 1 year ago. The problem occurs intermittently (weekly). Pertinent negatives include no  blurred vision.  Constipation This is a chronic problem. The current episode started more than 1 year ago. The problem has been resolved since onset. Her stool frequency is 1 time per day. Risk factors include recent dehydration. She has tried enemas for the symptoms. The treatment provided moderate relief.     Review of Systems  Constitutional:  Positive for irritability.  Eyes:  Negative for blurred vision.  Gastrointestinal:  Positive for constipation and heartburn.  Psychiatric/Behavioral:  The patient is nervous/anxious.   All other systems reviewed and are negative.     Objective:   Physical Exam Vitals reviewed.  Constitutional:      General: She is not in acute distress.    Appearance: She is well-developed. She is obese.  HENT:     Head: Normocephalic and atraumatic.     Right Ear: Tympanic membrane normal.     Left Ear: Tympanic membrane normal.  Eyes:     Pupils: Pupils are equal, round, and reactive to light.  Neck:     Thyroid: No thyromegaly.  Cardiovascular:     Rate and Rhythm: Normal rate and regular rhythm.     Heart sounds: Normal heart sounds. No murmur heard. Pulmonary:     Effort: Pulmonary effort is normal. No respiratory distress.     Breath sounds: Normal breath sounds. No wheezing.  Abdominal:     General: Bowel sounds are normal. There is no distension.     Palpations: Abdomen is soft.     Tenderness: There is no abdominal tenderness.  Musculoskeletal:        General: No tenderness. Normal range of motion.     Cervical back: Normal range of motion and neck supple.  Skin:    General: Skin is warm and dry.  Neurological:     Mental Status: She is  alert and oriented to person, place, and time.     Cranial Nerves: No cranial nerve deficit.     Deep Tendon Reflexes: Reflexes are normal and symmetric.  Psychiatric:        Mood and Affect: Mood is anxious.        Behavior: Behavior normal.        Thought Content: Thought content normal.         Judgment: Judgment normal.         BP 111/66   Pulse 81   Temp 98.4 F (36.9 C) (Oral)   Ht _0  (1.676 m)   Wt 223 lb 4 oz (101.3 kg)   BMI 36.03 kg/m   Assessment & Plan:   KARNISHA LEFEBRE comes in today with chief complaint of Medical Management of Chronic Issues   Diagnosis and orders addressed:  1. Gastroesophageal reflux disease, unspecified whether esophagitis present - CMP14+EGFR - CBC with Differential/Platelet  2. Type 2 diabetes mellitus with other specified complication, without long-term current use of insulin (HCC) - Bayer DCA Hb A1c Waived - CMP14+EGFR - CBC with Differential/Platelet  3. Migraine without aura and without status migrainosus, not intractable - CMP14+EGFR - CBC with Differential/Platelet  4. Primary osteoarthritis involving multiple joints - CMP14+EGFR - CBC with Differential/Platelet  5. Constipation, unspecified constipation type - CMP14+EGFR - CBC with Differential/Platelet  6. Controlled substance agreement signed - CMP14+EGFR - CBC with Differential/Platelet - ToxASSURE Select 13 (MW), Urine - ALPRAZolam (XANAX) 0.5 MG tablet; Take 1 tablet (0.5 mg total) by mouth at bedtime as needed for anxiety.  Dispense: 30 tablet; Refill: 2  7. Benzodiazepine dependence (HCC) - CMP14+EGFR - CBC with Differential/Platelet - ToxASSURE Select 13 (MW), Urine - ALPRAZolam (XANAX) 0.5 MG tablet; Take 1 tablet (0.5 mg total) by mouth at bedtime as needed for anxiety.  Dispense: 30 tablet; Refill: 2  8. Fibromyalgia - CMP14+EGFR - CBC with Differential/Platelet  9. Hyperlipidemia, unspecified hyperlipidemia typ - CMP14+EGFR - CBC with Differential/Platelet  10. GAD (generalized anxiety disorder) - CMP14+EGFR - CBC with Differential/Platelet - ALPRAZolam (XANAX) 0.5 MG tablet; Take 1 tablet (0.5 mg total) by mouth at bedtime as needed for anxiety.  Dispense: 30 tablet; Refill: 2  11. Vitamin D deficiency  - CMP14+EGFR - CBC  with Differential/Platelet  12. Morbid obesity ([place]) - CMP14+EGFR - CBC with Differential/Platelet  13. Colon cancer screening - Ambulatory referral to Gastroenterology - CMP14+EGFR - CBC with Differential/Platelet   Labs pending Patient reviewed in [place] controlled database, no flags noted. Contract and drug screen up dated today.  Health Maintenance reviewed Diet and exercise encouraged  Follow up plan: 3 months    Evelina Dun, FNP

## 2021-01-18 NOTE — Patient Instructions (Signed)

## 2021-01-24 LAB — TOXASSURE SELECT 13 (MW), URINE

## 2021-01-24 NOTE — Addendum Note (Signed)
Encounter addended by: Earlene Plater on: 01/24/2021 7:32 AM  Actions taken: Imaging Exam begun

## 2021-01-24 NOTE — Addendum Note (Signed)
Encounter addended by: Earlene Plater on: 01/24/2021 7:29 AM  Actions taken: Imaging Exam ended

## 2021-01-24 NOTE — Addendum Note (Signed)
Encounter addended by: Earlene Plater on: 01/24/2021 7:29 AM  Actions taken: Imaging Exam begun

## 2021-01-24 NOTE — Addendum Note (Signed)
Encounter addended by: Earlene Plater on: 01/24/2021 7:32 AM  Actions taken: Imaging Exam ended

## 2021-02-03 ENCOUNTER — Other Ambulatory Visit: Payer: Self-pay | Admitting: Family

## 2021-02-03 DIAGNOSIS — E785 Hyperlipidemia, unspecified: Secondary | ICD-10-CM

## 2021-03-18 ENCOUNTER — Other Ambulatory Visit: Payer: Self-pay | Admitting: Family

## 2021-04-23 ENCOUNTER — Encounter: Payer: Self-pay | Admitting: Family

## 2021-04-23 ENCOUNTER — Ambulatory Visit (INDEPENDENT_AMBULATORY_CARE_PROVIDER_SITE_OTHER): Payer: Medicare HMO | Admitting: Family

## 2021-04-23 ENCOUNTER — Other Ambulatory Visit: Payer: Self-pay

## 2021-04-23 VITALS — BP 125/77 | HR 98 | Temp 97.0°F | Ht 66.0 in | Wt 226.8 lb

## 2021-04-23 DIAGNOSIS — E559 Vitamin D deficiency, unspecified: Secondary | ICD-10-CM | POA: Diagnosis not present

## 2021-04-23 DIAGNOSIS — E1169 Type 2 diabetes mellitus with other specified complication: Secondary | ICD-10-CM | POA: Diagnosis not present

## 2021-04-23 DIAGNOSIS — M797 Fibromyalgia: Secondary | ICD-10-CM

## 2021-04-23 DIAGNOSIS — E785 Hyperlipidemia, unspecified: Secondary | ICD-10-CM | POA: Diagnosis not present

## 2021-04-23 DIAGNOSIS — K219 Gastro-esophageal reflux disease without esophagitis: Secondary | ICD-10-CM

## 2021-04-23 DIAGNOSIS — M150 Primary generalized (osteo)arthritis: Secondary | ICD-10-CM

## 2021-04-23 DIAGNOSIS — R69 Illness, unspecified: Secondary | ICD-10-CM | POA: Diagnosis not present

## 2021-04-23 DIAGNOSIS — F1320 Sedative, hypnotic or anxiolytic dependence, uncomplicated: Secondary | ICD-10-CM | POA: Diagnosis not present

## 2021-04-23 DIAGNOSIS — M8949 Other hypertrophic osteoarthropathy, multiple sites: Secondary | ICD-10-CM | POA: Diagnosis not present

## 2021-04-23 DIAGNOSIS — F411 Generalized anxiety disorder: Secondary | ICD-10-CM | POA: Diagnosis not present

## 2021-04-23 DIAGNOSIS — M159 Polyosteoarthritis, unspecified: Secondary | ICD-10-CM

## 2021-04-23 DIAGNOSIS — Z79899 Other long term (current) drug therapy: Secondary | ICD-10-CM

## 2021-04-23 DIAGNOSIS — K5900 Constipation, unspecified: Secondary | ICD-10-CM

## 2021-04-23 DIAGNOSIS — Z1211 Encounter for screening for malignant neoplasm of colon: Secondary | ICD-10-CM

## 2021-04-23 MED ORDER — DULOXETINE HCL 60 MG PO CPEP: 60.0000 mg | ORAL_CAPSULE | Freq: Every day | ORAL | 1 refills | Status: DC

## 2021-04-23 MED ORDER — ALPRAZOLAM 0.5 MG PO TABS: 0.5000 mg | ORAL_TABLET | Freq: Every evening | ORAL | 2 refills | Status: DC | PRN

## 2021-04-23 NOTE — Patient Instructions (Signed)
Health Maintenance After Age 71 After age 71, you are at a higher risk for certain long-term diseases and infections as well as injuries from falls. Falls are a major cause of broken bones and head injuries in people who are older than age 71. Getting regular preventive care can help to keep you healthy and well. Preventive care includes getting regular testing and making lifestyle changes as recommended by your health care provider. Talk with your health care provider about: Which screenings and tests you should have. A screening is a test that checks for a disease when you have no symptoms. A diet and exercise plan that is right for you. What should I know about screenings and tests to prevent falls? Screening and testing are the best ways to find a health problem early. Early diagnosis and treatment give you the best chance of managing medical conditions that are common after age 71. Certain conditions and lifestyle choices may make you more likely to have a fall. Your health care provider may recommend: Regular vision checks. Poor vision and conditions such as cataracts can make you more likely to have a fall. If you wear glasses, make sure to get your prescription updated if your vision changes. Medicine review. Work with your health care provider to regularly review all of the medicines you are taking, including over-the-counter medicines. Ask your health care provider about any side effects that may make you more likely to have a fall. Tell your health care provider if any medicines that you take make you feel dizzy or sleepy. Osteoporosis screening. Osteoporosis is a condition that causes the bones to get weaker. This can make the bones weak and cause them to break more easily. Blood pressure screening. Blood pressure changes and medicines to control blood pressure can make you feel dizzy. Strength and balance checks. Your health care provider may recommend certain tests to check your strength and  balance while standing, walking, or changing positions. Foot health exam. Foot pain and numbness, as well as not wearing proper footwear, can make you more likely to have a fall. Depression screening. You may be more likely to have a fall if you have a fear of falling, feel emotionally low, or feel unable to do activities that you used to do. Alcohol use screening. Using too much alcohol can affect your balance and may make you more likely to have a fall. What actions can I take to lower my risk of falls? General instructions Talk with your health care provider about your risks for falling. Tell your health care provider if: You fall. Be sure to tell your health care provider about all falls, even ones that seem minor. You feel dizzy, sleepy, or off-balance. Take over-the-counter and prescription medicines only as told by your health care provider. These include any supplements. Eat a healthy diet and maintain a healthy weight. A healthy diet includes low-fat dairy products, low-fat (lean) meats, and fiber from whole grains, beans, and lots of fruits and vegetables. Home safety Remove any tripping hazards, such as rugs, cords, and clutter. Install safety equipment such as grab bars in bathrooms and safety rails on stairs. Keep rooms and walkways well-lit. Activity  Follow a regular exercise program to stay fit. This will help you maintain your balance. Ask your health care provider what types of exercise are appropriate for you. If you need a cane or walker, use it as recommended by your health care provider. Wear supportive shoes that have nonskid soles. Lifestyle Do not   drink alcohol if your health care provider tells you not to drink. If you drink alcohol, limit how much you have: 0-1 drink a day for women. 0-2 drinks a day for men. Be aware of how much alcohol is in your drink. In the [place] one drink equals one typical bottle of beer (12 oz), one-half glass of wine (5 oz), or one shot of  hard liquor (1 oz). Do not use any products that contain nicotine or tobacco, such as cigarettes and e-cigarettes. If you need help quitting, ask your health care provider. Summary Having a healthy lifestyle and getting preventive care can help to protect your health and wellness after age 71. Screening and testing are the best way to find a health problem early and help you avoid having a fall. Early diagnosis and treatment give you the best chance for managing medical conditions that are more common for people who are older than age 71. Falls are a major cause of broken bones and head injuries in people who are older than age 71. Take precautions to prevent a fall at home. Work with your health care provider to learn what changes you can make to improve your health and wellness and to prevent falls. This information is not intended to replace advice given to you by your health care provider. Make sure you discuss any questions you have with your health care provider. Document Revised: 09/22/2020 Document Reviewed: 06/30/2020 Elsevier Patient Education  2022 [employer] Inc.  

## 2021-04-23 NOTE — Progress Notes (Signed)
Subjective:    Patient ID: Cynthia Leon, female    DOB: 1950/08/07, 70 y.o.   MRN: [id number]  Chief Complaint  Patient presents with   Medical Management of Chronic Issues   Pt presents to the office today for chronic follow up. Pt is considered moribid obese today with a BMI of 36 and co-moribility of DM.  Gastroesophageal Reflux She complains of belching and heartburn. This is a chronic problem. The current episode started more than 1 year ago. The problem occurs occasionally. Risk factors include obesity. She has tried a PPI for the symptoms. The treatment provided moderate relief.  Diabetes She presents for her follow-up diabetic visit. She has type 2 diabetes mellitus. Hypoglycemia symptoms include nervousness/anxiousness. Pertinent negatives for diabetes include no blurred vision and no foot paresthesias. Symptoms are stable. Diabetic complications include heart disease. Risk factors for coronary artery disease include dyslipidemia, diabetes mellitus, hypertension, sedentary lifestyle and post-menopausal. She is following a generally healthy diet. (Does not check BS at home) An ACE inhibitor/angiotensin II receptor blocker is being taken. Eye exam is current.  Arthritis Presents for follow-up visit. She complains of pain and stiffness. The symptoms have been stable. Affected locations include the right MCP, left MCP, right knee and left knee.  Hyperlipidemia This is a chronic problem. The current episode started more than 1 year ago. The problem is controlled. Exacerbating diseases include obesity. Current antihyperlipidemic treatment includes statins. The current treatment provides moderate improvement of lipids. Risk factors for coronary artery disease include dyslipidemia, a sedentary lifestyle and post-menopausal.  Anxiety Presents for follow-up visit. Symptoms include excessive worry, irritability, nervous/anxious behavior and restlessness. Symptoms occur most days. The  severity of symptoms is moderate.    Constipation This is a chronic problem. The current episode started more than 1 year ago. The problem has been waxing and waning since onset. She has tried diet changes and stool softeners for the symptoms. The treatment provided moderate relief.     Review of Systems  Constitutional:  Positive for irritability.  Eyes:  Negative for blurred vision.  Gastrointestinal:  Positive for constipation and heartburn.  Musculoskeletal:  Positive for arthritis and stiffness.  Psychiatric/Behavioral:  The patient is nervous/anxious.   All other systems reviewed and are negative.     Objective:   Physical Exam Vitals reviewed.  Constitutional:      General: She is not in acute distress.    Appearance: She is well-developed. She is obese.  HENT:     Head: Normocephalic and atraumatic.     Right Ear: Tympanic membrane normal.     Left Ear: Tympanic membrane normal.  Eyes:     Pupils: Pupils are equal, round, and reactive to light.  Neck:     Thyroid: No thyromegaly.  Cardiovascular:     Rate and Rhythm: Normal rate and regular rhythm.     Heart sounds: Normal heart sounds. No murmur heard. Pulmonary:     Effort: Pulmonary effort is normal. No respiratory distress.     Breath sounds: Normal breath sounds. No wheezing.  Abdominal:     General: Bowel sounds are normal. There is no distension.     Palpations: Abdomen is soft.     Tenderness: There is no abdominal tenderness.  Musculoskeletal:        General: No tenderness. Normal range of motion.     Cervical back: Normal range of motion and neck supple.  Skin:    General: Skin is warm and dry.  Neurological:  Mental Status: She is alert and oriented to person, place, and time.     Cranial Nerves: No cranial nerve deficit.     Deep Tendon Reflexes: Reflexes are normal and symmetric.  Psychiatric:        Behavior: Behavior normal.        Thought Content: Thought content normal.        Judgment:  Judgment normal.      BP 125/77   Pulse 98   Temp (!) 97 F (36.1 C) (Temporal)   Ht 5\' 6"  (1.676 m)   Wt 226 lb 12.8 oz (102.9 kg)   BMI 36.61 kg/m      Assessment & Plan:  Cynthia Leon comes in today with chief complaint of Medical Management of Chronic Issues   Diagnosis and orders addressed:  1. Controlled substance agreement signed - ALPRAZolam (XANAX) 0.5 MG tablet; Take 1 tablet (0.5 mg total) by mouth at bedtime as needed for anxiety.  Dispense: 30 tablet; Refill: 2  2. Benzodiazepine dependence (HCC) - ALPRAZolam (XANAX) 0.5 MG tablet; Take 1 tablet (0.5 mg total) by mouth at bedtime as needed for anxiety.  Dispense: 30 tablet; Refill: 2  3. GAD (generalized anxiety disorder) - ALPRAZolam (XANAX) 0.5 MG tablet; Take 1 tablet (0.5 mg total) by mouth at bedtime as needed for anxiety.  Dispense: 30 tablet; Refill: 2 - DULoxetine (CYMBALTA) 60 MG capsule; Take 1 capsule (60 mg total) by mouth daily. (NEEDS TO BE SEEN BEFORE NEXT REFILL)  Dispense: 90 capsule; Refill: 1  4. Fibromyalgia - DULoxetine (CYMBALTA) 60 MG capsule; Take 1 capsule (60 mg total) by mouth daily. (NEEDS TO BE SEEN BEFORE NEXT REFILL)  Dispense: 90 capsule; Refill: 1  5. Type 2 diabetes mellitus with other specified complication, without long-term current use of insulin ([place])  6. Gastroesophageal reflux disease, unspecified whether esophagitis present  7. Primary osteoarthritis involving multiple joints  8. Morbid obesity ([place])  9. Vitamin D deficiency  10. Hyperlipidemia, unspecified hyperlipidemia type  11. Constipation, unspecified constipation type  12. Colon cancer screening - Cologuard   Labs pending Health Maintenance reviewed Diet and exercise encouraged  Follow up plan: 6 months    Evelina Dun, FNP

## 2021-05-02 DIAGNOSIS — Z1211 Encounter for screening for malignant neoplasm of colon: Secondary | ICD-10-CM | POA: Diagnosis not present

## 2021-05-07 LAB — COLOGUARD: Cologuard: NEGATIVE

## 2021-05-24 ENCOUNTER — Other Ambulatory Visit: Payer: Self-pay | Admitting: Family

## 2021-05-24 DIAGNOSIS — M797 Fibromyalgia: Secondary | ICD-10-CM

## 2021-06-17 ENCOUNTER — Other Ambulatory Visit: Payer: Self-pay | Admitting: Family

## 2021-06-25 ENCOUNTER — Other Ambulatory Visit: Payer: Self-pay | Admitting: Family Medicine

## 2021-06-25 ENCOUNTER — Other Ambulatory Visit: Payer: Self-pay | Admitting: Family

## 2021-06-25 DIAGNOSIS — M797 Fibromyalgia: Secondary | ICD-10-CM

## 2021-06-25 DIAGNOSIS — E785 Hyperlipidemia, unspecified: Secondary | ICD-10-CM

## 2021-07-12 DIAGNOSIS — Z0100 Encounter for examination of eyes and vision without abnormal findings: Secondary | ICD-10-CM | POA: Diagnosis not present

## 2021-07-12 DIAGNOSIS — E7800 Pure hypercholesterolemia, unspecified: Secondary | ICD-10-CM | POA: Diagnosis not present

## 2021-07-12 DIAGNOSIS — H5200 Hypermetropia, unspecified eye: Secondary | ICD-10-CM | POA: Diagnosis not present

## 2021-07-12 LAB — HM DIABETES EYE EXAM

## 2021-07-25 ENCOUNTER — Telehealth: Payer: Self-pay | Admitting: *Deleted

## 2021-07-25 NOTE — Telephone Encounter (Signed)
Cyclobenzaprine HCl 10MG  tablets Key: [id number]  Sent to [facility] today

## 2021-07-25 NOTE — Telephone Encounter (Signed)
Approved today Your request has been approved  [facility] aware

## 2021-08-01 ENCOUNTER — Other Ambulatory Visit: Payer: Self-pay | Admitting: Family

## 2021-08-01 DIAGNOSIS — F411 Generalized anxiety disorder: Secondary | ICD-10-CM

## 2021-08-01 DIAGNOSIS — F1320 Sedative, hypnotic or anxiolytic dependence, uncomplicated: Secondary | ICD-10-CM

## 2021-08-01 DIAGNOSIS — M797 Fibromyalgia: Secondary | ICD-10-CM

## 2021-08-01 DIAGNOSIS — Z79899 Other long term (current) drug therapy: Secondary | ICD-10-CM

## 2021-08-09 ENCOUNTER — Encounter: Payer: Self-pay | Admitting: Family

## 2021-08-09 ENCOUNTER — Ambulatory Visit (INDEPENDENT_AMBULATORY_CARE_PROVIDER_SITE_OTHER): Payer: Medicare HMO | Admitting: Family

## 2021-08-09 ENCOUNTER — Telehealth: Payer: Self-pay | Admitting: Family

## 2021-08-09 DIAGNOSIS — F411 Generalized anxiety disorder: Secondary | ICD-10-CM | POA: Diagnosis not present

## 2021-08-09 DIAGNOSIS — Z79899 Other long term (current) drug therapy: Secondary | ICD-10-CM | POA: Diagnosis not present

## 2021-08-09 DIAGNOSIS — R69 Illness, unspecified: Secondary | ICD-10-CM | POA: Diagnosis not present

## 2021-08-09 DIAGNOSIS — F1320 Sedative, hypnotic or anxiolytic dependence, uncomplicated: Secondary | ICD-10-CM | POA: Diagnosis not present

## 2021-08-09 MED ORDER — ALPRAZOLAM 0.5 MG PO TABS: 0.5000 mg | ORAL_TABLET | Freq: Every evening | ORAL | 2 refills | Status: DC | PRN

## 2021-08-09 NOTE — Progress Notes (Signed)
Virtual Visit  Note Due to COVID-19 pandemic this visit was conducted virtually. This visit type was conducted due to national recommendations for restrictions regarding the COVID-19 Pandemic (e.g. social distancing, sheltering in place) in an effort to limit this patient's exposure and mitigate transmission in our community. All issues noted in this document were discussed and addressed.  A physical exam was not performed with this format.  I connected with Cynthia Leon on 08/09/21 at 3:15 pm  by telephone and verified that I am speaking with the correct person using two identifiers. Cynthia Leon is currently located at home and her husband  is currently with her during visit. The provider, Evelina Dun, FNP is located in their office at time of visit.  I discussed the limitations, risks, security and privacy concerns of performing an evaluation and management service by telephone and the availability of in person appointments. I also discussed with the patient that there may be a patient responsible charge related to this service. The patient expressed understanding and agreed to proceed.  Cynthia Leon, boffa are scheduled for a virtual visit with your provider today.    Just as we do with appointments in the office, we must obtain your consent to participate.  Your consent will be active for this visit and any virtual visit you may have with one of our providers in the next 365 days.    If you have a MyChart account, I can also send a copy of this consent to you electronically.  All virtual visits are billed to your insurance company just like a traditional visit in the office.  As this is a virtual visit, video technology does not allow for your provider to perform a traditional examination.  This may limit your provider's ability to fully assess your condition.  If your provider identifies any concerns that need to be evaluated in person or the need to arrange testing such as labs,  EKG, etc, we will make arrangements to do so.    Although advances in technology are sophisticated, we cannot ensure that it will always work on either your end or our end.  If the connection with a video visit is poor, we may have to switch to a telephone visit.  With either a video or telephone visit, we are not always able to ensure that we have a secure connection.   I need to obtain your verbal consent now.   Are you willing to proceed with your visit today?   Cynthia Leon has provided verbal consent on 08/09/2021 for a virtual visit (video or telephone).   Evelina Dun, [place] 08/09/2021  3:16 PM    History and Present Illness:  Pt calls the office today to have xanax refilled. She was seen on 04/23/21 and was given a 6 month supply. However, she reports her pharmacy said it "expired". Her drug screen and contract are up date.  Anxiety Presents for follow-up visit. Symptoms include decreased concentration, excessive worry, irritability, nervous/anxious behavior and restlessness.       Review of Systems  Constitutional:  Positive for irritability.  Psychiatric/Behavioral:  Positive for decreased concentration. The patient is nervous/anxious.     Observations/Objective: No SOB or distress noted   Assessment and Plan: 1. Controlled substance agreement signed - ALPRAZolam (XANAX) 0.5 MG tablet; Take 1 tablet (0.5 mg total) by mouth at bedtime as needed for anxiety.  Dispense: 30 tablet; Refill: 2  2. Benzodiazepine dependence (HCC) - ALPRAZolam (XANAX) 0.5 MG tablet;  Take 1 tablet (0.5 mg total) by mouth at bedtime as needed for anxiety.  Dispense: 30 tablet; Refill: 2  3. GAD (generalized anxiety disorder) - ALPRAZolam (XANAX) 0.5 MG tablet; Take 1 tablet (0.5 mg total) by mouth at bedtime as needed for anxiety.  Dispense: 30 tablet; Refill: 2  Patient reviewed in [place] controlled database, no flags noted. Contract and drug screen are up to date.  Follow up in 3 months     I discussed the assessment and treatment plan with the patient. The patient was provided an opportunity to ask questions and all were answered. The patient agreed with the plan and demonstrated an understanding of the instructions.   The patient was advised to call back or seek an in-person evaluation if the symptoms worsen or if the condition fails to improve as anticipated.  The above assessment and management plan was discussed with the patient. The patient verbalized understanding of and has agreed to the management plan. Patient is aware to call the clinic if symptoms persist or worsen. Patient is aware when to return to the clinic for a follow-up visit. Patient educated on when it is appropriate to go to the emergency department.   Time call ended:  3:26 pm   I provided 11 minutes of  non face-to-face time during this encounter.    Evelina Dun, FNP

## 2021-08-09 NOTE — Telephone Encounter (Signed)
Called patient appt made

## 2021-08-29 ENCOUNTER — Other Ambulatory Visit: Payer: Self-pay | Admitting: Family

## 2021-08-29 DIAGNOSIS — M797 Fibromyalgia: Secondary | ICD-10-CM

## 2021-09-19 DIAGNOSIS — J301 Allergic rhinitis due to pollen: Secondary | ICD-10-CM | POA: Diagnosis not present

## 2021-09-19 DIAGNOSIS — R030 Elevated blood-pressure reading, without diagnosis of hypertension: Secondary | ICD-10-CM | POA: Diagnosis not present

## 2021-09-19 DIAGNOSIS — Z6836 Body mass index (BMI) 36.0-36.9, adult: Secondary | ICD-10-CM | POA: Diagnosis not present

## 2021-09-19 DIAGNOSIS — G43909 Migraine, unspecified, not intractable, without status migrainosus: Secondary | ICD-10-CM | POA: Diagnosis not present

## 2021-09-19 DIAGNOSIS — M1990 Unspecified osteoarthritis, unspecified site: Secondary | ICD-10-CM | POA: Diagnosis not present

## 2021-09-19 DIAGNOSIS — Z79899 Other long term (current) drug therapy: Secondary | ICD-10-CM | POA: Diagnosis not present

## 2021-09-19 DIAGNOSIS — F411 Generalized anxiety disorder: Secondary | ICD-10-CM | POA: Diagnosis not present

## 2021-09-19 DIAGNOSIS — K219 Gastro-esophageal reflux disease without esophagitis: Secondary | ICD-10-CM | POA: Diagnosis not present

## 2021-09-19 DIAGNOSIS — E785 Hyperlipidemia, unspecified: Secondary | ICD-10-CM | POA: Diagnosis not present

## 2021-09-19 DIAGNOSIS — R69 Illness, unspecified: Secondary | ICD-10-CM | POA: Diagnosis not present

## 2021-09-19 DIAGNOSIS — K5900 Constipation, unspecified: Secondary | ICD-10-CM | POA: Diagnosis not present

## 2021-10-22 ENCOUNTER — Ambulatory Visit (INDEPENDENT_AMBULATORY_CARE_PROVIDER_SITE_OTHER): Payer: Medicare HMO | Admitting: Family

## 2021-10-22 ENCOUNTER — Encounter: Payer: Self-pay | Admitting: Family

## 2021-10-22 VITALS — BP 121/75 | HR 94 | Temp 97.2°F | Ht 66.0 in | Wt 223.6 lb

## 2021-10-22 DIAGNOSIS — E785 Hyperlipidemia, unspecified: Secondary | ICD-10-CM | POA: Diagnosis not present

## 2021-10-22 DIAGNOSIS — F411 Generalized anxiety disorder: Secondary | ICD-10-CM | POA: Diagnosis not present

## 2021-10-22 DIAGNOSIS — E1169 Type 2 diabetes mellitus with other specified complication: Secondary | ICD-10-CM | POA: Diagnosis not present

## 2021-10-22 DIAGNOSIS — M159 Polyosteoarthritis, unspecified: Secondary | ICD-10-CM | POA: Diagnosis not present

## 2021-10-22 DIAGNOSIS — R69 Illness, unspecified: Secondary | ICD-10-CM | POA: Diagnosis not present

## 2021-10-22 DIAGNOSIS — F1320 Sedative, hypnotic or anxiolytic dependence, uncomplicated: Secondary | ICD-10-CM

## 2021-10-22 DIAGNOSIS — Z79899 Other long term (current) drug therapy: Secondary | ICD-10-CM | POA: Diagnosis not present

## 2021-10-22 DIAGNOSIS — K219 Gastro-esophageal reflux disease without esophagitis: Secondary | ICD-10-CM | POA: Diagnosis not present

## 2021-10-22 DIAGNOSIS — M150 Primary generalized (osteo)arthritis: Secondary | ICD-10-CM

## 2021-10-22 LAB — CBC WITH DIFFERENTIAL/PLATELET
Basophils Absolute: 0.1 10*3/uL (ref 0.0–0.2)
Basos: 1 %
EOS (ABSOLUTE): 0.4 10*3/uL (ref 0.0–0.4)
Eos: 5 %
Hematocrit: 44 % (ref 34.0–46.6)
Hemoglobin: 14.6 g/dL (ref 11.1–15.9)
Immature Grans (Abs): 0 10*3/uL (ref 0.0–0.1)
Immature Granulocytes: 0 %
Lymphocytes Absolute: 1.7 10*3/uL (ref 0.7–3.1)
Lymphs: 23 %
MCH: 29 pg (ref 26.6–33.0)
MCHC: 33.2 g/dL (ref 31.5–35.7)
MCV: 87 fL (ref 79–97)
Monocytes Absolute: 0.5 10*3/uL (ref 0.1–0.9)
Monocytes: 7 %
Neutrophils Absolute: 4.7 10*3/uL (ref 1.4–7.0)
Neutrophils: 64 %
Platelets: 288 10*3/uL (ref 150–450)
RBC: 5.04 x10E6/uL (ref 3.77–5.28)
RDW: 12.9 % (ref 11.7–15.4)
WBC: 7.3 10*3/uL (ref 3.4–10.8)

## 2021-10-22 LAB — CMP14+EGFR
ALT: 13 IU/L (ref 0–32)
AST: 11 IU/L (ref 0–40)
Albumin/Globulin Ratio: 1.9 (ref 1.2–2.2)
Albumin: 4.6 g/dL (ref 3.7–4.7)
Alkaline Phosphatase: 86 IU/L (ref 44–121)
BUN/Creatinine Ratio: 8 — ABNORMAL LOW (ref 12–28)
BUN: 7 mg/dL — ABNORMAL LOW (ref 8–27)
Bilirubin Total: 0.3 mg/dL (ref 0.0–1.2)
CO2: 24 mmol/L (ref 20–29)
Calcium: 9.9 mg/dL (ref 8.7–10.3)
Chloride: 103 mmol/L (ref 96–106)
Creatinine, Ser: 0.86 mg/dL (ref 0.57–1.00)
Globulin, Total: 2.4 g/dL (ref 1.5–4.5)
Glucose: 120 mg/dL — ABNORMAL HIGH (ref 70–99)
Potassium: 4.7 mmol/L (ref 3.5–5.2)
Sodium: 141 mmol/L (ref 134–144)
Total Protein: 7 g/dL (ref 6.0–8.5)
eGFR: 72 mL/min/{1.73_m2} (ref >59)

## 2021-10-22 LAB — BAYER DCA HB A1C WAIVED: HB A1C (BAYER DCA - WAIVED): 5.9 % — ABNORMAL HIGH (ref 4.8–5.6)

## 2021-10-22 MED ORDER — ALPRAZOLAM 0.5 MG PO TABS: 0.5000 mg | ORAL_TABLET | Freq: Every evening | ORAL | 2 refills | Status: DC | PRN

## 2021-10-22 NOTE — Progress Notes (Signed)
? ?Subjective:  ? ? Patient ID: Cynthia Leon, female    DOB: 1951-03-13, 71 y.o.   MRN: [id number] ? ?Chief Complaint  ?Patient presents with  ? Medical Management of Chronic Issues  ? Sinusitis  ? ?Pt presents to the office today for chronic follow up. Pt is morbid obese today with a BMI of 36 and co-moribility of DM and GERD.  ?Sinusitis ?This is a new problem. The current episode started 1 to 4 weeks ago. The problem has been gradually worsening since onset. There has been no fever. The pain is mild. Associated symptoms include congestion, coughing, headaches, a hoarse voice, shortness of breath, sinus pressure and sneezing. Pertinent negatives include no ear pain or sore throat. Past treatments include oral decongestants. The treatment provided mild relief.  ?Gastroesophageal Reflux ?She complains of coughing and a hoarse voice. She reports no sore throat. This is a chronic problem. The current episode started more than 1 year ago. The problem occurs occasionally. The problem has been waxing and waning. The symptoms are aggravated by certain foods. Risk factors include obesity. She has tried a PPI for the symptoms. The treatment provided moderate relief.  ?Diabetes ?She presents for her follow-up diabetic visit. She has type 2 diabetes mellitus. Hypoglycemia symptoms include headaches and nervousness/anxiousness. Pertinent negatives for diabetes include no blurred vision and no foot paresthesias. Symptoms are stable. Pertinent negatives for diabetic complications include no heart disease or peripheral neuropathy. Risk factors for coronary artery disease include dyslipidemia, diabetes mellitus, hypertension, sedentary lifestyle and post-menopausal. She is following a generally unhealthy diet. An ACE inhibitor/angiotensin II receptor blocker is being taken. Eye exam is current.  ?Arthritis ?Presents for follow-up visit. She complains of pain and stiffness. The symptoms have been stable. Affected locations  include the right knee, left knee, left MCP, right MCP, right shoulder and left shoulder. Her pain is at a severity of 8/10. Associated symptoms include dry mouth.  ?Hyperlipidemia ?This is a chronic problem. The current episode started more than 1 year ago. The problem is controlled. Exacerbating diseases include obesity. Associated symptoms include shortness of breath. Current antihyperlipidemic treatment includes statins. The current treatment provides moderate improvement of lipids. Risk factors for coronary artery disease include dyslipidemia, diabetes mellitus, hypertension, a sedentary lifestyle and post-menopausal.  ?Anxiety ?Presents for follow-up visit. Symptoms include decreased concentration, dry mouth, irritability, nervous/anxious behavior, obsessions and shortness of breath. Symptoms occur occasionally. The severity of symptoms is moderate.  ? ? ? ?Current opioids rx- Xanax  0.5 mg ?# meds rx- 30 ?Effectiveness of current meds-stable ?Adverse reactions from pain meds-none ? ?Pill count performed-No ?Last drug screen - 01/18/22 ?( high risk q45m moderate risk q665mlow risk yearly ) ?Urine drug screen today- No ?[place] ? If yes were their any concerning findings? - none ? ?Pain contract signed on: 01/25/21 ? ? ? ?Review of Systems  ?Constitutional:  Positive for irritability.  ?HENT:  Positive for congestion, hoarse voice, sinus pressure and sneezing. Negative for ear pain and sore throat.   ?Eyes:  Negative for blurred vision.  ?Respiratory:  Positive for cough and shortness of breath.   ?Musculoskeletal:  Positive for arthritis and stiffness.  ?Neurological:  Positive for headaches.  ?Psychiatric/Behavioral:  Positive for decreased concentration. The patient is nervous/anxious.   ?All other systems reviewed and are negative. ? ?   ?Objective:  ? Physical Exam ?Vitals reviewed.  ?Constitutional:   ?   General: She is not in acute distress. ?  Appearance: She is well-developed. She  is obese.  ?HENT:  ?   Head: Normocephalic and atraumatic.  ?   Right Ear: Tympanic membrane normal.  ?   Left Ear: Tympanic membrane normal.  ?Eyes:  ?   Pupils: Pupils are equal, round, and reactive to light.  ?Neck:  ?   Thyroid: No thyromegaly.  ?Cardiovascular:  ?   Rate and Rhythm: Normal rate and regular rhythm.  ?   Heart sounds: Normal heart sounds. No murmur heard. ?Pulmonary:  ?   Effort: Pulmonary effort is normal. No respiratory distress.  ?   Breath sounds: Normal breath sounds. No wheezing.  ?Abdominal:  ?   General: Bowel sounds are normal. There is no distension.  ?   Palpations: Abdomen is soft.  ?   Tenderness: There is no abdominal tenderness.  ?Musculoskeletal:     ?   General: No tenderness. Normal range of motion.  ?   Cervical back: Normal range of motion and neck supple.  ?Skin: ?   General: Skin is warm and dry.  ?Neurological:  ?   Mental Status: She is alert and oriented to person, place, and time.  ?   Cranial Nerves: No cranial nerve deficit.  ?   Deep Tendon Reflexes: Reflexes are normal and symmetric.  ?Psychiatric:     ?   Behavior: Behavior normal.     ?   Thought Content: Thought content normal.     ?   Judgment: Judgment normal.  ? ? ? ? ?BP 121/75   Pulse 94   Temp (!) 97.2 ?F (36.2 ?C) (Temporal)   Ht '5\' 6"'  (1.676 m)   Wt 223 lb 9.6 oz (101.4 kg)   BMI 36.09 kg/m?  ? ?   ?Assessment & Plan:  ?VONETTE GROSSO comes in today with chief complaint of Medical Management of Chronic Issues and Sinusitis ? ? ?Diagnosis and orders addressed: ? ?1. Controlled substance agreement signed ?- ALPRAZolam (XANAX) 0.5 MG tablet; Take 1 tablet (0.5 mg total) by mouth at bedtime as needed for anxiety.  Dispense: 30 tablet; Refill: 2 ?- CMP14+EGFR ?- CBC with Differential/Platelet ? ?2. Benzodiazepine dependence ([place]) ?- ALPRAZolam (XANAX) 0.5 MG tablet; Take 1 tablet (0.5 mg total) by mouth at bedtime as needed for anxiety.  Dispense: 30 tablet; Refill: 2 ?- CMP14+EGFR ?- CBC with  Differential/Platelet ? ?3. GAD (generalized anxiety disorder) ?- ALPRAZolam (XANAX) 0.5 MG tablet; Take 1 tablet (0.5 mg total) by mouth at bedtime as needed for anxiety.  Dispense: 30 tablet; Refill: 2 ?- CMP14+EGFR ?- CBC with Differential/Platelet ? ?4. Gastroesophageal reflux disease, unspecified whether esophagitis present ?- CMP14+EGFR ?- CBC with Differential/Platelet ? ?5. Type 2 diabetes mellitus with other specified complication, without long-term current use of insulin ([place]) ?- Bayer DCA Hb A1c Waived ?- CMP14+EGFR ?- CBC with Differential/Platelet ?- Microalbumin / creatinine urine ratio ? ?6. Primary osteoarthritis involving multiple joints ?- CMP14+EGFR ?- CBC with Differential/Platelet ? ?7. Hyperlipidemia, unspecified hyperlipidemia type ?- CMP14+EGFR ?- CBC with Differential/Platelet ? ?8. Morbid obesity ([place]) ?- CMP14+EGFR ?- CBC with Differential/Platelet ? ? ?Labs pending ?Patient reviewed in [place] controlled database, no flags noted. Contract and drug screen are up to date. ?Health Maintenance reviewed ?Diet and exercise encouraged ? ?Follow up plan: ?3 months  ? ? ?Evelina Dun, FNP ? ? ?

## 2021-10-22 NOTE — Patient Instructions (Signed)
Health Maintenance After Age 72 After age 72, you are at a higher risk for certain long-term diseases and infections as well as injuries from falls. Falls are a major cause of broken bones and head injuries in people who are older than age 72. Getting regular preventive care can help to keep you healthy and well. Preventive care includes getting regular testing and making lifestyle changes as recommended by your health care provider. Talk with your health care provider about: Which screenings and tests you should have. A screening is a test that checks for a disease when you have no symptoms. A diet and exercise plan that is right for you. What should I know about screenings and tests to prevent falls? Screening and testing are the best ways to find a health problem early. Early diagnosis and treatment give you the best chance of managing medical conditions that are common after age 72. Certain conditions and lifestyle choices may make you more likely to have a fall. Your health care provider may recommend: Regular vision checks. Poor vision and conditions such as cataracts can make you more likely to have a fall. If you wear glasses, make sure to get your prescription updated if your vision changes. Medicine review. Work with your health care provider to regularly review all of the medicines you are taking, including over-the-counter medicines. Ask your health care provider about any side effects that may make you more likely to have a fall. Tell your health care provider if any medicines that you take make you feel dizzy or sleepy. Strength and balance checks. Your health care provider may recommend certain tests to check your strength and balance while standing, walking, or changing positions. Foot health exam. Foot pain and numbness, as well as not wearing proper footwear, can make you more likely to have a fall. Screenings, including: Osteoporosis screening. Osteoporosis is a condition that causes  the bones to get weaker and break more easily. Blood pressure screening. Blood pressure changes and medicines to control blood pressure can make you feel dizzy. Depression screening. You may be more likely to have a fall if you have a fear of falling, feel depressed, or feel unable to do activities that you used to do. Alcohol use screening. Using too much alcohol can affect your balance and may make you more likely to have a fall. Follow these instructions at home: Lifestyle Do not drink alcohol if: Your health care provider tells you not to drink. If you drink alcohol: Limit how much you have to: 0-1 drink a day for women. 0-2 drinks a day for men. Know how much alcohol is in your drink. In the [place] one drink equals one 12 oz bottle of beer (355 mL), one 5 oz glass of wine (148 mL), or one 1 oz glass of hard liquor (44 mL). Do not use any products that contain nicotine or tobacco. These products include cigarettes, chewing tobacco, and vaping devices, such as e-cigarettes. If you need help quitting, ask your health care provider. Activity  Follow a regular exercise program to stay fit. This will help you maintain your balance. Ask your health care provider what types of exercise are appropriate for you. If you need a cane or walker, use it as recommended by your health care provider. Wear supportive shoes that have nonskid soles. Safety  Remove any tripping hazards, such as rugs, cords, and clutter. Install safety equipment such as grab bars in bathrooms and safety rails on stairs. Keep rooms and walkways   well-lit. General instructions Talk with your health care provider about your risks for falling. Tell your health care provider if: You fall. Be sure to tell your health care provider about all falls, even ones that seem minor. You feel dizzy, tiredness (fatigue), or off-balance. Take over-the-counter and prescription medicines only as told by your health care provider. These include  supplements. Eat a healthy diet and maintain a healthy weight. A healthy diet includes low-fat dairy products, low-fat (lean) meats, and fiber from whole grains, beans, and lots of fruits and vegetables. Stay current with your vaccines. Schedule regular health, dental, and eye exams. Summary Having a healthy lifestyle and getting preventive care can help to protect your health and wellness after age 72. Screening and testing are the best way to find a health problem early and help you avoid having a fall. Early diagnosis and treatment give you the best chance for managing medical conditions that are more common for people who are older than age 72. Falls are a major cause of broken bones and head injuries in people who are older than age 72. Take precautions to prevent a fall at home. Work with your health care provider to learn what changes you can make to improve your health and wellness and to prevent falls. This information is not intended to replace advice given to you by your health care provider. Make sure you discuss any questions you have with your health care provider. Document Revised: 12/04/2020 Document Reviewed: 12/04/2020 Elsevier Patient Education  2022 [employer]  

## 2021-10-31 LAB — MICROALBUMIN / CREATININE URINE RATIO

## 2021-11-20 ENCOUNTER — Encounter: Payer: Self-pay | Admitting: Nurse Practitioner

## 2021-11-20 ENCOUNTER — Ambulatory Visit (INDEPENDENT_AMBULATORY_CARE_PROVIDER_SITE_OTHER): Payer: Medicare HMO | Admitting: Nurse Practitioner

## 2021-11-20 VITALS — BP 137/92 | HR 80 | Temp 97.2°F | Resp 20 | Ht 66.0 in | Wt 225.0 lb

## 2021-11-20 DIAGNOSIS — M25512 Pain in left shoulder: Secondary | ICD-10-CM

## 2021-11-20 MED ORDER — METHYLPREDNISOLONE ACETATE 40 MG/ML IJ SUSP
80.0000 mg | Freq: Once | INTRAMUSCULAR | Status: AC
  Administered 2021-11-20: 80 mg via INTRAMUSCULAR

## 2021-11-20 MED ORDER — KETOROLAC TROMETHAMINE 60 MG/2ML IM SOLN
60.0000 mg | Freq: Once | INTRAMUSCULAR | Status: AC
  Administered 2021-11-20: 60 mg via INTRAMUSCULAR

## 2021-11-20 NOTE — Patient Instructions (Signed)

## 2021-11-20 NOTE — Progress Notes (Signed)
? ?  Subjective:  ? ? Patient ID: Cynthia Leon, female    DOB: 1950-11-20, 71 y.o.   MRN: [id number] ? ? ?Chief Complaint: Left arm pain (Moving down in to left leg. ) ? ? ?HPI ?Patient come sin today c/o left arm pain. She said she reached to pickup something out of her cabinet and felt it "grab". Rates pain 8/10. Pain radiates down arm to hand. Nothing helps it and movement makes it worse. ? ? ? ?Review of Systems  ?Respiratory:  Negative for cough, choking, chest tightness and shortness of breath.   ?Cardiovascular:  Negative for chest pain, palpitations and leg swelling.  ?Musculoskeletal:  Positive for arthralgias (left arm).  ? ?   ?Objective:  ? Physical Exam ?Vitals reviewed.  ?Constitutional:   ?   Appearance: Normal appearance.  ?Cardiovascular:  ?   Rate and Rhythm: Normal rate and regular rhythm.  ?   Heart sounds: Normal heart sounds.  ?Pulmonary:  ?   Effort: Pulmonary effort is normal.  ?   Breath sounds: Normal breath sounds.  ?Musculoskeletal:  ?   Comments: FROM of left shoulder with pain on flexion and internal rotation ?Grips equal bil ?  ?Skin: ?   General: Skin is warm.  ?Neurological:  ?   General: No focal deficit present.  ?   Mental Status: She is alert and oriented to person, place, and time.  ?Psychiatric:     ?   Mood and Affect: Mood normal.     ?   Behavior: Behavior normal.  ? ? ?BP (!) 137/92   Pulse 80   Temp (!) 97.2 ?F (36.2 ?C) (Temporal)   Resp 20   Ht '5\' 6"'$  (1.676 m)   Wt 225 lb (102.1 kg)   SpO2 96%   BMI 36.32 kg/m?  ? ? ? ?   ?Assessment & Plan:  ? ?[name] in today with chief complaint of Left arm pain (Moving down in to left leg. ) ? ? ?1. Acute pain of left shoulder ?Moist heat ?rest ?- ketorolac (TORADOL) injection 60 mg ?- methylPREDNISolone acetate (DEPO-MEDROL) injection 80 mg ? ? ? ?The above assessment and management plan was discussed with the patient. The patient verbalized understanding of and has agreed to the management plan. Patient  is aware to call the clinic if symptoms persist or worsen. Patient is aware when to return to the clinic for a follow-up visit. Patient educated on when it is appropriate to go to the emergency department.  ? ?Mary-Margaret Hassell Done, FNP ? ? ?

## 2021-12-03 ENCOUNTER — Ambulatory Visit (INDEPENDENT_AMBULATORY_CARE_PROVIDER_SITE_OTHER): Payer: Medicare HMO

## 2021-12-03 ENCOUNTER — Other Ambulatory Visit: Payer: Self-pay | Admitting: Family

## 2021-12-03 ENCOUNTER — Other Ambulatory Visit: Payer: Self-pay | Admitting: Family Medicine

## 2021-12-03 VITALS — Wt 225.0 lb

## 2021-12-03 DIAGNOSIS — R059 Cough, unspecified: Secondary | ICD-10-CM

## 2021-12-03 DIAGNOSIS — Z0000 Encounter for general adult medical examination without abnormal findings: Secondary | ICD-10-CM

## 2021-12-03 DIAGNOSIS — Z1231 Encounter for screening mammogram for malignant neoplasm of breast: Secondary | ICD-10-CM

## 2021-12-03 NOTE — Progress Notes (Signed)
? ?Subjective:  ? Cynthia Leon is a 71 y.o. female who presents for Medicare Annual (Subsequent) preventive examination. ? ?Virtual Visit via Telephone Note ? ?I connected with  Cynthia Leon on 12/03/21 at 12:00 PM EDT by telephone and verified that I am speaking with the correct person using two identifiers. ? ?Location: ?Patient: Home ?Provider: [facility] ?Persons participating in the virtual visit: patient/Nurse Health Advisor ?  ?I discussed the limitations, risks, security and privacy concerns of performing an evaluation and management service by telephone and the availability of in person appointments. The patient expressed understanding and agreed to proceed. ? ?Interactive audio and video telecommunications were attempted between this nurse and patient, however failed, due to patient having technical difficulties OR patient did not have access to video capability.  We continued and completed visit with audio only. ? ?Some vital signs may be absent or patient reported.  ? ?Cynthia Latouche Dionne Ano, LPN  ? ?Review of Systems    ? ?Cardiac Risk Factors include: advanced age (>30mn, >>47women);obesity (BMI >30kg/m2);dyslipidemia;sedentary lifestyle ? ?   ?Objective:  ?  ?Today's Vitals  ? 12/03/21 1206  ?Weight: 225 lb (102.1 kg)  ?PainSc: 8   ? ?Body mass index is 36.32 kg/m?. ? ? ?  12/03/2021  ? 12:12 PM 12/01/2020  ?  9:37 AM 12/01/2019  ?  9:00 AM 11/30/2018  ?  9:08 AM 11/26/2017  ? 10:45 AM 11/25/2016  ? 10:41 AM 12/13/2015  ?  9:35 AM  ?Advanced Directives  ?Does Patient Have a Medical Advance Directive? No Yes Yes No No No No  ?Type of [place]ate [place] Healthcare Power of Attorney      ?Does patient want to make changes to medical advance directive?   No - Patient declined      ?[place] of [place]?  No - copy requested       ?Would patient like information on creating a medical advance directive? No - Patient declined   Yes  (MAU/Ambulatory/Procedural Areas - Information given) No - Patient declined Yes (MAU/Ambulatory/Procedural Areas - Information given)   ? ? ?Current Medications (verified) ?Outpatient Encounter Medications as of 12/03/2021  ?Medication Sig  ? ALPRAZolam (XANAX) 0.5 MG tablet Take 1 tablet (0.5 mg total) by mouth at bedtime as needed for anxiety.  ? Calcium & Magnesium Carbonates (MYLANTA PO) Take by mouth as needed.  ? cetirizine (ZYRTEC) 10 MG tablet TAKE 1 TABLET BY MOUTH EVERY DAY  ? cyclobenzaprine (FLEXERIL) 10 MG tablet TAKE 1 TABLET BY MOUTH THREE TIMES A DAY AS NEEDED FOR MUSCLE SPASMS  ? DULoxetine (CYMBALTA) 60 MG capsule Take 1 capsule (60 mg total) by mouth daily.  ? fluticasone (FLONASE) 50 MCG/ACT nasal spray USE 2 SPRAYS IN EACH NOSTRIL ONCE DAILY  ? Omeprazole-Sodium Bicarbonate (ZEGERID OTC) 20-1100 MG CAPS capsule Take 1 capsule by mouth daily before breakfast.  ? rosuvastatin (CRESTOR) 20 MG tablet TAKE 1 TABLET BY MOUTH EVERY DAY  ? topiramate (TOPAMAX) 50 MG tablet TAKE 1 TABLET BY MOUTH TWICE A DAY  ? ?No facility-administered encounter medications on file as of 12/03/2021.  ? ? ?Allergies (verified) ?Mobic [meloxicam] and Asa [aspirin]  ? ?History: ?Past Medical History:  ?Diagnosis Date  ? Abdominal pain   ? Allergic rhinitis   ? Anxiety   ? Constipation   ? Fibromyalgia   ? GERD (gastroesophageal reflux disease)   ? Head ache   ? Hyperlipidemia   ?  Osteopenia   ? PUD (peptic ulcer disease)   ? Right hip pain   ? Shingles   ? ?Past Surgical History:  ?Procedure Laterality Date  ? COLONOSCOPY N/A 12/13/2015  ? Procedure: COLONOSCOPY;  Surgeon: Rogene Houston, MD;  Location: [facility];  Service: Endoscopy;  Laterality: N/A;  1030  ? TOTAL HIP ARTHROPLASTY Right   ? ?Family History  ?Problem Relation Age of Onset  ? Breast cancer Mother   ? Coronary artery disease Mother   ? Cancer Mother   ?     breast  ? Emphysema Father   ? COPD Father   ? Hypertension Father   ? Heart disease Father   ?  Diabetes Sister   ? Cancer Sister   ?     throat  ? Coronary artery disease Sister   ? Diabetes Sister   ? Esophageal cancer Sister   ? Cancer Brother   ?     brain  ? Cancer Brother   ?     bone  ? Cancer Brother   ?     lung  ? Cancer Brother   ? Lupus Brother   ? Healthy Daughter   ? Healthy Daughter   ? Healthy Daughter   ? Healthy Daughter   ? ?Social History  ? ?Socioeconomic History  ? Marital status: Married  ?  Spouse name: Cynthia Leon  ? Number of children: 3  ? Years of education: 58  ? Highest education level: 11th grade  ?Occupational History  ? Occupation: disabled   ?Tobacco Use  ? Smoking status: Never  ?  Passive exposure: Yes  ? Smokeless tobacco: Never  ?Vaping Use  ? Vaping Use: Never used  ?Substance and Sexual Activity  ? Alcohol use: No  ? Drug use: No  ? Sexual activity: Yes  ?Other Topics Concern  ? Not on file  ?Social History Narrative  ? Lives with her husband, their 3 daughters all live nearby  ? ?Social Determinants of Health  ? ?Financial Resource Strain: Low Risk   ? Difficulty of Paying Living Expenses: Not hard at all  ?Food Insecurity: No Food Insecurity  ? Worried About Charity fundraiser in the Last Year: Never true  ? Ran Out of Food in the Last Year: Never true  ?Transportation Needs: No Transportation Needs  ? Lack of Transportation (Medical): No  ? Lack of Transportation (Non-Medical): No  ?Physical Activity: Insufficiently Active  ? Days of Exercise per Week: 5 days  ? Minutes of Exercise per Session: 20 min  ?Stress: No Stress Concern Present  ? Feeling of Stress : Only a little  ?Social Connections: Moderately Isolated  ? Frequency of Communication with Friends and Family: More than three times a week  ? Frequency of Social Gatherings with Friends and Family: More than three times a week  ? Attends Religious Services: Never  ? Active Member of Clubs or Organizations: No  ? Attends Archivist Meetings: Never  ? Marital Status: Married  ? ? ?Tobacco  Counseling ?Counseling given: Not Answered ? ? ?Clinical Intake: ? ?Pre-visit preparation completed: Yes ? ?Pain : 0-10 ?Pain Score: 8  ?Pain Type: Chronic pain ?Pain Location: Arm ?Pain Orientation: Left ?Pain Descriptors / Indicators: Aching, Sharp, Sore ?Pain Onset: More than a month ago ?Pain Frequency: Intermittent ? ?  ? ?BMI - recorded: 36.32 ?Nutritional Status: BMI > 30  Obese ?Nutritional Risks: None ?Diabetes: Yes (says she is borderline only) ?CBG done?:  No ?Did pt. bring in CBG monitor from home?: No ? ?How often do you need to have someone help you when you read instructions, pamphlets, or other written materials from your doctor or pharmacy?: 1 - Never ? ?Diabetic? Nutrition Risk Assessment: ? ?Has the patient had any N/V/D within the last 2 months?  No  ?Does the patient have any non-healing wounds?  No  ?Has the patient had any unintentional weight loss or weight gain?  No  ? ?Diabetes: ? ?Is the patient diabetic?   borderline ?If diabetic, was a CBG obtained today?  No  ?Did the patient bring in their glucometer from home?  No  ?How often do you monitor your CBG's? never.  ? ?Financial Strains and Diabetes Management: ? ?Are you having any financial strains with the device, your supplies or your medication? No .  ?Does the patient want to be seen by Chronic Care Management for management of their diabetes?  No  ?Would the patient like to be referred to a Nutritionist or for Diabetic Management?  No  ? ?Diabetic Exams: ? ?Diabetic Eye Exam: Completed 07/12/2021.  ? ?Diabetic Foot Exam: Completed 01/18/2021. Pt has been advised about the importance in completing this exam. Pt is scheduled for diabetic foot exam on next appt next month with [facility].   ? ?Interpreter Needed?: No ? ?Information entered by :: Latonda Larrivee, LPN ? ? ?Activities of Daily Living ? ?  12/03/2021  ? 12:12 PM  ?In your present state of health, do you have any difficulty performing the following activities:  ?Hearing? 0   ?Vision? 0  ?Difficulty concentrating or making decisions? 1  ?Walking or climbing stairs? 0  ?Dressing or bathing? 0  ?Doing errands, shopping? 0  ?Preparing Food and eating ? N  ?Using the Toilet? N  ?In the past six months, have you accidently l

## 2021-12-10 ENCOUNTER — Other Ambulatory Visit: Payer: Self-pay | Admitting: Family

## 2021-12-10 DIAGNOSIS — M797 Fibromyalgia: Secondary | ICD-10-CM

## 2021-12-16 ENCOUNTER — Other Ambulatory Visit: Payer: Self-pay | Admitting: Family

## 2022-01-04 ENCOUNTER — Other Ambulatory Visit: Payer: Self-pay | Admitting: Family

## 2022-01-04 DIAGNOSIS — M797 Fibromyalgia: Secondary | ICD-10-CM

## 2022-01-04 DIAGNOSIS — F411 Generalized anxiety disorder: Secondary | ICD-10-CM

## 2022-01-21 ENCOUNTER — Ambulatory Visit
Admission: RE | Admit: 2022-01-21 | Discharge: 2022-01-21 | Disposition: A | Discharge status: Home / Self Care | Payer: Medicare HMO | Source: Ambulatory Visit | Attending: Family | Admitting: Family

## 2022-01-21 DIAGNOSIS — Z1231 Encounter for screening mammogram for malignant neoplasm of breast: Secondary | ICD-10-CM

## 2022-01-22 IMAGING — MG DIGITAL SCREENING BILAT W/ CAD
4 series · 4 of 4 positions shown · non-contrast
Comparison: Previous exam(s).

CLINICAL DATA: Screening.

EXAM:
DIGITAL SCREENING BILATERAL MAMMOGRAM WITH CAD
TECHNIQUE: Bilateral screening digital craniocaudal and mediolateral oblique
mammograms were obtained. The images were evaluated with
computer-aided detection.

[R CC]
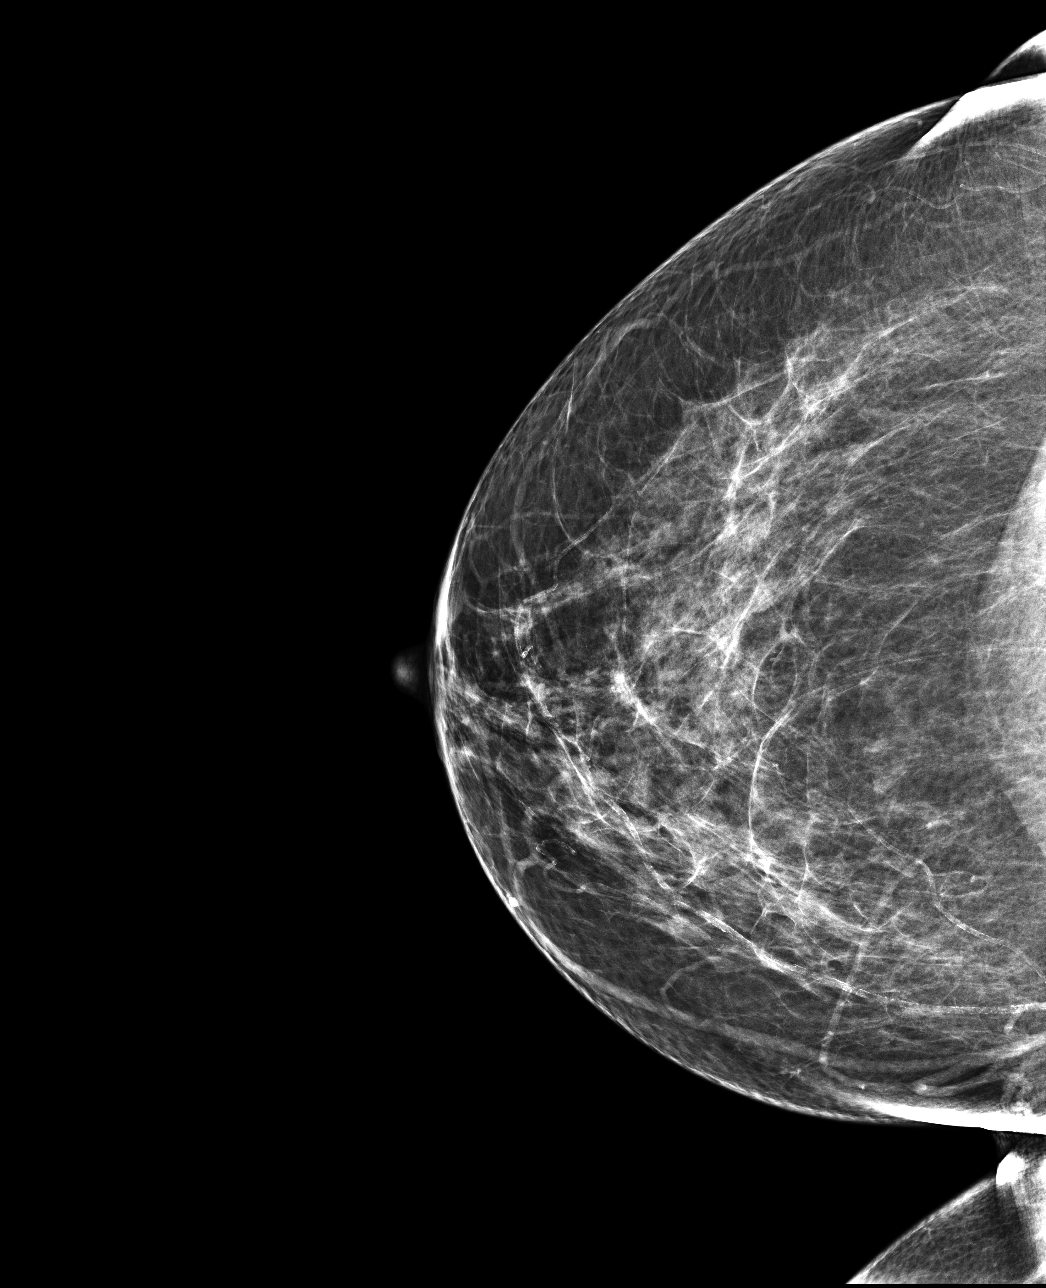

[R MLO]
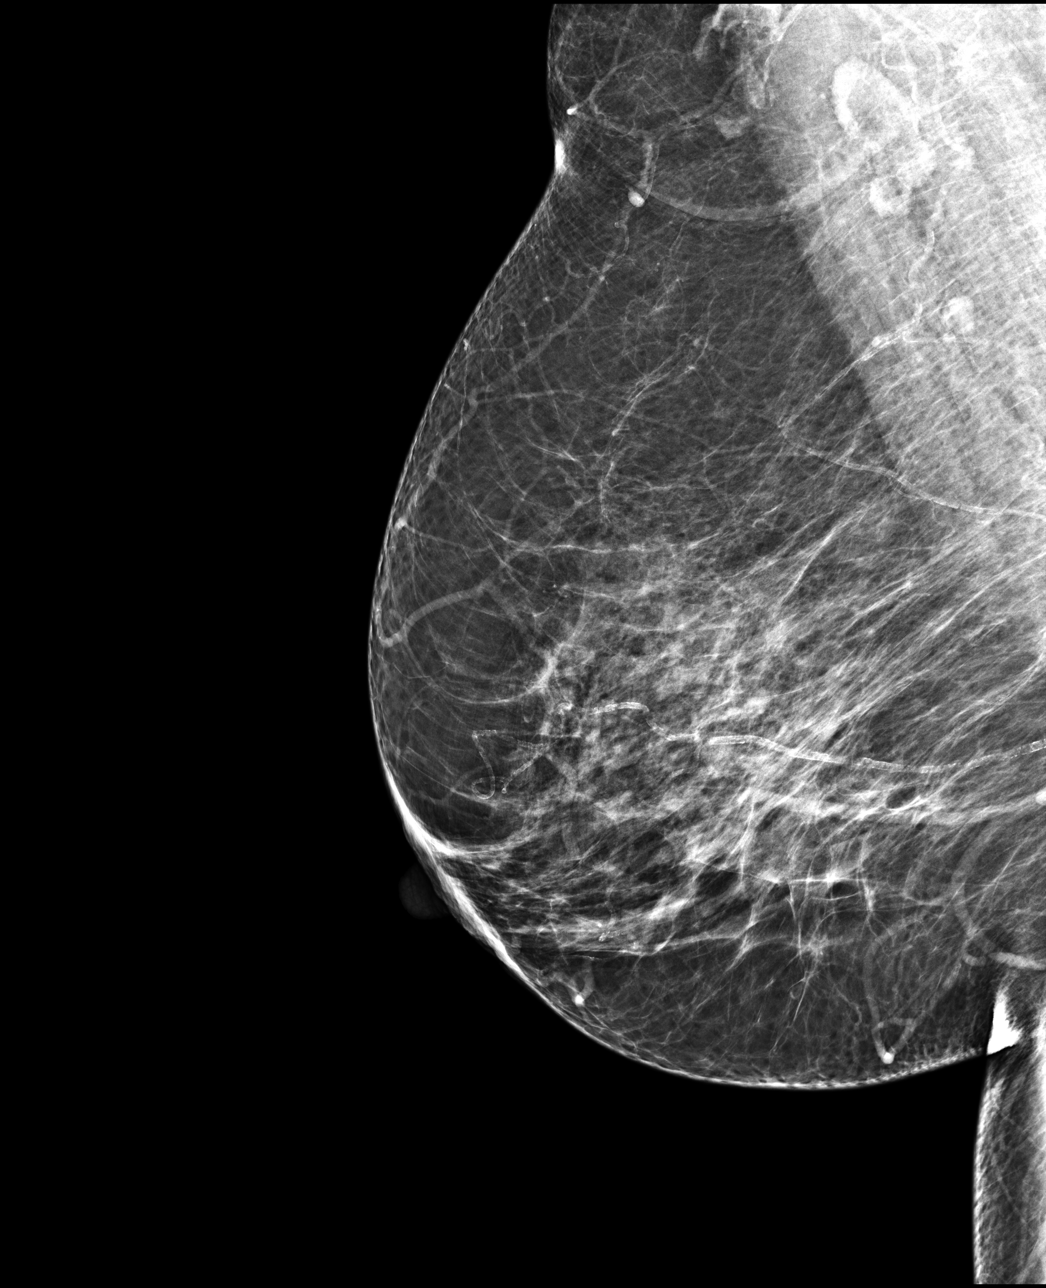

[L MLO]
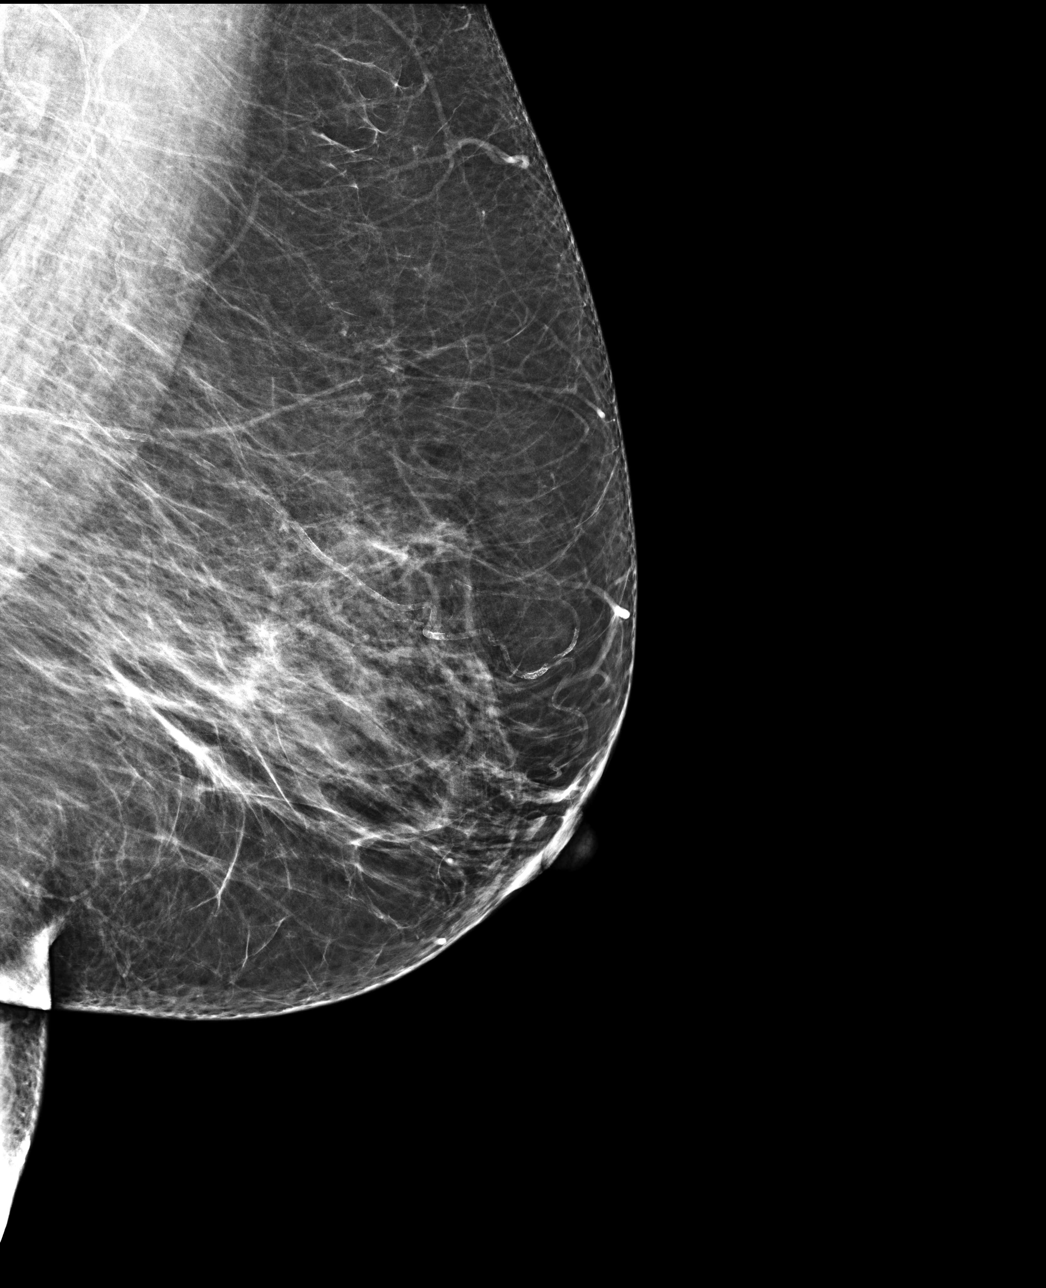

[L CC]
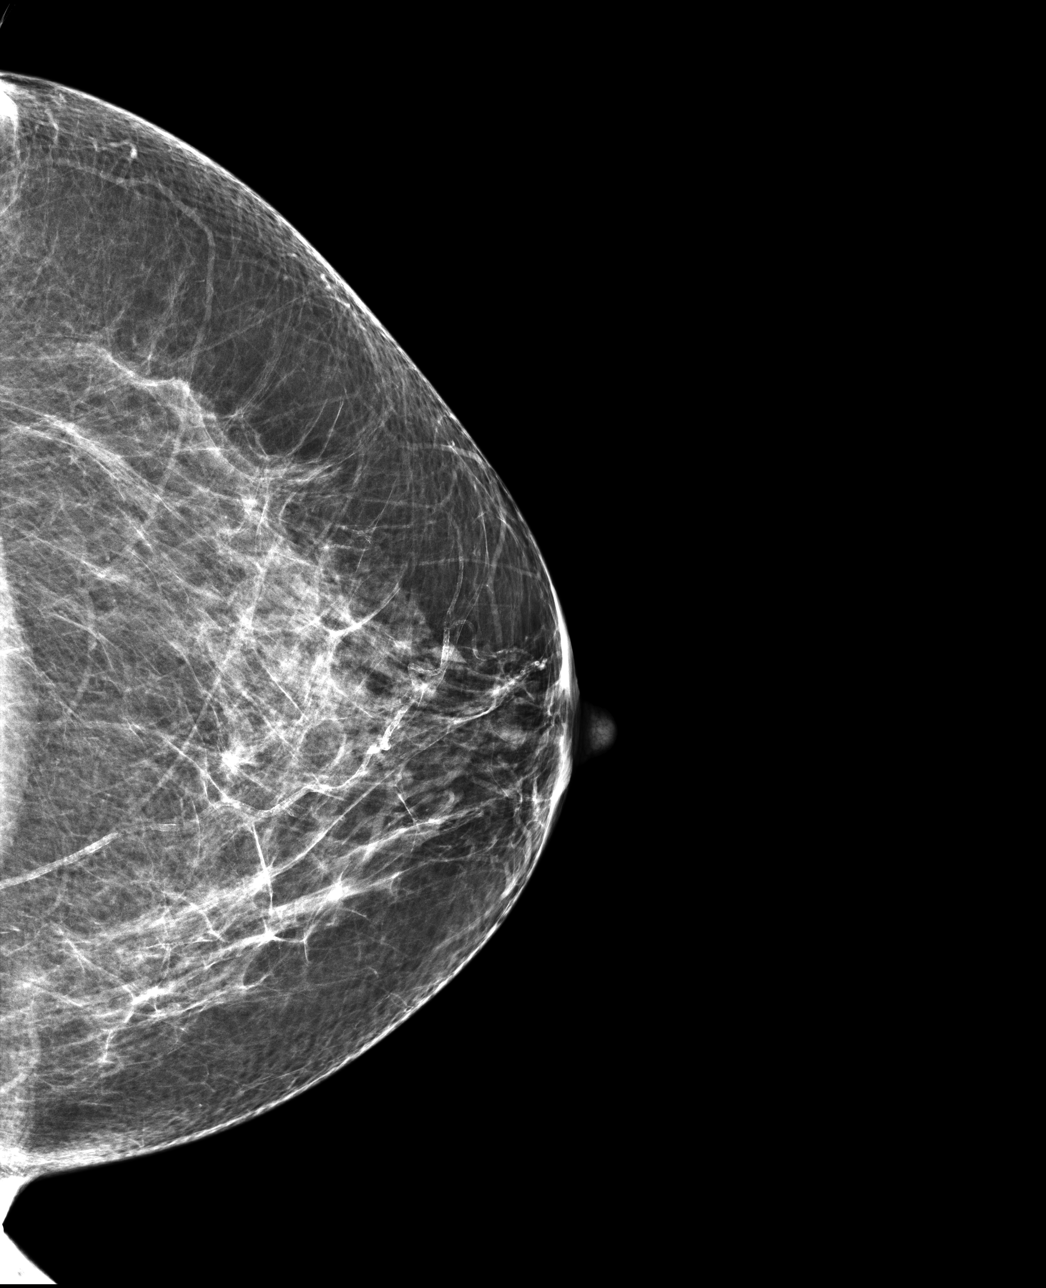

[4 of 4 positions shown; findings below may reference images not displayed]

ACR Breast Density Category c: The breast tissue is heterogeneously
dense, which may obscure small masses.
FINDINGS: There are no findings suspicious for malignancy.
IMPRESSION: No mammographic evidence of malignancy. A result letter of this
screening mammogram will be mailed directly to the patient.

RECOMMENDATION:
Screening mammogram in one year. (Code:TJ-B-ZPA)

BI-RADS CATEGORY  1: Negative.

## 2022-01-22 IMAGING — DX DG KNEE 1-2V*L*
2 series · 2 of 2 positions shown · non-contrast
Comparison: None.

CLINICAL DATA: 69-year-old female with left knee pain.

EXAM:
LEFT KNEE - 1-2 VIEW

[knee ap]
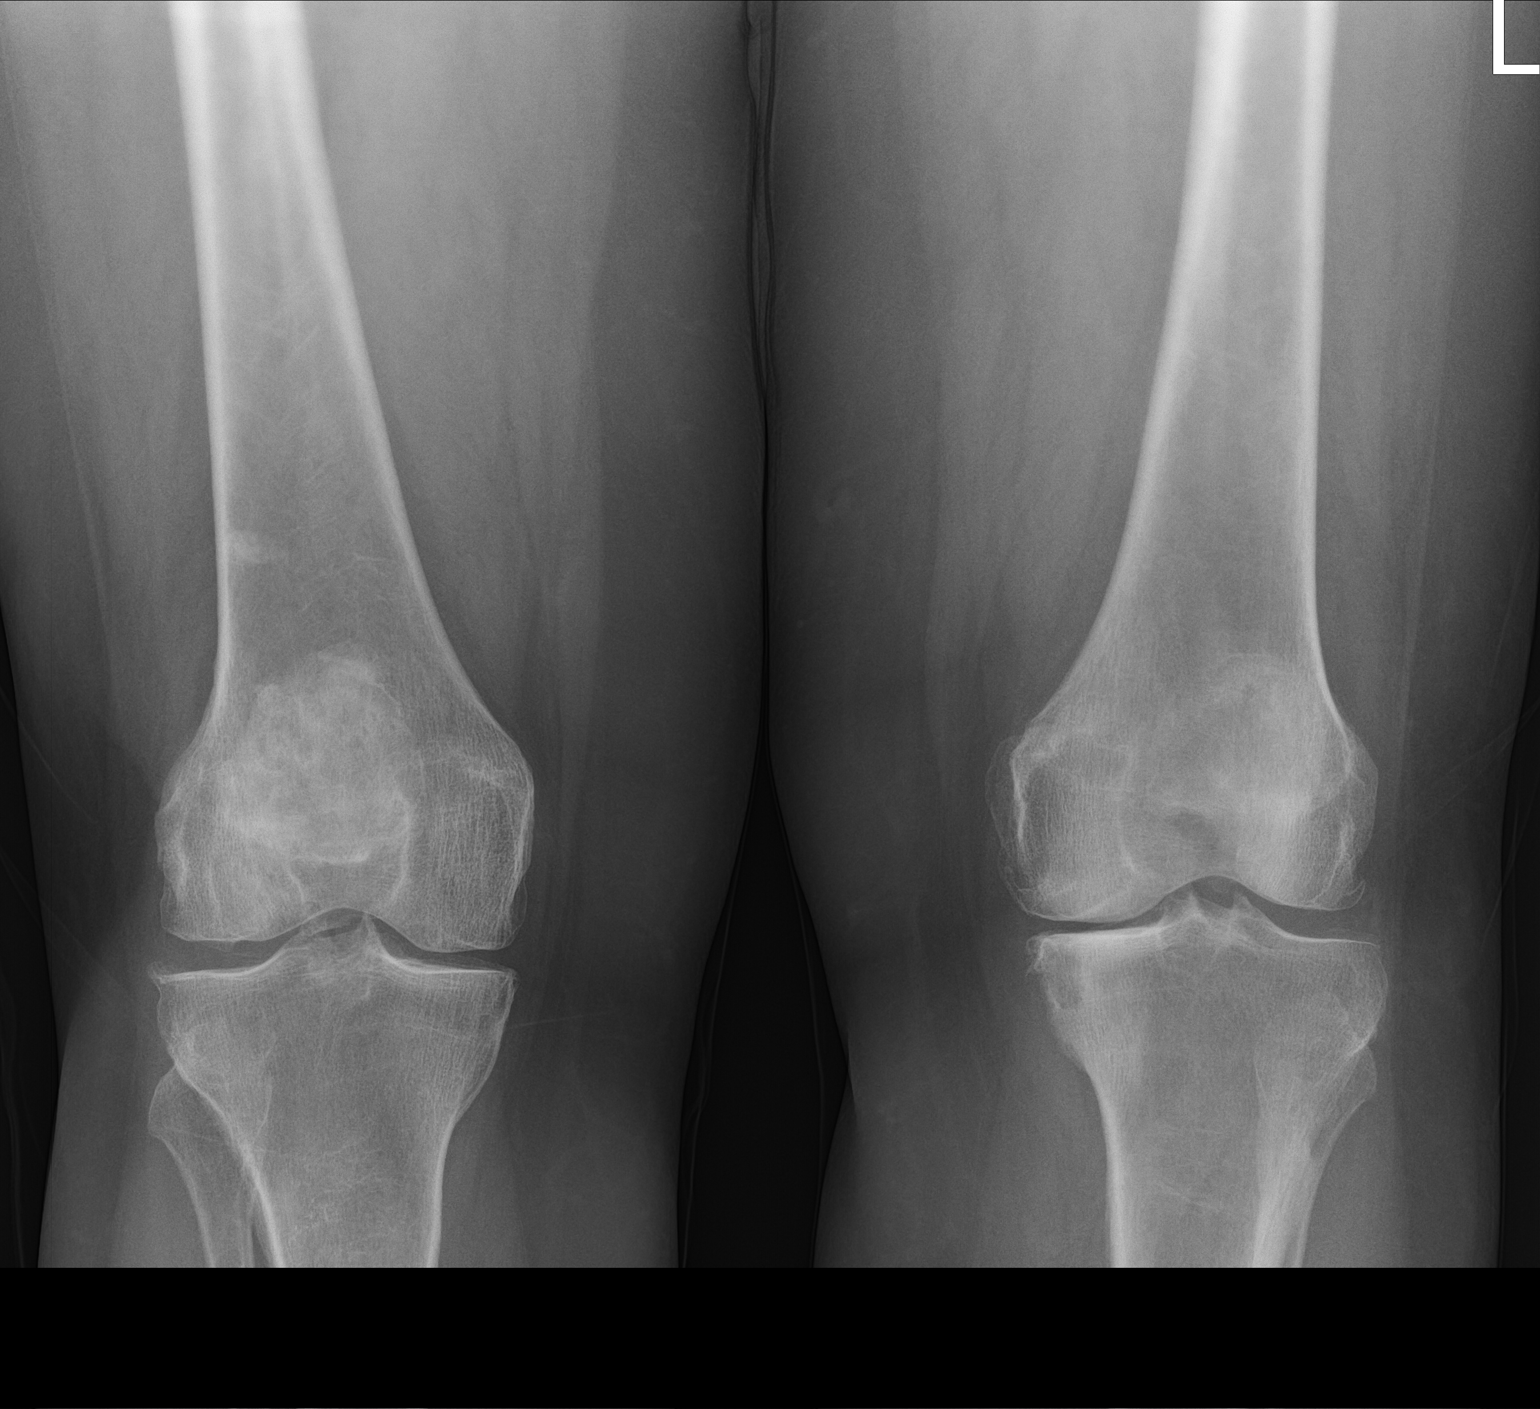

[knee lat]
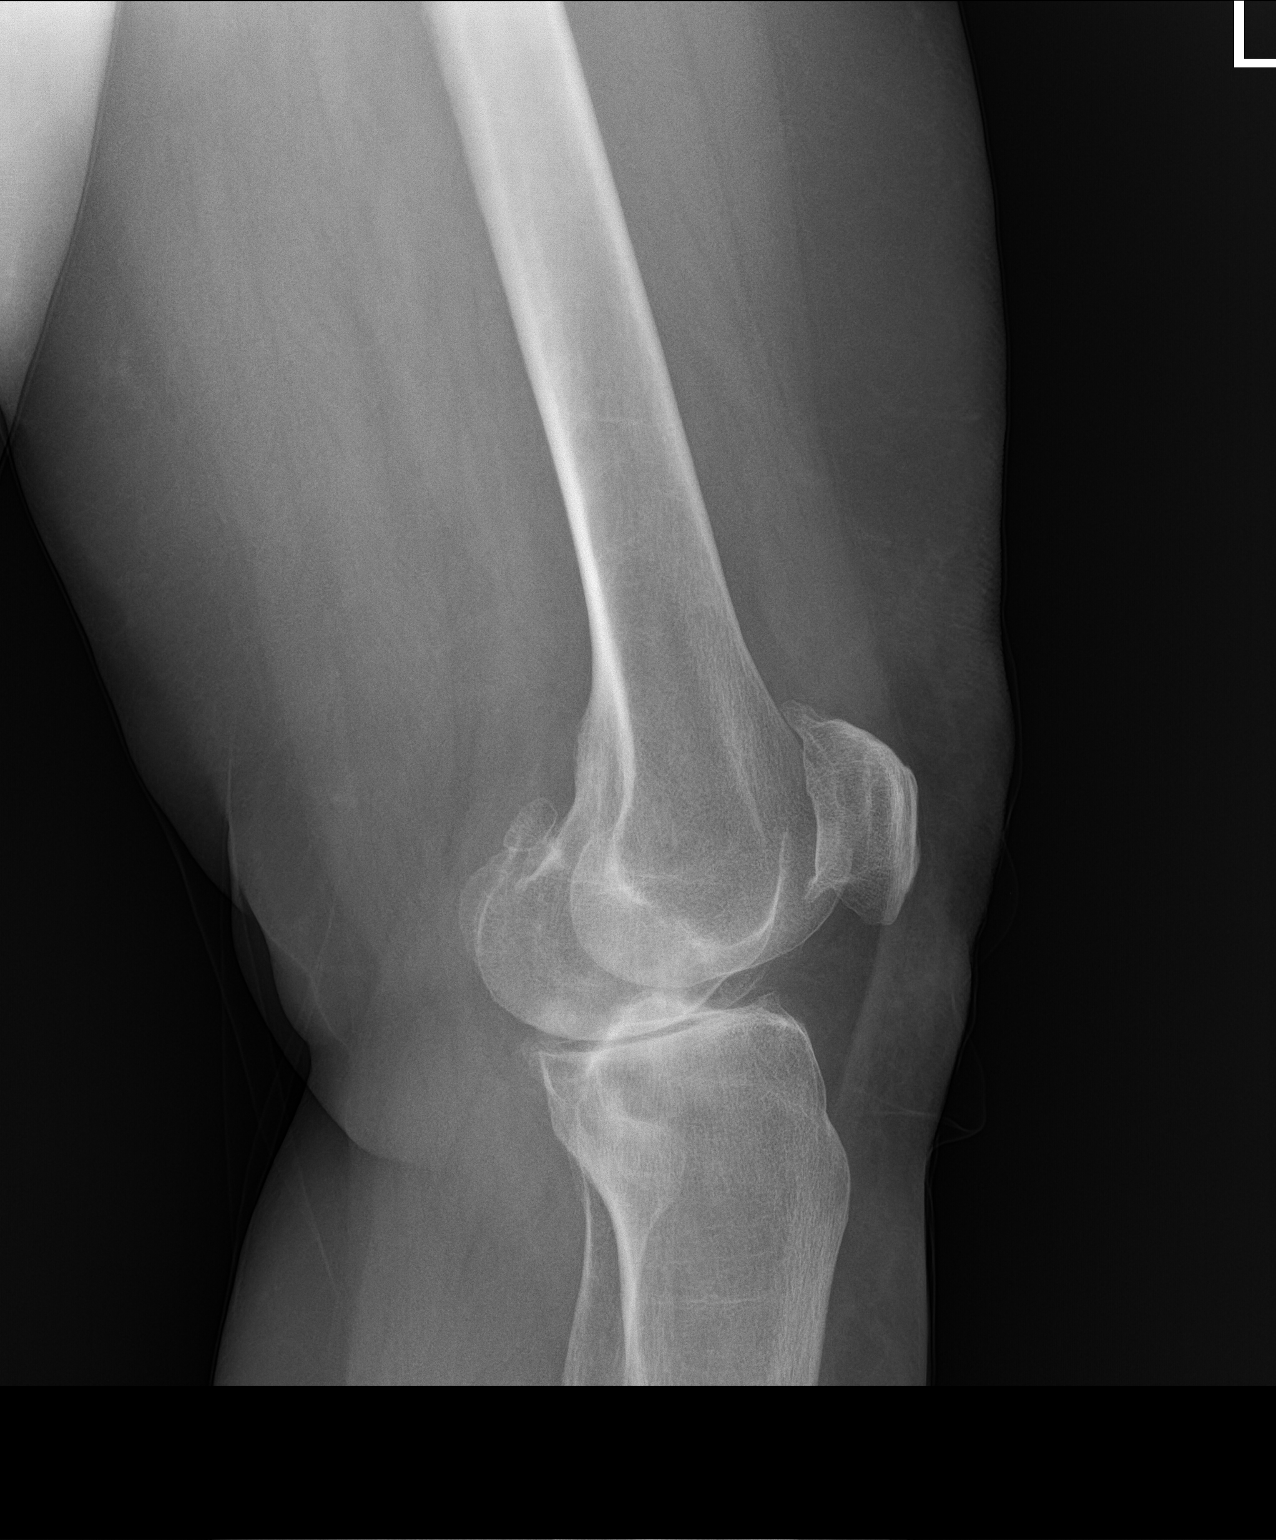

[2 of 2 positions shown; findings below may reference images not displayed]

FINDINGS: There is no acute fracture or dislocation. There is osteopenia with
moderate bilateral knee arthritic changes, left greater right and
most severely involving the medial compartment. No significant joint
effusion. The soft tissues are unremarkable.
IMPRESSION: 1. No acute fracture or dislocation.
2. Osteopenia with moderate bilateral knee arthritic changes.

## 2022-01-24 ENCOUNTER — Ambulatory Visit (INDEPENDENT_AMBULATORY_CARE_PROVIDER_SITE_OTHER): Payer: Medicare HMO | Admitting: Family

## 2022-01-24 ENCOUNTER — Encounter: Payer: Self-pay | Admitting: Family

## 2022-01-24 VITALS — BP 125/74 | Temp 97.1°F | Ht 66.0 in | Wt 222.6 lb

## 2022-01-24 DIAGNOSIS — F1320 Sedative, hypnotic or anxiolytic dependence, uncomplicated: Secondary | ICD-10-CM

## 2022-01-24 DIAGNOSIS — E1169 Type 2 diabetes mellitus with other specified complication: Secondary | ICD-10-CM

## 2022-01-24 DIAGNOSIS — R739 Hyperglycemia, unspecified: Secondary | ICD-10-CM | POA: Diagnosis not present

## 2022-01-24 DIAGNOSIS — M159 Polyosteoarthritis, unspecified: Secondary | ICD-10-CM

## 2022-01-24 DIAGNOSIS — Z0000 Encounter for general adult medical examination without abnormal findings: Secondary | ICD-10-CM

## 2022-01-24 DIAGNOSIS — E559 Vitamin D deficiency, unspecified: Secondary | ICD-10-CM | POA: Diagnosis not present

## 2022-01-24 DIAGNOSIS — G43009 Migraine without aura, not intractable, without status migrainosus: Secondary | ICD-10-CM | POA: Diagnosis not present

## 2022-01-24 DIAGNOSIS — Z0001 Encounter for general adult medical examination with abnormal findings: Secondary | ICD-10-CM | POA: Diagnosis not present

## 2022-01-24 DIAGNOSIS — Z79899 Other long term (current) drug therapy: Secondary | ICD-10-CM

## 2022-01-24 DIAGNOSIS — K219 Gastro-esophageal reflux disease without esophagitis: Secondary | ICD-10-CM

## 2022-01-24 DIAGNOSIS — E785 Hyperlipidemia, unspecified: Secondary | ICD-10-CM | POA: Diagnosis not present

## 2022-01-24 DIAGNOSIS — R69 Illness, unspecified: Secondary | ICD-10-CM | POA: Diagnosis not present

## 2022-01-24 DIAGNOSIS — F411 Generalized anxiety disorder: Secondary | ICD-10-CM | POA: Diagnosis not present

## 2022-01-24 DIAGNOSIS — M150 Primary generalized (osteo)arthritis: Secondary | ICD-10-CM

## 2022-01-24 DIAGNOSIS — K5900 Constipation, unspecified: Secondary | ICD-10-CM | POA: Diagnosis not present

## 2022-01-24 LAB — BAYER DCA HB A1C WAIVED: HB A1C (BAYER DCA - WAIVED): 6 % — ABNORMAL HIGH (ref 4.8–5.6)

## 2022-01-24 MED ORDER — ALPRAZOLAM 0.5 MG PO TABS: 0.5000 mg | ORAL_TABLET | Freq: Every evening | ORAL | 2 refills | Status: DC | PRN

## 2022-01-24 NOTE — Progress Notes (Signed)
Subjective:    Patient ID: Cynthia Leon, female    DOB: 1951/07/16, 71 y.o.   MRN: [id number]  Chief Complaint  Patient presents with   Medical Management of Chronic Issues   Pt presents to the office today for CPE and chronic follow up. Pt is morbid obese today with a BMI of 35 and co-moribility of DM and GERD.  Gastroesophageal Reflux She complains of belching, heartburn and a hoarse voice. This is a chronic problem. The current episode started more than 1 year ago. The problem occurs occasionally. The problem has been waxing and waning. The symptoms are aggravated by certain foods. Risk factors include obesity. She has tried a PPI for the symptoms. The treatment provided moderate relief.  Migraine  This is a chronic problem. The current episode started more than 1 year ago. The problem occurs intermittently. The problem has been waxing and waning. The pain is located in the Frontal region. The pain quality is similar to prior headaches. The quality of the pain is described as aching. Pertinent negatives include no blurred vision. She has tried beta blockers for the symptoms. The treatment provided moderate relief. Her past medical history is significant for obesity.  Diabetes She presents for her follow-up diabetic visit. She has type 2 diabetes mellitus. Hypoglycemia symptoms include nervousness/anxiousness. Pertinent negatives for diabetes include no blurred vision and no foot paresthesias. Symptoms are stable. Diabetic complications include heart disease. Pertinent negatives for diabetic complications include no nephropathy or peripheral neuropathy. Risk factors for coronary artery disease include dyslipidemia, diabetes mellitus, hypertension, sedentary lifestyle and post-menopausal. She is following a generally unhealthy diet. (Does not check at home) Eye exam is current.  Arthritis Presents for follow-up visit. She complains of pain and stiffness. The symptoms have been stable.  Affected locations include the left knee, right knee, left MCP and right MCP. Her pain is at a severity of 7/10.  Hyperlipidemia This is a chronic problem. The current episode started more than 1 year ago. The problem is controlled. Recent lipid tests were reviewed and are normal. Exacerbating diseases include obesity. Current antihyperlipidemic treatment includes statins. The current treatment provides moderate improvement of lipids. Risk factors for coronary artery disease include dyslipidemia, diabetes mellitus, hypertension, a sedentary lifestyle and post-menopausal.  Anxiety Presents for follow-up visit. Symptoms include depressed mood, excessive worry, irritability and nervous/anxious behavior. Symptoms occur occasionally.    Constipation This is a chronic problem. The current episode started more than 1 year ago. The problem has been waxing and waning since onset. Her stool frequency is 2 to 3 times per week. She has tried laxatives for the symptoms. The treatment provided moderate relief.      Review of Systems  Constitutional:  Positive for irritability.  HENT:  Positive for hoarse voice.   Eyes:  Negative for blurred vision.  Gastrointestinal:  Positive for constipation and heartburn.  Musculoskeletal:  Positive for arthritis and stiffness.  Psychiatric/Behavioral:  The patient is nervous/anxious.   All other systems reviewed and are negative.  Family History  Problem Relation Age of Onset   Breast cancer Mother    Coronary artery disease Mother    Cancer Mother        breast   Emphysema Father    COPD Father    Hypertension Father    Heart disease Father    Diabetes Sister    Cancer Sister        throat   Coronary artery disease Sister    Diabetes Sister  Esophageal cancer Sister    Cancer Brother        brain   Cancer Brother        bone   Cancer Brother        lung   Cancer Brother    Lupus Brother    Healthy Daughter    Healthy Daughter    Healthy  Daughter    Healthy Daughter    Social History   Socioeconomic History   Marital status: Married    Spouse name: Mikeal Hawthorne   Number of children: 3   Years of education: 11   Highest education level: 11th grade  Occupational History   Occupation: disabled   Tobacco Use   Smoking status: Never    Passive exposure: Yes   Smokeless tobacco: Never  Vaping Use   Vaping Use: Never used  Substance and Sexual Activity   Alcohol use: No   Drug use: No   Sexual activity: Yes  Other Topics Concern   Not on file  Social History Narrative   Lives with her husband, their 3 daughters all live nearby   Social Determinants of Health   Financial Resource Strain: [place]  (12/03/2021)   Overall Financial Resource Strain (CARDIA)    Difficulty of Paying Living Expenses: Not hard at all  Food Insecurity: No Food Insecurity (12/03/2021)   Hunger Vital Sign    Worried About Running Out of Food in the Last Year: Never true    Ran Out of Food in the Last Year: Never true  Transportation Needs: No Transportation Needs (12/03/2021)   PRAPARE - Hydrologist (Medical): No    Lack of Transportation (Non-Medical): No  Physical Activity: Insufficiently Active (12/03/2021)   Exercise Vital Sign    Days of Exercise per Week: 5 days    Minutes of Exercise per Session: 20 min  Stress: No Stress Concern Present (12/03/2021)   [place]    Feeling of Stress : Only a little  Social Connections: Moderately Isolated (12/03/2021)   Social Connection and Isolation Panel [NHANES]    Frequency of Communication with Friends and Family: More than three times a week    Frequency of Social Gatherings with Friends and Family: More than three times a week    Attends Religious Services: Never    Marine scientist or Organizations: No    Attends Archivist Meetings: Never    Marital Status: Married       Objective:    Physical Exam Vitals reviewed.  Constitutional:      General: She is not in acute distress.    Appearance: She is well-developed. She is obese.  HENT:     Head: Normocephalic and atraumatic.     Right Ear: Tympanic membrane normal.     Left Ear: Tympanic membrane normal.  Eyes:     Pupils: Pupils are equal, round, and reactive to light.  Neck:     Thyroid: No thyromegaly.  Cardiovascular:     Rate and Rhythm: Normal rate and regular rhythm.     Heart sounds: Normal heart sounds. No murmur heard. Pulmonary:     Effort: Pulmonary effort is normal. No respiratory distress.     Breath sounds: Normal breath sounds. No wheezing.  Abdominal:     General: Bowel sounds are normal. There is no distension.     Palpations: Abdomen is soft.     Tenderness: There is  no abdominal tenderness.  Musculoskeletal:        General: No tenderness. Normal range of motion.     Cervical back: Normal range of motion and neck supple.  Skin:    General: Skin is warm and dry.  Neurological:     Mental Status: She is alert and oriented to person, place, and time.     Cranial Nerves: No cranial nerve deficit.     Deep Tendon Reflexes: Reflexes are normal and symmetric.  Psychiatric:        Behavior: Behavior normal.        Thought Content: Thought content normal.        Judgment: Judgment normal.     Diabetic Foot Exam - Simple   Simple Foot Form Diabetic Foot exam was performed with the following findings: Yes 01/24/2022  9:39 AM  Visual Inspection No deformities, no ulcerations, no other skin breakdown bilaterally: Yes Sensation Testing Intact to touch and monofilament testing bilaterally: Yes Pulse Check Posterior Tibialis and Dorsalis pulse intact bilaterally: Yes Comments      BP 125/74   Temp (!) 97.1 F (36.2 C)   Ht 5' 6" (1.676 m)   Wt 222 lb 9.6 oz (101 kg)   SpO2 98%   BMI 35.93 kg/m      Assessment & Plan:  Cynthia Leon comes in today with chief complaint of  Medical Management of Chronic Issues   Diagnosis and orders addressed:  1. Controlled substance agreement signed - ToxASSURE Select 13 (MW), Urine - ALPRAZolam (XANAX) 0.5 MG tablet; Take 1 tablet (0.5 mg total) by mouth at bedtime as needed for anxiety.  Dispense: 30 tablet; Refill: 2 - CMP14+EGFR - CBC with Differential/Platelet  2. Type 2 diabetes mellitus with other specified complication, without long-term current use of insulin (HCC) - CMP14+EGFR - CBC with Differential/Platelet  3. Gastroesophageal reflux disease, unspecified whether esophagitis present - CMP14+EGFR - CBC with Differential/Platelet  4. Migraine without aura and without status migrainosus, not intractable - CMP14+EGFR - CBC with Differential/Platelet  5. Primary osteoarthritis involving multiple joints - CMP14+EGFR - CBC with Differential/Platelet  6. Morbid obesity ([place]) - CMP14+EGFR - CBC with Differential/Platelet  7. Vitamin D deficiency - CMP14+EGFR - CBC with Differential/Platelet  8. Hyperlipidemia, unspecified hyperlipidemia type - CMP14+EGFR - CBC with Differential/Platelet - Lipid panel  9. Constipation, unspecified constipation type - CMP14+EGFR - CBC with Differential/Platelet  10. GAD (generalized anxiety disorder) - ALPRAZolam (XANAX) 0.5 MG tablet; Take 1 tablet (0.5 mg total) by mouth at bedtime as needed for anxiety.  Dispense: 30 tablet; Refill: 2 - CMP14+EGFR - CBC with Differential/Platelet  11. Benzodiazepine dependence (HCC) - ALPRAZolam (XANAX) 0.5 MG tablet; Take 1 tablet (0.5 mg total) by mouth at bedtime as needed for anxiety.  Dispense: 30 tablet; Refill: 2 - CMP14+EGFR - CBC with Differential/Platelet  12. Annual physical exam - Bayer DCA Hb A1c Waived - CMP14+EGFR - CBC with Differential/Platelet - Microalbumin / creatinine urine ratio - Lipid panel - TSH   Labs pending Patient reviewed in [place] controlled database, no flags noted. Contract and drug  screen up dated today.  Health Maintenance reviewed Diet and exercise encouraged  Follow up plan: 3 months    Evelina Dun, FNP

## 2022-01-24 NOTE — Patient Instructions (Signed)
Health Maintenance After Age 72 After age 72, you are at a higher risk for certain long-term diseases and infections as well as injuries from falls. Falls are a major cause of broken bones and head injuries in people who are older than age 72. Getting regular preventive care can help to keep you healthy and well. Preventive care includes getting regular testing and making lifestyle changes as recommended by your health care provider. Talk with your health care provider about: Which screenings and tests you should have. A screening is a test that checks for a disease when you have no symptoms. A diet and exercise plan that is right for you. What should I know about screenings and tests to prevent falls? Screening and testing are the best ways to find a health problem early. Early diagnosis and treatment give you the best chance of managing medical conditions that are common after age 72. Certain conditions and lifestyle choices may make you more likely to have a fall. Your health care provider may recommend: Regular vision checks. Poor vision and conditions such as cataracts can make you more likely to have a fall. If you wear glasses, make sure to get your prescription updated if your vision changes. Medicine review. Work with your health care provider to regularly review all of the medicines you are taking, including over-the-counter medicines. Ask your health care provider about any side effects that may make you more likely to have a fall. Tell your health care provider if any medicines that you take make you feel dizzy or sleepy. Strength and balance checks. Your health care provider may recommend certain tests to check your strength and balance while standing, walking, or changing positions. Foot health exam. Foot pain and numbness, as well as not wearing proper footwear, can make you more likely to have a fall. Screenings, including: Osteoporosis screening. Osteoporosis is a condition that causes  the bones to get weaker and break more easily. Blood pressure screening. Blood pressure changes and medicines to control blood pressure can make you feel dizzy. Depression screening. You may be more likely to have a fall if you have a fear of falling, feel depressed, or feel unable to do activities that you used to do. Alcohol use screening. Using too much alcohol can affect your balance and may make you more likely to have a fall. Follow these instructions at home: Lifestyle Do not drink alcohol if: Your health care provider tells you not to drink. If you drink alcohol: Limit how much you have to: 0-1 drink a day for women. 0-2 drinks a day for men. Know how much alcohol is in your drink. In the [place] one drink equals one 12 oz bottle of beer (355 mL), one 5 oz glass of wine (148 mL), or one 1 oz glass of hard liquor (44 mL). Do not use any products that contain nicotine or tobacco. These products include cigarettes, chewing tobacco, and vaping devices, such as e-cigarettes. If you need help quitting, ask your health care provider. Activity  Follow a regular exercise program to stay fit. This will help you maintain your balance. Ask your health care provider what types of exercise are appropriate for you. If you need a cane or walker, use it as recommended by your health care provider. Wear supportive shoes that have nonskid soles. Safety  Remove any tripping hazards, such as rugs, cords, and clutter. Install safety equipment such as grab bars in bathrooms and safety rails on stairs. Keep rooms and walkways   well-lit. General instructions Talk with your health care provider about your risks for falling. Tell your health care provider if: You fall. Be sure to tell your health care provider about all falls, even ones that seem minor. You feel dizzy, tiredness (fatigue), or off-balance. Take over-the-counter and prescription medicines only as told by your health care provider. These include  supplements. Eat a healthy diet and maintain a healthy weight. A healthy diet includes low-fat dairy products, low-fat (lean) meats, and fiber from whole grains, beans, and lots of fruits and vegetables. Stay current with your vaccines. Schedule regular health, dental, and eye exams. Summary Having a healthy lifestyle and getting preventive care can help to protect your health and wellness after age 72. Screening and testing are the best way to find a health problem early and help you avoid having a fall. Early diagnosis and treatment give you the best chance for managing medical conditions that are more common for people who are older than age 72. Falls are a major cause of broken bones and head injuries in people who are older than age 72. Take precautions to prevent a fall at home. Work with your health care provider to learn what changes you can make to improve your health and wellness and to prevent falls. This information is not intended to replace advice given to you by your health care provider. Make sure you discuss any questions you have with your health care provider. Document Revised: 12/04/2020 Document Reviewed: 12/04/2020 Elsevier Patient Education  2023 Elsevier Inc.  

## 2022-01-25 LAB — CMP14+EGFR
ALT: 16 IU/L (ref 0–32)
AST: 15 IU/L (ref 0–40)
Albumin/Globulin Ratio: 1.7 (ref 1.2–2.2)
Albumin: 4.4 g/dL (ref 3.7–4.7)
Alkaline Phosphatase: 90 IU/L (ref 44–121)
BUN/Creatinine Ratio: 7 — ABNORMAL LOW (ref 12–28)
BUN: 6 mg/dL — ABNORMAL LOW (ref 8–27)
Bilirubin Total: 0.3 mg/dL (ref 0.0–1.2)
CO2: 23 mmol/L (ref 20–29)
Calcium: 10.1 mg/dL (ref 8.7–10.3)
Chloride: 102 mmol/L (ref 96–106)
Creatinine, Ser: 0.9 mg/dL (ref 0.57–1.00)
Globulin, Total: 2.6 g/dL (ref 1.5–4.5)
Glucose: 115 mg/dL — ABNORMAL HIGH (ref 70–99)
Potassium: 4.3 mmol/L (ref 3.5–5.2)
Sodium: 140 mmol/L (ref 134–144)
Total Protein: 7 g/dL (ref 6.0–8.5)
eGFR: 68 mL/min/{1.73_m2} (ref >59)

## 2022-01-25 LAB — CBC WITH DIFFERENTIAL/PLATELET
Basophils Absolute: 0.1 10*3/uL (ref 0.0–0.2)
Basos: 1 %
EOS (ABSOLUTE): 0.4 10*3/uL (ref 0.0–0.4)
Eos: 4 %
Hematocrit: 42.3 % (ref 34.0–46.6)
Hemoglobin: 14.2 g/dL (ref 11.1–15.9)
Immature Grans (Abs): 0 10*3/uL (ref 0.0–0.1)
Immature Granulocytes: 0 %
Lymphocytes Absolute: 1.6 10*3/uL (ref 0.7–3.1)
Lymphs: 17 %
MCH: 28.9 pg (ref 26.6–33.0)
MCHC: 33.6 g/dL (ref 31.5–35.7)
MCV: 86 fL (ref 79–97)
Monocytes Absolute: 0.6 10*3/uL (ref 0.1–0.9)
Monocytes: 6 %
Neutrophils Absolute: 6.6 10*3/uL (ref 1.4–7.0)
Neutrophils: 72 %
Platelets: 297 10*3/uL (ref 150–450)
RBC: 4.92 x10E6/uL (ref 3.77–5.28)
RDW: 12.4 % (ref 11.7–15.4)
WBC: 9.3 10*3/uL (ref 3.4–10.8)

## 2022-01-25 LAB — LIPID PANEL
Chol/HDL Ratio: 3.1 ratio (ref 0.0–4.4)
Cholesterol, Total: 147 mg/dL (ref 100–199)
HDL: 48 mg/dL (ref >39)
LDL Chol Calc (NIH): 72 mg/dL (ref 0–99)
Triglycerides: 161 mg/dL — ABNORMAL HIGH (ref 0–149)
VLDL Cholesterol Cal: 27 mg/dL (ref 5–40)

## 2022-01-25 LAB — TSH: TSH: 2.12 u[IU]/mL (ref 0.450–4.500)

## 2022-01-25 LAB — MICROALBUMIN / CREATININE URINE RATIO
Creatinine, Urine: 30.2 mg/dL
Microalb/Creat Ratio: 95 mg/g creat — ABNORMAL HIGH (ref 0–29)
Microalbumin, Urine: 28.8 ug/mL

## 2022-01-28 LAB — TOXASSURE SELECT 13 (MW), URINE

## 2022-02-18 ENCOUNTER — Other Ambulatory Visit: Payer: Self-pay | Admitting: Family

## 2022-02-18 DIAGNOSIS — F1320 Sedative, hypnotic or anxiolytic dependence, uncomplicated: Secondary | ICD-10-CM

## 2022-02-18 DIAGNOSIS — F411 Generalized anxiety disorder: Secondary | ICD-10-CM

## 2022-02-18 DIAGNOSIS — Z79899 Other long term (current) drug therapy: Secondary | ICD-10-CM

## 2022-03-18 ENCOUNTER — Other Ambulatory Visit: Payer: Self-pay | Admitting: Family

## 2022-03-22 ENCOUNTER — Encounter: Payer: Self-pay | Admitting: Family

## 2022-03-22 ENCOUNTER — Ambulatory Visit (INDEPENDENT_AMBULATORY_CARE_PROVIDER_SITE_OTHER): Payer: Medicare HMO | Admitting: Family

## 2022-03-22 VITALS — BP 139/81 | HR 81 | Temp 97.3°F | Resp 20 | Ht 66.0 in | Wt 226.0 lb

## 2022-03-22 DIAGNOSIS — E1142 Type 2 diabetes mellitus with diabetic polyneuropathy: Secondary | ICD-10-CM | POA: Diagnosis not present

## 2022-03-22 DIAGNOSIS — M797 Fibromyalgia: Secondary | ICD-10-CM

## 2022-03-22 DIAGNOSIS — E1169 Type 2 diabetes mellitus with other specified complication: Secondary | ICD-10-CM | POA: Diagnosis not present

## 2022-03-22 MED ORDER — GABAPENTIN 100 MG PO CAPS: 100.0000 mg | ORAL_CAPSULE | Freq: Three times a day (TID) | ORAL | 3 refills | Status: DC

## 2022-03-22 NOTE — Progress Notes (Signed)
Subjective:    Patient ID: Cynthia Leon, female    DOB: [birth year], 71 y.o.   MRN: [id number]  No chief complaint on file.  Pt presents to the office today with multiple complaints today that include hands and feet swelling, "electricity like pain" in hand and feet, hair falling out, and nails fragile.   She reports her burning in bilateral feet and hands is constant of a 8 out 10.   Reports she has fibromyalgia, but his pain is different.   She has taken nervine and biotin without relief.  Diabetes She presents for her follow-up diabetic visit. She has type 2 diabetes mellitus. Associated symptoms include foot paresthesias. Pertinent negatives for diabetes include no blurred vision. Symptoms are stable. Diabetic complications include heart disease and peripheral neuropathy. Risk factors for coronary artery disease include dyslipidemia, diabetes mellitus and sedentary lifestyle. (Does not check BS at home)      Review of Systems  Eyes:  Negative for blurred vision.  All other systems reviewed and are negative.      Objective:   Physical Exam Vitals reviewed.  Constitutional:      General: She is not in acute distress.    Appearance: She is well-developed. She is obese.  HENT:     Head: Normocephalic and atraumatic.  Eyes:     Pupils: Pupils are equal, round, and reactive to light.  Neck:     Thyroid: No thyromegaly.  Cardiovascular:     Rate and Rhythm: Normal rate and regular rhythm.     Heart sounds: Normal heart sounds. No murmur heard. Pulmonary:     Effort: Pulmonary effort is normal. No respiratory distress.     Breath sounds: Normal breath sounds. No wheezing.  Abdominal:     General: Bowel sounds are normal. There is no distension.     Palpations: Abdomen is soft.     Tenderness: There is no abdominal tenderness.  Musculoskeletal:        General: No tenderness. Normal range of motion.     Cervical back: Normal range of motion and neck supple.   Skin:    General: Skin is warm and dry.  Neurological:     Mental Status: She is alert and oriented to person, place, and time.     Cranial Nerves: No cranial nerve deficit.     Deep Tendon Reflexes: Reflexes are normal and symmetric.  Psychiatric:        Behavior: Behavior normal.        Thought Content: Thought content normal.        Judgment: Judgment normal.       BP 139/81   Pulse 81   Temp (!) 97.3 F (36.3 C) (Oral)   Resp 20   Ht '5\' 6"'$  (1.676 m)   Wt 226 lb (102.5 kg)   SpO2 97%   BMI 36.48 kg/m      Assessment & Plan:  DELROSE ROHWER comes in today with chief complaint of No chief complaint on file.   Diagnosis and orders addressed:  1. Type 2 diabetes mellitus with other specified complication, without long-term current use of insulin ([place])  2. Morbid obesity ([place])  3. Fibromyalgia  4. Diabetic polyneuropathy associated with type 2 diabetes mellitus ([facility]) Start gabapentin 100 mg TID prn  Keep good control of DM - gabapentin (NEURONTIN) 100 MG capsule; Take 1 capsule (100 mg total) by mouth 3 (three) times daily.  Dispense: 90 capsule; Refill: 3    Health  Maintenance reviewed Diet and exercise encouraged  Follow up plan: Keep chronic follow up   Evelina Dun, FNP

## 2022-03-22 NOTE — Patient Instructions (Signed)
Neuropathic Pain Neuropathic pain is pain caused by damage to the nerves that are responsible for certain sensations in your body (sensory nerves). Neuropathic pain can make you more sensitive to pain. Even a minor sensation can feel very painful. This is usually a long-term (chronic) condition that can be difficult to treat. The type of pain differs from person to person. It may: Start suddenly (acute), or it may develop slowly and become chronic. Come and go as damaged nerves heal, or it may stay at the same level for years. Cause emotional distress, loss of sleep, and a lower quality of life. What are the causes? The most common cause of this condition is diabetes. Many other diseases and conditions can also cause neuropathic pain. Causes of neuropathic pain can be classified as: Toxic. This is caused by medicines and chemicals. The most common causes of toxic neuropathic pain is damage from medicines that kill cancer cells (chemotherapy) or alcohol abuse. Metabolic. This can be caused by: Diabetes. Lack of vitamins like B12. Traumatic. Any injury that cuts, crushes, or stretches a nerve can cause damage and pain. Compression-related. If a sensory nerve gets trapped or compressed for a long period of time, the blood supply to the nerve can be cut off. Vascular. Many blood vessel diseases can cause neuropathic pain by decreasing blood supply and oxygen to nerves. Autoimmune. This type of pain results from diseases in which the body's defense system (immune system) mistakenly attacks sensory nerves. Examples of autoimmune diseases that can cause neuropathic pain include lupus and multiple sclerosis. Infectious. Many types of viral infections can damage sensory nerves and cause pain. Shingles infection is a common cause of this type of pain. Inherited. Neuropathic pain can be a symptom of many diseases that are passed down through families (genetic). What increases the risk? You are more likely to  develop this condition if: You have diabetes. You smoke. You drink too much alcohol. You are taking certain medicines, including chemotherapy or medicines that treat immune system disorders. What are the signs or symptoms? The main symptom is pain. Neuropathic pain is often described as: Burning. Shock-like. Stinging. Hot or cold. Itching. How is this diagnosed? No single test can diagnose neuropathic pain. It is diagnosed based on: A physical exam and your symptoms. Your health care provider will ask you about your pain. You may be asked to use a pain scale to describe how bad your pain is. Tests. These may be done to see if you have a cause and location of any nerve damage. They include: Nerve conduction studies and electromyography to test how well nerve signals travel through your nerves and muscles (electrodiagnostic testing). Skin biopsy to evaluate for small fiber neuropathy. Imaging studies, such as: X-rays. CT scan. MRI. How is this treated? Treatment for neuropathic pain may change over time. You may need to try different treatment options or a combination of treatments. Some options include: Treating the underlying cause of the neuropathy, such as diabetes, kidney disease, or vitamin deficiencies. Stopping medicines that can cause neuropathy, such as chemotherapy. Medicine to relieve pain. Medicines may include: Prescription or over-the-counter pain medicine. Anti-seizure medicine. Antidepressant medicines. Pain-relieving patches or creams that are applied to painful areas of skin. A medicine to numb the area (local anesthetic), which can be injected as a nerve block. Transcutaneous nerve stimulation. This uses electrical currents to block painful nerve signals. The treatment is painless. Alternative treatments, such as: Acupuncture. Meditation. Massage. Occupational or physical therapy. Pain management programs. Counseling. Follow   these instructions at  home: Medicines  Take over-the-counter and prescription medicines only as told by your health care provider. Ask your health care provider if the medicine prescribed to you: Requires you to avoid driving or using machinery. Can cause constipation. You may need to take these actions to prevent or treat constipation: Drink enough fluid to keep your urine pale yellow. Take over-the-counter or prescription medicines. Eat foods that are high in fiber, such as beans, whole grains, and fresh fruits and vegetables. Limit foods that are high in fat and processed sugars, such as fried or sweet foods. Lifestyle  Have a good support system at home. Consider joining a chronic pain support group. Do not use any products that contain nicotine or tobacco. These products include cigarettes, chewing tobacco, and vaping devices, such as e-cigarettes. If you need help quitting, ask your health care provider. Do not drink alcohol. General instructions Learn as much as you can about your condition. Work closely with all your health care providers to find the treatment plan that works best for you. Ask your health care provider what activities are safe for you. Keep all follow-up visits. This is important. Contact a health care provider if: Your pain treatments are not working. You are having side effects from your medicines. You are struggling with tiredness (fatigue), mood changes, depression, or anxiety. Get help right away if: You have thoughts of hurting yourself. Get help right away if you feel like you may hurt yourself or others, or have thoughts about taking your own life. Go to your nearest emergency room or: Call 911. Call the National Suicide Prevention Lifeline at 1-800-273-8255 or 988. This is open 24 hours a day. Text the Crisis Text Line at 741741. Summary Neuropathic pain is pain caused by damage to the nerves that are responsible for certain sensations in your body (sensory  nerves). Neuropathic pain may come and go as damaged nerves heal, or it may stay at the same level for years. Neuropathic pain is usually a long-term condition that can be difficult to treat. Consider joining a chronic pain support group. This information is not intended to replace advice given to you by your health care provider. Make sure you discuss any questions you have with your health care provider. Document Revised: 03/12/2021 Document Reviewed: 03/12/2021 Elsevier Patient Education  2023 Elsevier Inc.  

## 2022-04-04 ENCOUNTER — Other Ambulatory Visit: Payer: Self-pay | Admitting: Family

## 2022-04-04 DIAGNOSIS — E785 Hyperlipidemia, unspecified: Secondary | ICD-10-CM

## 2022-04-04 MED ORDER — GABAPENTIN 300 MG PO CAPS: 300.0000 mg | ORAL_CAPSULE | Freq: Three times a day (TID) | ORAL | 3 refills | Status: DC

## 2022-04-05 ENCOUNTER — Other Ambulatory Visit: Payer: Self-pay | Admitting: Family

## 2022-04-05 DIAGNOSIS — M797 Fibromyalgia: Secondary | ICD-10-CM

## 2022-04-05 DIAGNOSIS — F411 Generalized anxiety disorder: Secondary | ICD-10-CM

## 2022-04-10 ENCOUNTER — Telehealth: Payer: Self-pay | Admitting: Family

## 2022-04-10 NOTE — Telephone Encounter (Signed)
REFERRAL REQUEST Telephone Note  Have you been seen at our office for this problem? NO (Advise that they may need an appointment with their PCP before a referral can be done)  Reason for Referral: Medicine not working for Neuropathy Referral discussed with patient: YES but nothing was done  Best contact number of patient for referral team: [phone]    Has patient been seen by a specialist for this issue before: NO  Patient provider preference for referral: Vern[facility] Emerald  Patient location preference for referral: [facility]   Patient notified that referrals can take up to a week or longer to process. If they haven't heard anything within a week they should call back and speak with the referral department.

## 2022-04-11 MED ORDER — GABAPENTIN 600 MG PO TABS: 600.0000 mg | ORAL_TABLET | Freq: Three times a day (TID) | ORAL | 1 refills | Status: DC

## 2022-04-11 NOTE — Telephone Encounter (Signed)
Dr. Benjamine Mola is a Rheumatologist. They do not deal with neuropathy. I have increased gabapentin to 600 mg TID from 300 mg TID.

## 2022-04-11 NOTE — Telephone Encounter (Signed)
PT AWARE  

## 2022-04-12 ENCOUNTER — Telehealth: Payer: Self-pay | Admitting: Family

## 2022-04-12 DIAGNOSIS — E1142 Type 2 diabetes mellitus with diabetic polyneuropathy: Secondary | ICD-10-CM

## 2022-04-12 NOTE — Telephone Encounter (Signed)
REFERRAL REQUEST Telephone Note  Have you been seen at our office for this problem? Daughter is asking that pt see someone for neuropathy and not just take medication for the neuropathy pain.   (Advise that they may need an appointment with their PCP before a referral can be done)  Reason for Referral: neuropathy Referral discussed with patient: no pt last ov 03/22/2022 Best contact number of patient for referral team: daughter Cynthia Leon called -she is on the [facility] as [name] but goes by [name]  Has patient been seen by a specialist for this issue before: no  Patient provider preference for referral: [place] or [facility] Patient location preference for referral: [place] or [facility]    Patient notified that referrals can take up to a week or longer to process. If they haven't heard anything within a week they should call back and speak with the referral department.

## 2022-04-12 NOTE — Telephone Encounter (Signed)
Referral to Neurologists placed.

## 2022-04-12 NOTE — Telephone Encounter (Signed)
Patient aware and verbalized understanding. °

## 2022-05-28 ENCOUNTER — Ambulatory Visit: Payer: Medicare HMO | Admitting: Diagnostic Neuroimaging

## 2022-05-28 ENCOUNTER — Encounter: Payer: Self-pay | Admitting: Diagnostic Neuroimaging

## 2022-05-28 VITALS — BP 121/72 | HR 75 | Ht 64.0 in | Wt 228.2 lb

## 2022-05-28 DIAGNOSIS — G894 Chronic pain syndrome: Secondary | ICD-10-CM | POA: Diagnosis not present

## 2022-05-28 NOTE — Patient Instructions (Signed)
CHRONIC PAIN, NUMBNESS, NECK PAIN, BACK PAIN, NUMBNESS - consider pain mgmt clinic (tried and failed lyrica, gabapentin, duloxetine, ibuprofen, tylenol) - optimize nutrition, exercise, sleep and stress mgmt  SWELLING IN FINGERS, NODULES IN FINGERS, PAIN IN JOINTS - consider rheumatology clinic

## 2022-05-28 NOTE — Progress Notes (Signed)
[facility]  PATIENT: Cynthia Leon DOB: [birth year]  REFERRING CLINICIAN: Evelina Dun A, FNP HISTORY FROM: PATIENT REASON FOR VISIT: NEW CONSULT   HISTORICAL  CHIEF COMPLAINT:  Chief Complaint  Patient presents with   Polyneuropathy    Rm 6 New Pt  dgtr- Renee  ""both hands get numb, swell; sharp nerves in my legs x 2-3 months; gabapentin caused me to be restless, anxious and irritable"    HISTORY OF PRESENT ILLNESS:   71 year old female here for evaluation of numbness and pain.  Patient has had chronic pain in neck, back, arms and legs since 2008.  At that time she fell down from a ladder and had significant injury.  She also developed shingles on her left neck and left arm later that year.  She has been dealing with chronic pain since that time she.  She is seen several specialist in the past including spine doctors, pain doctors and neurologist.  She has been diagnosed with fibromyalgia.  Lately she is concerned about pain and numbness in her fingers and hands.  She feels swelling in her fingers, hands and legs.  She has noted some "nodules" in her fingers.  She has tried a variety of pain medications without relief.  She does have history of physical and emotional traumas in the past.  Has long history of anxiety and depression.   REVIEW OF SYSTEMS: Full 14 system review of systems performed and negative with exception of: As per HPI.  ALLERGIES: Allergies  Allergen Reactions   Mobic [Meloxicam] Nausea And Vomiting   Asa [Aspirin] Nausea And Vomiting   Gabapentin Anxiety    Restless, couldn't sleep    HOME MEDICATIONS: Outpatient Medications Prior to Visit  Medication Sig Dispense Refill   ALPRAZolam (XANAX) 0.5 MG tablet Take 1 tablet (0.5 mg total) by mouth at bedtime as needed for anxiety. 30 tablet 2   Calcium & Magnesium Carbonates (MYLANTA PO) Take by mouth as needed.     cetirizine (ZYRTEC) 10 MG tablet TAKE 1 TABLET BY MOUTH  EVERY DAY 90 tablet 3   cyclobenzaprine (FLEXERIL) 10 MG tablet TAKE 1 TABLET BY MOUTH THREE TIMES A DAY AS NEEDED FOR MUSCLE SPASMS 90 tablet 1   DULoxetine (CYMBALTA) 60 MG capsule TAKE 1 CAPSULE BY MOUTH EVERY DAY 90 capsule 0   fluticasone (FLONASE) 50 MCG/ACT nasal spray USE 2 SPRAYS IN EACH NOSTRIL ONCE DAILY 16 g 4   Omeprazole-Sodium Bicarbonate (ZEGERID OTC) 20-1100 MG CAPS capsule Take 1 capsule by mouth daily before breakfast. 28 each 0   rosuvastatin (CRESTOR) 20 MG tablet TAKE 1 TABLET BY MOUTH EVERY DAY 90 tablet 0   topiramate (TOPAMAX) 50 MG tablet TAKE 1 TABLET BY MOUTH TWICE A DAY 180 tablet 0   gabapentin (NEURONTIN) 600 MG tablet Take 1 tablet (600 mg total) by mouth 3 (three) times daily. (Patient not taking: Reported on 05/28/2022) 90 tablet 1   No facility-administered medications prior to visit.    PAST MEDICAL HISTORY: Past Medical History:  Diagnosis Date   Abdominal pain    Allergic rhinitis    Anxiety    Constipation    Fibromyalgia    GERD (gastroesophageal reflux disease)    Head ache    Hyperlipidemia    Osteopenia    PUD (peptic ulcer disease)    Right hip pain    Shingles     PAST SURGICAL HISTORY: Past Surgical History:  Procedure Laterality Date   COLONOSCOPY N/A 12/13/2015  Procedure: COLONOSCOPY;  Surgeon: Rogene Houston, MD;  Location: [facility];  Service: Endoscopy;  Laterality: N/A;  39   TOTAL HIP ARTHROPLASTY Right     FAMILY HISTORY: Family History  Problem Relation Age of Onset   Breast cancer Mother    Coronary artery disease Mother    Cancer Mother        breast   Emphysema Father    COPD Father    Hypertension Father    Heart disease Father    Diabetes Sister    Cancer Sister        throat   Coronary artery disease Sister    Diabetes Sister    Esophageal cancer Sister    Cancer Brother        brain   Cancer Brother        bone   Cancer Brother        lung   Cancer Brother    Lupus Brother    Healthy  Daughter    Healthy Daughter    Healthy Daughter    Healthy Daughter     SOCIAL HISTORY: Social History   Socioeconomic History   Marital status: Married    Spouse name: Mikeal Hawthorne   Number of children: 3   Years of education: 11   Highest education level: 11th grade  Occupational History   Occupation: disabled   Tobacco Use   Smoking status: Never    Passive exposure: Yes   Smokeless tobacco: Never  Vaping Use   Vaping Use: Never used  Substance and Sexual Activity   Alcohol use: No   Drug use: No   Sexual activity: Yes  Other Topics Concern   Not on file  Social History Narrative   Lives with her husband, their 3 daughters all live nearby   Social Determinants of Health   Financial Resource Strain: [place]  (12/03/2021)   Overall Financial Resource Strain (CARDIA)    Difficulty of Paying Living Expenses: Not hard at all  Food Insecurity: No Food Insecurity (12/03/2021)   Hunger Vital Sign    Worried About Running Out of Food in the Last Year: Never true    Ran Out of Food in the Last Year: Never true  Transportation Needs: No Transportation Needs (12/03/2021)   PRAPARE - Hydrologist (Medical): No    Lack of Transportation (Non-Medical): No  Physical Activity: Insufficiently Active (12/03/2021)   Exercise Vital Sign    Days of Exercise per Week: 5 days    Minutes of Exercise per Session: 20 min  Stress: No Stress Concern Present (12/03/2021)   [place]    Feeling of Stress : Only a little  Social Connections: Moderately Isolated (12/03/2021)   Social Connection and Isolation Panel [NHANES]    Frequency of Communication with Friends and Family: More than three times a week    Frequency of Social Gatherings with Friends and Family: More than three times a week    Attends Religious Services: Never    Marine scientist or Organizations: No    Attends Archivist  Meetings: Never    Marital Status: Married  Human resources officer Violence: Not At Risk (12/03/2021)   Humiliation, Afraid, Rape, and Kick questionnaire    Fear of Current or Ex-Partner: No    Emotionally Abused: No    Physically Abused: No    Sexually Abused: No     PHYSICAL  EXAM  GENERAL EXAM/CONSTITUTIONAL: Vitals:  Vitals:   05/28/22 1043  BP: 121/72  Pulse: 75  Weight: 228 lb 3.2 oz (103.5 kg)  Height: '5\' 4"'$  (1.626 m)   Body mass index is 39.17 kg/m. Wt Readings from Last 3 Encounters:  05/28/22 228 lb 3.2 oz (103.5 kg)  03/22/22 226 lb (102.5 kg)  01/24/22 222 lb 9.6 oz (101 kg)   Patient is in no distress; well developed, nourished and groomed; neck is supple  CARDIOVASCULAR: Examination of carotid arteries is normal; no carotid bruits Regular rate and rhythm, no murmurs Examination of peripheral vascular system by observation and palpation is normal  EYES: Ophthalmoscopic exam of optic discs and posterior segments is normal; no papilledema or hemorrhages No results found.  MUSCULOSKELETAL: Gait, strength, tone, movements noted in Neurologic exam below  NEUROLOGIC: MENTAL STATUS:     11/26/2017    9:17 AM 11/25/2016   10:47 AM  MMSE - Mini Mental State Exam  Orientation to time 5 5  Orientation to Place 5 5  Registration 3 2  Registration-comments  HAD TO REPEAT FOR PATIENT  Attention/ Calculation 5 5  Recall 2 3  Language- name 2 objects 2 2  Language- repeat 1 1  Language- follow 3 step command 3 3  Language- read & follow direction 1 1  Write a sentence 1 1  Copy design 1 1  Total score 29 29   awake, alert, oriented to person, place and time recent and remote memory intact normal attention and concentration language fluent, comprehension intact, naming intact fund of knowledge appropriate  CRANIAL NERVE:  2nd - no papilledema on fundoscopic exam 2nd, 3rd, 4th, 6th - pupils equal and reactive to light, visual fields full to confrontation,  extraocular muscles intact, no nystagmus 5th - facial sensation symmetric 7th - facial strength symmetric 8th - hearing intact 9th - palate elevates symmetrically, uvula midline 11th - shoulder shrug symmetric 12th - tongue protrusion midline  MOTOR:  normal bulk and tone, full strength in the BUE, BLE  SENSORY:  normal and symmetric to light touch, temperature, vibration;EXCEPT SLIGHTLY REDUCED IN FEET  COORDINATION:  finger-nose-finger, fine finger movements normal  REFLEXES:  deep tendon reflexes TRACE and symmetric  GAIT/STATION:  narrow based gait     DIAGNOSTIC DATA (LABS, IMAGING, TESTING) - I reviewed patient records, labs, notes, testing and imaging myself where available.  Lab Results  Component Value Date   WBC 9.3 01/24/2022   HGB 14.2 01/24/2022   HCT 42.3 01/24/2022   MCV 86 01/24/2022   PLT 297 01/24/2022      Component Value Date/Time   NA 140 01/24/2022 0949   K 4.3 01/24/2022 0949   CL 102 01/24/2022 0949   CO2 23 01/24/2022 0949   GLUCOSE 115 (H) 01/24/2022 0949   GLUCOSE 98 02/17/2013 1831   BUN 6 (L) 01/24/2022 0949   CREATININE 0.90 01/24/2022 0949   CREATININE 0.85 02/17/2013 1831   CALCIUM 10.1 01/24/2022 0949   PROT 7.0 01/24/2022 0949   ALBUMIN 4.4 01/24/2022 0949   AST 15 01/24/2022 0949   ALT 16 01/24/2022 0949   ALKPHOS 90 01/24/2022 0949   BILITOT 0.3 01/24/2022 0949   GFRNONAA 83 04/06/2020 1049   GFRNONAA 74 02/17/2013 1831   GFRAA 96 04/06/2020 1049   GFRAA 85 02/17/2013 1831   Lab Results  Component Value Date   CHOL 147 01/24/2022   HDL 48 01/24/2022   LDLCALC 72 01/24/2022   TRIG 161 (H) 01/24/2022  CHOLHDL 3.1 01/24/2022   Lab Results  Component Value Date   HGBA1C 6.0 (H) 01/24/2022   Lab Results  Component Value Date   RFXJOITG54 982 06/16/2017   Lab Results  Component Value Date   TSH 2.120 01/24/2022      ASSESSMENT AND PLAN  71 y.o. year old female here withl   Dx:  1. Chronic pain  syndrome      PLAN:  CHRONIC PAIN, NUMBNESS, NECK PAIN, BACK PAIN, NUMBNESS (previous physical and emotional traumas) - consider pain mgmt clinic (tried and failed lyrica, gabapentin, duloxetine, ibuprofen, tylenol) - optimize nutrition, exercise, sleep and stress mgmt - consider PT / OT evaluation or [employer] / exercise classes  SWELLING IN FINGERS, NODULES IN FINGERS, PAIN IN JOINTS - consider rheumatology clinic  Return for return to PCP, pending if symptoms worsen or fail to improve.    Penni Bombard, MD 64/15/8309, 40:76 AM Certified in Neurology, Neurophysiology and Neuroimaging  [facility] [address] [place], [place] [address] [phone]

## 2022-06-16 ENCOUNTER — Other Ambulatory Visit: Payer: Self-pay | Admitting: Family

## 2022-06-26 ENCOUNTER — Other Ambulatory Visit: Payer: Self-pay | Admitting: Family

## 2022-06-26 DIAGNOSIS — F411 Generalized anxiety disorder: Secondary | ICD-10-CM

## 2022-06-26 DIAGNOSIS — Z79899 Other long term (current) drug therapy: Secondary | ICD-10-CM

## 2022-06-26 DIAGNOSIS — F1320 Sedative, hypnotic or anxiolytic dependence, uncomplicated: Secondary | ICD-10-CM

## 2022-06-30 ENCOUNTER — Other Ambulatory Visit: Payer: Self-pay | Admitting: Family

## 2022-06-30 DIAGNOSIS — F411 Generalized anxiety disorder: Secondary | ICD-10-CM

## 2022-06-30 DIAGNOSIS — M797 Fibromyalgia: Secondary | ICD-10-CM

## 2022-06-30 DIAGNOSIS — E785 Hyperlipidemia, unspecified: Secondary | ICD-10-CM

## 2022-07-01 ENCOUNTER — Other Ambulatory Visit: Payer: Self-pay | Admitting: Family

## 2022-07-01 DIAGNOSIS — F1320 Sedative, hypnotic or anxiolytic dependence, uncomplicated: Secondary | ICD-10-CM

## 2022-07-01 DIAGNOSIS — Z79899 Other long term (current) drug therapy: Secondary | ICD-10-CM

## 2022-07-01 DIAGNOSIS — F411 Generalized anxiety disorder: Secondary | ICD-10-CM

## 2022-07-01 NOTE — Telephone Encounter (Signed)
Please advise on refill. Last OV & RF 01/24/22 RTC 3 mos, pt was seen next on 03/22/22, Next OV is scheduled for 08/05/22

## 2022-07-01 NOTE — Telephone Encounter (Signed)
  Prescription Request  07/01/2022  Is this a "Controlled Substance" medicine? yes  Have you seen your PCP in the last 2 weeks? Appt made for first available 1/8 and added to wait list   If YES, route message to pool  -  If NO, patient needs to be scheduled for appointment.  What is the name of the medication or equipment? ALPRAZolam (XANAX) 0.5 MG tablet   Have you contacted your pharmacy to request a refill? yes   Which pharmacy would you like this sent to?  [id number] [place](Ph: [phone]      Patient notified that their request is being sent to the clinical staff for review and that they should receive a response within 2 business days.

## 2022-07-02 MED ORDER — ALPRAZOLAM 0.5 MG PO TABS: 0.5000 mg | ORAL_TABLET | Freq: Every evening | ORAL | 1 refills | Status: DC | PRN

## 2022-07-02 NOTE — Telephone Encounter (Signed)
Pt aware refill sent to pharmacy 

## 2022-07-15 ENCOUNTER — Other Ambulatory Visit: Payer: Self-pay | Admitting: Family

## 2022-07-19 ENCOUNTER — Other Ambulatory Visit: Payer: Self-pay | Admitting: Family

## 2022-07-19 NOTE — Telephone Encounter (Signed)
Refill approved, pt scheduled appt with PCP 08/05/22.

## 2022-08-05 ENCOUNTER — Ambulatory Visit (INDEPENDENT_AMBULATORY_CARE_PROVIDER_SITE_OTHER): Payer: Medicare HMO | Admitting: Family

## 2022-08-05 ENCOUNTER — Encounter: Payer: Self-pay | Admitting: Family

## 2022-08-05 VITALS — BP 108/65 | HR 83 | Temp 98.3°F | Ht 64.0 in | Wt 223.0 lb

## 2022-08-05 DIAGNOSIS — Z6838 Body mass index (BMI) 38.0-38.9, adult: Secondary | ICD-10-CM

## 2022-08-05 DIAGNOSIS — E785 Hyperlipidemia, unspecified: Secondary | ICD-10-CM | POA: Diagnosis not present

## 2022-08-05 DIAGNOSIS — E1142 Type 2 diabetes mellitus with diabetic polyneuropathy: Secondary | ICD-10-CM | POA: Diagnosis not present

## 2022-08-05 DIAGNOSIS — M159 Polyosteoarthritis, unspecified: Secondary | ICD-10-CM | POA: Diagnosis not present

## 2022-08-05 DIAGNOSIS — K5900 Constipation, unspecified: Secondary | ICD-10-CM

## 2022-08-05 DIAGNOSIS — K219 Gastro-esophageal reflux disease without esophagitis: Secondary | ICD-10-CM | POA: Diagnosis not present

## 2022-08-05 DIAGNOSIS — Z0000 Encounter for general adult medical examination without abnormal findings: Secondary | ICD-10-CM

## 2022-08-05 DIAGNOSIS — E559 Vitamin D deficiency, unspecified: Secondary | ICD-10-CM

## 2022-08-05 DIAGNOSIS — E1169 Type 2 diabetes mellitus with other specified complication: Secondary | ICD-10-CM | POA: Diagnosis not present

## 2022-08-05 DIAGNOSIS — Z79899 Other long term (current) drug therapy: Secondary | ICD-10-CM | POA: Diagnosis not present

## 2022-08-05 DIAGNOSIS — F1320 Sedative, hypnotic or anxiolytic dependence, uncomplicated: Secondary | ICD-10-CM

## 2022-08-05 DIAGNOSIS — Z0001 Encounter for general adult medical examination with abnormal findings: Secondary | ICD-10-CM | POA: Diagnosis not present

## 2022-08-05 DIAGNOSIS — R69 Illness, unspecified: Secondary | ICD-10-CM | POA: Diagnosis not present

## 2022-08-05 DIAGNOSIS — F411 Generalized anxiety disorder: Secondary | ICD-10-CM

## 2022-08-05 LAB — BAYER DCA HB A1C WAIVED: HB A1C (BAYER DCA - WAIVED): 6.8 % — ABNORMAL HIGH (ref 4.8–5.6)

## 2022-08-05 MED ORDER — ALPRAZOLAM 0.5 MG PO TABS: 0.5000 mg | ORAL_TABLET | Freq: Every evening | ORAL | 2 refills | Status: DC | PRN

## 2022-08-05 NOTE — Progress Notes (Signed)
Subjective:    Patient ID: Cynthia Leon, female    DOB: 10/21/1950, 72 y.o.   MRN: [id number]  Chief Complaint  Patient presents with   Medical Management of Chronic Issues   Pt presents to the office today for CPE and  chronic follow up. Pt is morbid obese today with a BMI of 38 and co-moribility of DM and GERD.   Gastroesophageal Reflux She complains of belching and heartburn. This is a chronic problem. The current episode started more than 1 year ago. The problem occurs occasionally. Risk factors include obesity. She has tried a PPI for the symptoms. The treatment provided moderate relief.  Diabetes She presents for her follow-up diabetic visit. She has type 2 diabetes mellitus. Hypoglycemia symptoms include nervousness/anxiousness. Associated symptoms include foot paresthesias. Pertinent negatives for diabetes include no blurred vision. Symptoms are stable. Diabetic complications include peripheral neuropathy. Risk factors for coronary artery disease include diabetes mellitus, dyslipidemia, hypertension and sedentary lifestyle. She is following a generally healthy diet. Her overall blood glucose range is 110-130 mg/dl. Eye exam is current.  Arthritis Presents for follow-up visit. She complains of pain and stiffness. Affected locations include the left knee, right knee, right MCP, left MCP, right shoulder and left shoulder. Her pain is at a severity of 8/10.  Hyperlipidemia This is a chronic problem. The current episode started more than 1 year ago. The problem is controlled. Exacerbating diseases include obesity. Current antihyperlipidemic treatment includes statins. The current treatment provides moderate improvement of lipids. Risk factors for coronary artery disease include dyslipidemia, hypertension, a sedentary lifestyle and post-menopausal.  Anxiety Presents for follow-up visit. Symptoms include depressed mood, excessive worry, irritability, nervous/anxious behavior and  restlessness. The severity of symptoms is moderate.    Constipation This is a chronic problem. The current episode started more than 1 year ago. The problem has been waxing and waning since onset. Her stool frequency is 1 time per day. Risk factors include obesity. She has tried stool softeners for the symptoms. The treatment provided moderate relief.      Review of Systems  Constitutional:  Positive for irritability.  Eyes:  Negative for blurred vision.  Gastrointestinal:  Positive for constipation and heartburn.  Musculoskeletal:  Positive for arthritis and stiffness.  Psychiatric/Behavioral:  The patient is nervous/anxious.   All other systems reviewed and are negative.  Family History  Problem Relation Age of Onset   Breast cancer Mother    Coronary artery disease Mother    Cancer Mother        breast   Emphysema Father    COPD Father    Hypertension Father    Heart disease Father    Diabetes Sister    Cancer Sister        throat   Coronary artery disease Sister    Diabetes Sister    Esophageal cancer Sister    Cancer Brother        brain   Cancer Brother        bone   Cancer Brother        lung   Cancer Brother    Lupus Brother    Healthy Daughter    Healthy Daughter    Healthy Daughter    Healthy Daughter    Social History   Socioeconomic History   Marital status: Married    Spouse name: Mikeal Hawthorne   Number of children: 3   Years of education: 11   Highest education level: 11th grade  Occupational History  Occupation: disabled   Tobacco Use   Smoking status: Never    Passive exposure: Yes   Smokeless tobacco: Never  Vaping Use   Vaping Use: Never used  Substance and Sexual Activity   Alcohol use: No   Drug use: No   Sexual activity: Yes  Other Topics Concern   Not on file  Social History Narrative   Lives with her husband, their 3 daughters all live nearby   Social Determinants of Health   Financial Resource Strain: [place]  (12/03/2021)    Overall Financial Resource Strain (CARDIA)    Difficulty of Paying Living Expenses: Not hard at all  Food Insecurity: No Food Insecurity (12/03/2021)   Hunger Vital Sign    Worried About Running Out of Food in the Last Year: Never true    Ran Out of Food in the Last Year: Never true  Transportation Needs: No Transportation Needs (12/03/2021)   PRAPARE - Hydrologist (Medical): No    Lack of Transportation (Non-Medical): No  Physical Activity: Insufficiently Active (12/03/2021)   Exercise Vital Sign    Days of Exercise per Week: 5 days    Minutes of Exercise per Session: 20 min  Stress: No Stress Concern Present (12/03/2021)   [place]    Feeling of Stress : Only a little  Social Connections: Moderately Isolated (12/03/2021)   Social Connection and Isolation Panel [NHANES]    Frequency of Communication with Friends and Family: More than three times a week    Frequency of Social Gatherings with Friends and Family: More than three times a week    Attends Religious Services: Never    Marine scientist or Organizations: No    Attends Archivist Meetings: Never    Marital Status: Married       Objective:   Physical Exam Vitals reviewed.  Constitutional:      General: She is not in acute distress.    Appearance: She is well-developed. She is obese.  HENT:     Head: Normocephalic and atraumatic.     Right Ear: Tympanic membrane normal.     Left Ear: Tympanic membrane normal.  Eyes:     Pupils: Pupils are equal, round, and reactive to light.  Neck:     Thyroid: No thyromegaly.  Cardiovascular:     Rate and Rhythm: Normal rate and regular rhythm.     Heart sounds: Normal heart sounds. No murmur heard. Pulmonary:     Effort: Pulmonary effort is normal. No respiratory distress.     Breath sounds: Normal breath sounds. No wheezing.  Abdominal:     General: Bowel sounds are normal.  There is no distension.     Palpations: Abdomen is soft.     Tenderness: There is no abdominal tenderness.  Musculoskeletal:        General: No tenderness. Normal range of motion.     Cervical back: Normal range of motion and neck supple.  Skin:    General: Skin is warm and dry.  Neurological:     Mental Status: She is alert and oriented to person, place, and time.     Cranial Nerves: No cranial nerve deficit.     Deep Tendon Reflexes: Reflexes are normal and symmetric.  Psychiatric:        Mood and Affect: Mood is anxious.        Behavior: Behavior normal.  Thought Content: Thought content normal.        Judgment: Judgment normal.       BP 108/65   Pulse 83   Temp 98.3 F (36.8 C)   Ht '5\' 4"'$  (1.626 m)   Wt 223 lb (101.2 kg)   SpO2 95%   BMI 38.28 kg/m      Assessment & Plan:  Cynthia Leon comes in today with chief complaint of Medical Management of Chronic Issues   Diagnosis and orders addressed:  1. Type 2 diabetes mellitus with other specified complication, without long-term current use of insulin (HCC) - Bayer DCA Hb A1c Waived - CMP14+EGFR - CBC with Differential/Platelet - Microalbumin / creatinine urine ratio  2. Diabetic polyneuropathy associated with type 2 diabetes mellitus (HCC) - CMP14+EGFR - CBC with Differential/Platelet  3. Controlled substance agreement signed - ALPRAZolam (XANAX) 0.5 MG tablet; Take 1 tablet (0.5 mg total) by mouth at bedtime as needed for anxiety.  Dispense: 30 tablet; Refill: 2 - CMP14+EGFR - CBC with Differential/Platelet  4. Constipation, unspecified constipation type - CMP14+EGFR - CBC with Differential/Platelet  5. Benzodiazepine dependence (HCC) - ALPRAZolam (XANAX) 0.5 MG tablet; Take 1 tablet (0.5 mg total) by mouth at bedtime as needed for anxiety.  Dispense: 30 tablet; Refill: 2 - CMP14+EGFR - CBC with Differential/Platelet  6. GAD (generalized anxiety disorder) - ALPRAZolam (XANAX) 0.5 MG  tablet; Take 1 tablet (0.5 mg total) by mouth at bedtime as needed for anxiety.  Dispense: 30 tablet; Refill: 2 - CMP14+EGFR - CBC with Differential/Platelet  7. Gastroesophageal reflux disease, unspecified whether esophagitis present - CMP14+EGFR - CBC with Differential/Platelet  8. Hyperlipidemia, unspecified hyperlipidemia type - CMP14+EGFR - CBC with Differential/Platelet - Lipid panel  9. Morbid obesity ([place]) - CMP14+EGFR - CBC with Differential/Platelet  10. Primary osteoarthritis involving multiple joints - CMP14+EGFR - CBC with Differential/Platelet  11. Vitamin D deficiency - CMP14+EGFR - CBC with Differential/Platelet - VITAMIN D 25 Hydroxy (Vit-D Deficiency, Fractures)  12. Annual physical exam - Bayer DCA Hb A1c Waived - CMP14+EGFR - CBC with Differential/Platelet - Microalbumin / creatinine urine ratio - Lipid panel - TSH   Labs pending Patient reviewed in [place] controlled database, no flags noted. Contract and drug screen are up to date.  Health Maintenance reviewed Diet and exercise encouraged  Follow up plan: 3 months   Evelina Dun, FNP

## 2022-08-05 NOTE — Patient Instructions (Signed)

## 2022-08-06 LAB — CBC WITH DIFFERENTIAL/PLATELET
Basophils Absolute: 0.1 10*3/uL (ref 0.0–0.2)
Basos: 1 %
EOS (ABSOLUTE): 0.3 10*3/uL (ref 0.0–0.4)
Eos: 3 %
Hematocrit: 43.4 % (ref 34.0–46.6)
Hemoglobin: 14.1 g/dL (ref 11.1–15.9)
Immature Grans (Abs): 0 10*3/uL (ref 0.0–0.1)
Immature Granulocytes: 0 %
Lymphocytes Absolute: 2.3 10*3/uL (ref 0.7–3.1)
Lymphs: 30 %
MCH: 28.1 pg (ref 26.6–33.0)
MCHC: 32.5 g/dL (ref 31.5–35.7)
MCV: 87 fL (ref 79–97)
Monocytes Absolute: 0.6 10*3/uL (ref 0.1–0.9)
Monocytes: 8 %
Neutrophils Absolute: 4.4 10*3/uL (ref 1.4–7.0)
Neutrophils: 58 %
Platelets: 295 10*3/uL (ref 150–450)
RBC: 5.02 x10E6/uL (ref 3.77–5.28)
RDW: 13.2 % (ref 11.7–15.4)
WBC: 7.6 10*3/uL (ref 3.4–10.8)

## 2022-08-06 LAB — MICROALBUMIN / CREATININE URINE RATIO
Creatinine, Urine: 30.4 mg/dL
Microalb/Creat Ratio: 11 mg/g creat (ref 0–29)
Microalbumin, Urine: 3.4 ug/mL

## 2022-08-06 LAB — LIPID PANEL
Chol/HDL Ratio: 3.3 ratio (ref 0.0–4.4)
Cholesterol, Total: 167 mg/dL (ref 100–199)
HDL: 51 mg/dL (ref >39)
LDL Chol Calc (NIH): 86 mg/dL (ref 0–99)
Triglycerides: 178 mg/dL — ABNORMAL HIGH (ref 0–149)
VLDL Cholesterol Cal: 30 mg/dL (ref 5–40)

## 2022-08-06 LAB — CMP14+EGFR
ALT: 14 IU/L (ref 0–32)
AST: 12 IU/L (ref 0–40)
Albumin/Globulin Ratio: 2 (ref 1.2–2.2)
Albumin: 4.5 g/dL (ref 3.8–4.8)
Alkaline Phosphatase: 93 IU/L (ref 44–121)
BUN/Creatinine Ratio: 9 — ABNORMAL LOW (ref 12–28)
BUN: 7 mg/dL — ABNORMAL LOW (ref 8–27)
Bilirubin Total: 0.3 mg/dL (ref 0.0–1.2)
CO2: 23 mmol/L (ref 20–29)
Calcium: 10.2 mg/dL (ref 8.7–10.3)
Chloride: 102 mmol/L (ref 96–106)
Creatinine, Ser: 0.77 mg/dL (ref 0.57–1.00)
Globulin, Total: 2.3 g/dL (ref 1.5–4.5)
Glucose: 106 mg/dL — ABNORMAL HIGH (ref 70–99)
Potassium: 4.6 mmol/L (ref 3.5–5.2)
Sodium: 139 mmol/L (ref 134–144)
Total Protein: 6.8 g/dL (ref 6.0–8.5)
eGFR: 82 mL/min/{1.73_m2} (ref >59)

## 2022-08-06 LAB — TSH: TSH: 1.79 u[IU]/mL (ref 0.450–4.500)

## 2022-08-06 LAB — VITAMIN D 25 HYDROXY (VIT D DEFICIENCY, FRACTURES): Vit D, 25-Hydroxy: 27.8 ng/mL — ABNORMAL LOW (ref 30.0–100.0)

## 2022-08-24 ENCOUNTER — Other Ambulatory Visit: Payer: Self-pay | Admitting: Family

## 2022-08-26 ENCOUNTER — Other Ambulatory Visit: Payer: Self-pay | Admitting: Family

## 2022-08-26 DIAGNOSIS — F411 Generalized anxiety disorder: Secondary | ICD-10-CM

## 2022-08-26 DIAGNOSIS — E785 Hyperlipidemia, unspecified: Secondary | ICD-10-CM

## 2022-08-26 DIAGNOSIS — M797 Fibromyalgia: Secondary | ICD-10-CM

## 2022-11-04 ENCOUNTER — Ambulatory Visit (INDEPENDENT_AMBULATORY_CARE_PROVIDER_SITE_OTHER): Payer: Medicare HMO | Admitting: Family

## 2022-11-04 ENCOUNTER — Encounter: Payer: Self-pay | Admitting: Family

## 2022-11-04 ENCOUNTER — Ambulatory Visit (HOSPITAL_COMMUNITY)
Admission: RE | Admit: 2022-11-04 | Discharge: 2022-11-04 | Disposition: A | Discharge status: Home / Self Care | Payer: Medicare HMO | Source: Ambulatory Visit | Attending: Family | Admitting: Family

## 2022-11-04 VITALS — BP 126/75 | HR 90 | Temp 97.8°F | Ht 64.0 in | Wt 222.0 lb

## 2022-11-04 DIAGNOSIS — K219 Gastro-esophageal reflux disease without esophagitis: Secondary | ICD-10-CM | POA: Diagnosis not present

## 2022-11-04 DIAGNOSIS — E1142 Type 2 diabetes mellitus with diabetic polyneuropathy: Secondary | ICD-10-CM | POA: Diagnosis not present

## 2022-11-04 DIAGNOSIS — E559 Vitamin D deficiency, unspecified: Secondary | ICD-10-CM

## 2022-11-04 DIAGNOSIS — R0602 Shortness of breath: Secondary | ICD-10-CM

## 2022-11-04 DIAGNOSIS — R079 Chest pain, unspecified: Secondary | ICD-10-CM

## 2022-11-04 DIAGNOSIS — K5900 Constipation, unspecified: Secondary | ICD-10-CM | POA: Diagnosis not present

## 2022-11-04 DIAGNOSIS — M159 Polyosteoarthritis, unspecified: Secondary | ICD-10-CM

## 2022-11-04 DIAGNOSIS — F1320 Sedative, hypnotic or anxiolytic dependence, uncomplicated: Secondary | ICD-10-CM

## 2022-11-04 DIAGNOSIS — Z6838 Body mass index (BMI) 38.0-38.9, adult: Secondary | ICD-10-CM | POA: Diagnosis not present

## 2022-11-04 DIAGNOSIS — M150 Primary generalized (osteo)arthritis: Secondary | ICD-10-CM

## 2022-11-04 DIAGNOSIS — Z79899 Other long term (current) drug therapy: Secondary | ICD-10-CM | POA: Diagnosis not present

## 2022-11-04 DIAGNOSIS — J9811 Atelectasis: Secondary | ICD-10-CM | POA: Diagnosis not present

## 2022-11-04 DIAGNOSIS — M797 Fibromyalgia: Secondary | ICD-10-CM | POA: Diagnosis not present

## 2022-11-04 DIAGNOSIS — R69 Illness, unspecified: Secondary | ICD-10-CM | POA: Diagnosis not present

## 2022-11-04 DIAGNOSIS — F411 Generalized anxiety disorder: Secondary | ICD-10-CM

## 2022-11-04 DIAGNOSIS — R0789 Other chest pain: Secondary | ICD-10-CM

## 2022-11-04 DIAGNOSIS — E785 Hyperlipidemia, unspecified: Secondary | ICD-10-CM

## 2022-11-04 MED ORDER — PREDNISONE 20 MG PO TABS
40.0000 mg | ORAL_TABLET | Freq: Every day | ORAL | 0 refills | Status: AC
Start: 2022-11-04 — End: 2022-11-09

## 2022-11-04 MED ORDER — DICLOFENAC SODIUM 75 MG PO TBEC
75.0000 mg | DELAYED_RELEASE_TABLET | Freq: Two times a day (BID) | ORAL | 0 refills | Status: DC
Start: 2022-11-04 — End: 2022-12-03

## 2022-11-04 MED ORDER — ALPRAZOLAM 0.5 MG PO TABS: 0.5000 mg | ORAL_TABLET | Freq: Every evening | ORAL | 2 refills | Status: DC | PRN

## 2022-11-04 NOTE — Patient Instructions (Signed)
Shortness of Breath, Adult Shortness of breath is when a person has trouble breathing or when a person feels like she or he is having trouble breathing in enough air. Shortness of breath could be a sign of a medical problem. Follow these instructions at home:  Pollutants Do not use any products that contain nicotine or tobacco. These products include cigarettes, chewing tobacco, and vaping devices, such as e-cigarettes. This also includes cigars and pipes. If you need help quitting, ask your health care provider. Avoid things that can irritate your airways, including: Smoke. This includes campfire smoke, forest fire smoke, and secondhand smoke from tobacco products. Do not smoke or allow others to smoke in your home. Mold. Dust. Air pollution. Chemical fumes. Things that can give you an allergic reaction (allergens) if you have allergies. Common allergens include pollen from grasses or trees and animal dander. Keep your living space clean and free of mold and dust. General instructions Pay attention to any changes in your symptoms. Take over-the-counter and prescription medicines only as told by your health care provider. This includes oxygen therapy and inhaled medicines. Rest as needed. Return to your normal activities as told by your health care provider. Ask your health care provider what activities are safe for you. Keep all follow-up visits. This is important. Contact a health care provider if: Your condition does not improve as soon as expected. You have a hard time doing your normal activities, even after you rest. You have new symptoms. You cannot walk up stairs or exercise the way that you normally do. Get help right away if: Your shortness of breath gets worse. You have shortness of breath when you are resting. You feel light-headed or you faint. You have a cough that is not controlled with medicines. You cough up blood. You have pain with breathing. You have pain in your  chest, arms, shoulders, or abdomen. You have a fever. These symptoms may be an emergency. Get help right away. Call 911. Do not wait to see if the symptoms will go away. Do not drive yourself to the hospital. Summary Shortness of breath is when a person has trouble breathing enough air. It can be a sign of a medical problem. Avoid things that irritate your lungs, such as smoking, pollution, mold, and dust. Pay attention to changes in your symptoms and contact your health care provider if you have a hard time completing daily activities because of shortness of breath. This information is not intended to replace advice given to you by your health care provider. Make sure you discuss any questions you have with your health care provider. Document Revised: 03/03/2021 Document Reviewed: 03/03/2021 Elsevier Patient Education  2023 [employer] Inc[employer]  

## 2022-11-04 NOTE — Progress Notes (Signed)
Subjective:    Patient ID: Cynthia Leon, female    DOB: November 22, 1950, 72 y.o.   MRN: [id number]  Chief Complaint  Patient presents with   Medical Management of Chronic Issues   Chest Pain   Shortness of Breath   Pt presents to the office today for chronic follow up. Pt is morbid obese today with a BMI of 38 and co-moribility of DM and GERD.   Chest Pain  This is a new problem. The current episode started 1 to 4 weeks ago. The problem occurs constantly. The problem has been unchanged. The pain is at a severity of 8/10. The quality of the pain is described as tightness. The pain radiates to the right arm and left arm. Associated symptoms include exertional chest pressure, malaise/fatigue and shortness of breath. She has tried rest for the symptoms. The treatment provided mild relief.  Her past medical history is significant for hyperlipidemia.  Shortness of Breath This is a new problem. The current episode started 1 to 4 weeks ago. Associated symptoms include chest pain. The treatment provided mild relief. There is no history of COPD.  Gastroesophageal Reflux She complains of chest pain, heartburn and a hoarse voice. This is a chronic problem. The current episode started more than 1 year ago. The problem occurs frequently. Risk factors include obesity. She has tried a PPI for the symptoms. The treatment provided moderate relief.  Diabetes She presents for her follow-up diabetic visit. She has type 2 diabetes mellitus. Hypoglycemia symptoms include nervousness/anxiousness. Associated symptoms include chest pain and foot paresthesias. Symptoms are stable. Diabetic complications include peripheral neuropathy. Risk factors for coronary artery disease include dyslipidemia, diabetes mellitus, hypertension, sedentary lifestyle and post-menopausal. She is following a generally healthy diet. (Does not check glucose at home)  Arthritis Presents for follow-up visit. She complains of pain and  stiffness. Affected locations include the left knee, right knee, left MCP and right MCP. Her pain is at a severity of 7/10.  Hyperlipidemia This is a chronic problem. The current episode started more than 1 year ago. The problem is controlled. Recent lipid tests were reviewed and are normal. Exacerbating diseases include obesity. Associated symptoms include chest pain and shortness of breath. Current antihyperlipidemic treatment includes statins. The current treatment provides moderate improvement of lipids. Risk factors for coronary artery disease include diabetes mellitus, dyslipidemia, hypertension, a sedentary lifestyle and post-menopausal.  Anxiety Presents for follow-up visit. Symptoms include chest pain, excessive worry, nervous/anxious behavior and shortness of breath. Symptoms occur occasionally. The severity of symptoms is moderate.    Constipation This is a chronic problem. The current episode started more than 1 year ago. The problem has been waxing and waning since onset. Her stool frequency is 2 to 3 times per week. She has tried laxatives for the symptoms. The treatment provided moderate relief.      Review of Systems  Constitutional:  Positive for malaise/fatigue.  HENT:  Positive for hoarse voice.   Respiratory:  Positive for shortness of breath.   Cardiovascular:  Positive for chest pain.  Gastrointestinal:  Positive for constipation and heartburn.  Musculoskeletal:  Positive for arthritis and stiffness.  Psychiatric/Behavioral:  The patient is nervous/anxious.   All other systems reviewed and are negative.      Objective:   Physical Exam Vitals reviewed.  Constitutional:      General: She is not in acute distress.    Appearance: She is well-developed. She is obese.  HENT:     Head: Normocephalic and  atraumatic.     Right Ear: External ear normal.  Eyes:     Pupils: Pupils are equal, round, and reactive to light.  Neck:     Thyroid: No thyromegaly.   Cardiovascular:     Rate and Rhythm: Normal rate and regular rhythm.     Heart sounds: Normal heart sounds. No murmur heard. Pulmonary:     Effort: Pulmonary effort is normal. No respiratory distress.     Breath sounds: Normal breath sounds. No wheezing.  Abdominal:     General: Bowel sounds are normal. There is no distension.     Palpations: Abdomen is soft.     Tenderness: There is no abdominal tenderness.  Musculoskeletal:        General: No tenderness. Normal range of motion.     Cervical back: Normal range of motion and neck supple.  Skin:    General: Skin is warm and dry.  Neurological:     Mental Status: She is alert and oriented to person, place, and time.     Cranial Nerves: No cranial nerve deficit.     Deep Tendon Reflexes: Reflexes are normal and symmetric.  Psychiatric:        Behavior: Behavior normal.        Thought Content: Thought content normal.        Judgment: Judgment normal.       BP 126/75   Pulse 90   Temp 97.8 F (36.6 C) (Temporal)   Ht 5\' 4"  (1.626 m)   Wt 222 lb (100.7 kg)   SpO2 99%   BMI 38.11 kg/m      Assessment & Plan:   Cynthia Leon comes in today with chief complaint of Medical Management of Chronic Issues, Chest Pain, and Shortness of Breath   Diagnosis and orders addressed:  1. Chest pain, unspecified type EKG stable, limit caffeine. Will do referral to Cardiologists for work up - EKG 12-Lead - CT CHEST WO CONTRAST; Future - CMP14+EGFR - CBC with Differential/Platelet - Ambulatory referral to Cardiology  2. GAD (generalized anxiety disorder) - ALPRAZolam (XANAX) 0.5 MG tablet; Take 1 tablet (0.5 mg total) by mouth at bedtime as needed for anxiety.  Dispense: 30 tablet; Refill: 2 - CMP14+EGFR - CBC with Differential/Platelet  3. Benzodiazepine dependence - ALPRAZolam (XANAX) 0.5 MG tablet; Take 1 tablet (0.5 mg total) by mouth at bedtime as needed for anxiety.  Dispense: 30 tablet; Refill: 2 - CMP14+EGFR - CBC  with Differential/Platelet  4. Controlled substance agreement signed - ALPRAZolam (XANAX) 0.5 MG tablet; Take 1 tablet (0.5 mg total) by mouth at bedtime as needed for anxiety.  Dispense: 30 tablet; Refill: 2 - CMP14+EGFR - CBC with Differential/Platelet  5. Fibromyalgia - CMP14+EGFR - CBC with Differential/Platelet  6. Hyperlipidemia, unspecified hyperlipidemia type - CMP14+EGFR - CBC with Differential/Platelet  7. Diabetic polyneuropathy associated with type 2 diabetes mellitus - CMP14+EGFR - CBC with Differential/Platelet  8. Primary osteoarthritis involving multiple joints - CMP14+EGFR - CBC with Differential/Platelet  9. Type 2 diabetes mellitus with diabetic polyneuropathy, without long-term current use of insulin - CMP14+EGFR - CBC with Differential/Platelet  10. Constipation, unspecified constipation type - CMP14+EGFR - CBC with Differential/Platelet  11. Morbid obesity - CMP14+EGFR - CBC with Differential/Platelet  12. Vitamin D deficiency - CMP14+EGFR - CBC with Differential/Platelet  13. Gastroesophageal reflux disease, unspecified whether esophagitis present - CMP14+EGFR - CBC with Differential/Platelet  14. SOB (shortness of breath) Given SOB will do CT - CT CHEST WO CONTRAST; Future -  CMP14+EGFR - CBC with Differential/Platelet - Ambulatory referral to Cardiology  15. Musculoskeletal chest pain Start prednisone and diclofenac BID with food. No other NSAID's  If chest pain worsens go to ED. I do not believe this is Cardiac but more MSK - predniSONE (DELTASONE) 20 MG tablet; Take 2 tablets (40 mg total) by mouth daily with breakfast for 5 days.  Dispense: 10 tablet; Refill: 0 - diclofenac (VOLTAREN) 75 MG EC tablet; Take 1 tablet (75 mg total) by mouth 2 (two) times daily.  Dispense: 30 tablet; Refill: 0   Labs pending Patient reviewed in [place] controlled database, no flags noted. Contract and drug screen are up to date.  Red flags discussed to go  to ED Approx 50 mins spent with patient discussing care, educating patient, reviewing chart, and reviewing CT scan.  Health Maintenance reviewed Diet and exercise encouraged  Follow up plan: 3 months    Jannifer Rodneyhristy Deni Berti, FNP

## 2022-11-05 LAB — CMP14+EGFR
ALT: 18 IU/L (ref 0–32)
AST: 15 IU/L (ref 0–40)
Albumin/Globulin Ratio: 1.7 (ref 1.2–2.2)
Albumin: 4.3 g/dL (ref 3.8–4.8)
Alkaline Phosphatase: 98 IU/L (ref 44–121)
BUN/Creatinine Ratio: 10 — ABNORMAL LOW (ref 12–28)
BUN: 8 mg/dL (ref 8–27)
Bilirubin Total: 0.3 mg/dL (ref 0.0–1.2)
CO2: 20 mmol/L (ref 20–29)
Calcium: 10 mg/dL (ref 8.7–10.3)
Chloride: 106 mmol/L (ref 96–106)
Creatinine, Ser: 0.79 mg/dL (ref 0.57–1.00)
Globulin, Total: 2.5 g/dL (ref 1.5–4.5)
Glucose: 114 mg/dL — ABNORMAL HIGH (ref 70–99)
Potassium: 4.9 mmol/L (ref 3.5–5.2)
Sodium: 143 mmol/L (ref 134–144)
Total Protein: 6.8 g/dL (ref 6.0–8.5)
eGFR: 79 mL/min/{1.73_m2} (ref >59)

## 2022-11-05 LAB — CBC WITH DIFFERENTIAL/PLATELET
Basophils Absolute: 0.1 10*3/uL (ref 0.0–0.2)
Basos: 1 %
EOS (ABSOLUTE): 0.3 10*3/uL (ref 0.0–0.4)
Eos: 4 %
Hematocrit: 42.8 % (ref 34.0–46.6)
Hemoglobin: 14.1 g/dL (ref 11.1–15.9)
Immature Grans (Abs): 0 10*3/uL (ref 0.0–0.1)
Immature Granulocytes: 0 %
Lymphocytes Absolute: 1.7 10*3/uL (ref 0.7–3.1)
Lymphs: 21 %
MCH: 28.7 pg (ref 26.6–33.0)
MCHC: 32.9 g/dL (ref 31.5–35.7)
MCV: 87 fL (ref 79–97)
Monocytes Absolute: 0.5 10*3/uL (ref 0.1–0.9)
Monocytes: 7 %
Neutrophils Absolute: 5.4 10*3/uL (ref 1.4–7.0)
Neutrophils: 67 %
Platelets: 277 10*3/uL (ref 150–450)
RBC: 4.92 x10E6/uL (ref 3.77–5.28)
RDW: 13.3 % (ref 11.7–15.4)
WBC: 7.9 10*3/uL (ref 3.4–10.8)

## 2022-11-07 ENCOUNTER — Other Ambulatory Visit: Payer: Self-pay | Admitting: Family

## 2022-11-07 DIAGNOSIS — R0602 Shortness of breath: Secondary | ICD-10-CM

## 2022-11-20 ENCOUNTER — Other Ambulatory Visit: Payer: Self-pay | Admitting: Family

## 2022-11-25 ENCOUNTER — Other Ambulatory Visit: Payer: Self-pay | Admitting: Family Medicine

## 2022-11-25 DIAGNOSIS — R059 Cough, unspecified: Secondary | ICD-10-CM

## 2022-12-02 ENCOUNTER — Other Ambulatory Visit: Payer: Self-pay | Admitting: Family

## 2022-12-02 DIAGNOSIS — M797 Fibromyalgia: Secondary | ICD-10-CM

## 2022-12-02 DIAGNOSIS — E785 Hyperlipidemia, unspecified: Secondary | ICD-10-CM

## 2022-12-02 DIAGNOSIS — R0789 Other chest pain: Secondary | ICD-10-CM

## 2022-12-02 DIAGNOSIS — F411 Generalized anxiety disorder: Secondary | ICD-10-CM

## 2022-12-03 ENCOUNTER — Ambulatory Visit: Payer: Medicare HMO | Attending: Internal Medicine | Admitting: Internal Medicine

## 2022-12-03 ENCOUNTER — Encounter: Payer: Self-pay | Admitting: Internal Medicine

## 2022-12-03 VITALS — BP 136/78 | HR 88 | Ht 64.0 in | Wt 222.6 lb

## 2022-12-03 DIAGNOSIS — R0789 Other chest pain: Secondary | ICD-10-CM | POA: Diagnosis not present

## 2022-12-03 DIAGNOSIS — R0609 Other forms of dyspnea: Secondary | ICD-10-CM | POA: Diagnosis not present

## 2022-12-03 MED ORDER — METOPROLOL TARTRATE 100 MG PO TABS: 100.0000 mg | ORAL_TABLET | Freq: Once | ORAL | 0 refills | Status: DC

## 2022-12-03 NOTE — Patient Instructions (Addendum)
Medication Instructions:  Your physician recommends that you continue on your current medications as directed. Please refer to the Current Medication list given to you today.  Labwork: none  Testing/Procedures: Your physician has requested that you have an echocardiogram. Echocardiography is a painless test that uses sound waves to create images of your heart. It provides your doctor with information about the size and shape of your heart and how well your heart's chambers and valves are working. This procedure takes approximately one hour. There are no restrictions for this procedure. Please do NOT wear cologne, perfume, aftershave, or lotions (deodorant is allowed). Please arrive 15 minutes prior to your appointment time. Coronary CTA-see instructions below  Follow-Up: Your physician recommends that you schedule a follow-up appointment in: 6 months  Any Other Special Instructions Will Be Listed Below (If Applicable).  If you need a refill on your cardiac medications before your next appointment, please call your pharmacy.    Your cardiac CT will be scheduled at one of the below locations:   [facility] [address] [place], [place] [address] [phone]  If scheduled at [facility], please arrive at the [facility] and Children's Entrance (Entrance C2) of [facility] 30 minutes prior to test start time. You can use the FREE valet parking offered at entrance C (encouraged to control the heart rate for the test)  Proceed to the [facility] Radiology Department (first floor) to check-in and test prep.  All radiology patients and guests should use entrance C2 at [facility], accessed from [facility], even though the hospital's physical address listed is [address]    Please follow these instructions carefully (unless otherwise directed):   On the Night Before the Test: Be sure to Drink plenty of water. Do not consume  any caffeinated/decaffeinated beverages or chocolate 12 hours prior to your test. Do not take any antihistamines (cetirizine) 12 hours prior to your test. If the patient has contrast allergy: No contrast allergy  On the Day of the Test: Drink plenty of water until 1 hour prior to the test. Do not eat any food 1 hour prior to test. You may take your regular medications prior to the test.  Take metoprolol (Lopressor) 100 mg two hours prior to test. FEMALES- please wear underwire-free bra if available, avoid dresses & tight clothing     After the Test: Drink plenty of water. After receiving IV contrast, you may experience a mild flushed feeling. This is normal. On occasion, you may experience a mild rash up to 24 hours after the test. This is not dangerous. If this occurs, you can take Benadryl 25 mg and increase your fluid intake. If you experience trouble breathing, this can be serious. If it is severe call [phone] IMMEDIATELY. If it is mild, please call our office.  We will call to schedule your test 2-4 weeks out understanding that some insurance companies will need an authorization prior to the service being performed.   For non-scheduling related questions, please contact the cardiac imaging nurse navigator should you have any questions/concerns: Rockwell Alexandria, Cardiac Imaging Nurse Navigator Larey Brick, Cardiac Imaging Nurse Navigator [facility] Direct Office Dial: [phone]   For scheduling needs, including cancellations and rescheduling, please call [place], [phone].

## 2022-12-03 NOTE — Progress Notes (Signed)
Cardiology Office Note  Date: 12/03/2022   ID: Cynthia Leon, DOB 1951-02-08, MRN [id number]  PCP:  Junie Spencer, FNP  Cardiologist:  Marjo Bicker, MD Electrophysiologist:  None   Reason for Office Visit: Evaluation chest pain at the request of Lendon Colonel, FNP   History of Present Illness: Cynthia Leon is a 72 y.o. female known to have HLD was referred to cardiology clinic for evaluation of DOE and chest pain.  Patient has been having chest tightness substernally radiating to the back, bilateral shoulders and has been persistent for the last 3 months. The tightness never resolved.  It gets worsened with deep breath, palpation and positional changes.  She also has DOE for the last 3 to 6 months associated with some leg swelling.  DOE has been interfering with her ADLs and household chores. She says she was active before the symptoms started, can perform all her household chores and even mow the lawn with no symptoms.  She underwent CT chest that did not show any etiology that could explain the chest pain.  No other symptoms of syncope, palpitations.  Denies smoking cigarettes, alcohol use and illicit drug abuse.  Past Medical History:  Diagnosis Date   Abdominal pain    Allergic rhinitis    Anxiety    Constipation    Fibromyalgia    GERD (gastroesophageal reflux disease)    Head ache    Hyperlipidemia    Osteopenia    PUD (peptic ulcer disease)    Right hip pain    Shingles     Past Surgical History:  Procedure Laterality Date   COLONOSCOPY N/A 12/13/2015   Procedure: COLONOSCOPY;  Surgeon: Malissa Hippo, MD;  Location: [facility];  Service: Endoscopy;  Laterality: N/A;  1030   TOTAL HIP ARTHROPLASTY Right     Current Outpatient Medications  Medication Sig Dispense Refill   ALPRAZolam (XANAX) 0.5 MG tablet Take 1 tablet (0.5 mg total) by mouth at bedtime as needed for anxiety. 30 tablet 2   Calcium & Magnesium Carbonates (MYLANTA PO) Take by  mouth as needed.     cetirizine (ZYRTEC) 10 MG tablet TAKE 1 TABLET BY MOUTH EVERY DAY 90 tablet 3   cyclobenzaprine (FLEXERIL) 10 MG tablet TAKE 1 TABLET BY MOUTH THREE TIMES A DAY AS NEEDED FOR MUSCLE SPASM 90 tablet 2   diclofenac (VOLTAREN) 75 MG EC tablet TAKE 1 TABLET BY MOUTH TWICE A DAY 30 tablet 0   DULoxetine (CYMBALTA) 60 MG capsule TAKE 1 CAPSULE BY MOUTH EVERY DAY 90 capsule 0   fluticasone (FLONASE) 50 MCG/ACT nasal spray USE 2 SPRAYS IN EACH NOSTRIL ONCE DAILY 16 g 4   [START ON 12/04/2022] metoprolol tartrate (LOPRESSOR) 100 MG tablet Take 1 tablet (100 mg total) by mouth once for 1 dose. 2 hours before CT 1 tablet 0   Omeprazole-Sodium Bicarbonate (ZEGERID OTC) 20-1100 MG CAPS capsule Take 1 capsule by mouth daily before breakfast. 28 each 0   rosuvastatin (CRESTOR) 20 MG tablet TAKE 1 TABLET BY MOUTH EVERY DAY 90 tablet 0   topiramate (TOPAMAX) 50 MG tablet TAKE 1 TABLET BY MOUTH TWICE A DAY 180 tablet 0   gabapentin (NEURONTIN) 600 MG tablet Take 1 tablet (600 mg total) by mouth 3 (three) times daily. (Patient not taking: Reported on 12/03/2022) 90 tablet 1   No current facility-administered medications for this visit.   Allergies:  Mobic [meloxicam], Asa [aspirin], and Gabapentin   Social History: The patient  reports that she has never smoked. She has been exposed to tobacco smoke. She has never used smokeless tobacco. She reports that she does not drink alcohol and does not use drugs.   Family History: The patient's family history includes Breast cancer in her mother; COPD in her father; Cancer in her brother, brother, brother, brother, mother, and sister; Coronary artery disease in her mother and sister; Diabetes in her sister and sister; Emphysema in her father; Esophageal cancer in her sister; Healthy in her daughter, daughter, daughter, and daughter; Heart disease in her father; Hypertension in her father; Lupus in her brother.   ROS:  Please see the history of present  illness. Otherwise, complete review of systems is positive for none  All other systems are reviewed and negative.   Physical Exam: VS:  BP 136/78   Pulse 88   Ht 5\' 4"  (1.626 m)   Wt 222 lb 9.6 oz (101 kg)   SpO2 95%   BMI 38.21 kg/m , BMI Body mass index is 38.21 kg/m.  Wt Readings from Last 3 Encounters:  12/03/22 222 lb 9.6 oz (101 kg)  11/04/22 222 lb (100.7 kg)  08/05/22 223 lb (101.2 kg)    General: Patient appears comfortable at rest. HEENT: Conjunctiva and lids normal, oropharynx clear with moist mucosa. Neck: Supple, no elevated JVP or carotid bruits, no thyromegaly. Lungs: Clear to auscultation, nonlabored breathing at rest. Cardiac: Regular rate and rhythm, no S3 or significant systolic murmur, no pericardial rub. Abdomen: Soft, nontender, no hepatomegaly, bowel sounds present, no guarding or rebound. Extremities: No pitting edema, distal pulses 2+. Skin: Warm and dry. Musculoskeletal: No kyphosis. Neuropsychiatric: Alert and oriented x3, affect grossly appropriate.  Recent Labwork: 08/05/2022: TSH 1.790 11/04/2022: ALT 18; AST 15; BUN 8; Creatinine, Ser 0.79; Hemoglobin 14.1; Platelets 277; Potassium 4.9; Sodium 143     Component Value Date/Time   CHOL 167 08/05/2022 1252   TRIG 178 (H) 08/05/2022 1252   HDL 51 08/05/2022 1252   CHOLHDL 3.3 08/05/2022 1252   CHOLHDL 3.5 02/17/2013 1831   VLDL 32 02/17/2013 1831   LDLCALC 86 08/05/2022 1252     Assessment and Plan: Patient is a 72 year old F known to have HLD was referred to cardiology clinic for evaluation of chest pain and DOE.  # Noncardiac chest pain: Chest tightness has been persistent for the last 3 months, worsens with deep breath, palpation and positional changes. The chest pain never resolved in the last 3 months. No further cardiac testing is indicated at this time.  # DOE: DOE has been ongoing for the last 3 to 6 months interfering with ADLs and household chores. Patient has been active before the  symptoms started, was able to perform household chores and mow the lawn with no symptoms. Will obtain CT cardiac to definitely rule out significant CAD.  Obtain 2D echocardiogram.   I have spent a total of 45 minutes with patient reviewing chart, EKGs, labs and examining patient as well as establishing an assessment and plan that was discussed with the patient.  > 50% of time was spent in direct patient care.    Medication Adjustments/Labs and Tests Ordered: Current medicines are reviewed at length with the patient today.  Concerns regarding medicines are outlined above.   Tests Ordered: Orders Placed This Encounter  Procedures   CT CORONARY MORPH W/CTA COR W/SCORE W/CA W/CM &/OR WO/CM   EKG 12-Lead   ECHOCARDIOGRAM COMPLETE    Medication Changes: Meds ordered this encounter  Medications  metoprolol tartrate (LOPRESSOR) 100 MG tablet    Sig: Take 1 tablet (100 mg total) by mouth once for 1 dose. 2 hours before CT    Dispense:  1 tablet    Refill:  0    Disposition:  Follow up  55-month follow-up  Signed Fillmore Bynum Verne Spurr, MD, 12/03/2022 5:04 PM    [facility] at [facility] [address] [place], [place], [place] [address]

## 2022-12-04 ENCOUNTER — Ambulatory Visit (INDEPENDENT_AMBULATORY_CARE_PROVIDER_SITE_OTHER): Payer: Medicare HMO

## 2022-12-04 ENCOUNTER — Encounter: Payer: Self-pay | Admitting: Family Medicine

## 2022-12-04 ENCOUNTER — Ambulatory Visit (INDEPENDENT_AMBULATORY_CARE_PROVIDER_SITE_OTHER): Payer: Medicare HMO | Admitting: Family Medicine

## 2022-12-04 VITALS — BP 112/66 | HR 81 | Temp 98.5°F | Ht 64.0 in | Wt 221.0 lb

## 2022-12-04 DIAGNOSIS — R0609 Other forms of dyspnea: Secondary | ICD-10-CM | POA: Diagnosis not present

## 2022-12-04 DIAGNOSIS — R0789 Other chest pain: Secondary | ICD-10-CM | POA: Diagnosis not present

## 2022-12-04 DIAGNOSIS — M25512 Pain in left shoulder: Secondary | ICD-10-CM

## 2022-12-04 DIAGNOSIS — G8929 Other chronic pain: Secondary | ICD-10-CM

## 2022-12-04 DIAGNOSIS — M19012 Primary osteoarthritis, left shoulder: Secondary | ICD-10-CM | POA: Diagnosis not present

## 2022-12-04 DIAGNOSIS — M25511 Pain in right shoulder: Secondary | ICD-10-CM

## 2022-12-04 DIAGNOSIS — M19011 Primary osteoarthritis, right shoulder: Secondary | ICD-10-CM | POA: Diagnosis not present

## 2022-12-04 NOTE — Progress Notes (Signed)
Acute Office Visit  Subjective:  Patient ID: Cynthia Leon, female    DOB: [birth year], 72 y.o.   MRN: [id number]  Chief Complaint  Patient presents with   Pain   HPI Patient is in today for states that she is here for Xrays. She was seen previously by another doctor in [place]. Reports bilateral shoulder soreness. Rate pain as 8/10. States that it started a year ago. Ice makes it worse. Takes ibuprofen for pain, helps some. Has not tried heat on her left shoulder, but has tried it on her right and it seems to help some.  States that about one year ago reached down to grab something with her left arm and felt a pull. States that since then, the pain has just gotten worse.  States that she also has shortness of breath due to the pain.   ROS As per HPI Objective:  BP 112/66   Pulse 81   Temp 98.5 F (36.9 C)   Ht 5\' 4"  (1.626 m)   Wt 221 lb (100.2 kg)   SpO2 96%   BMI 37.93 kg/m   Physical Exam Constitutional:      General: She is not in acute distress.    Appearance: Normal appearance. She is not ill-appearing, toxic-appearing or diaphoretic.  Cardiovascular:     Rate and Rhythm: Normal rate.     Pulses: Normal pulses.     Heart sounds: Normal heart sounds. No murmur heard.    No gallop.  Pulmonary:     Effort: Pulmonary effort is normal. No respiratory distress.     Breath sounds: Normal breath sounds. No stridor. No wheezing, rhonchi or rales.  Musculoskeletal:     Right shoulder: Tenderness, bony tenderness and crepitus present. No swelling, deformity, effusion or laceration. Decreased range of motion.     Left shoulder: Tenderness, bony tenderness and crepitus present. No swelling, deformity, effusion or laceration. Decreased range of motion.  Skin:    General: Skin is warm.     Capillary Refill: Capillary refill takes less than 2 seconds.  Neurological:     General: No focal deficit present.     Mental Status: She is alert and oriented to person, place, and  time. Mental status is at baseline.     Motor: No weakness.  Psychiatric:        Mood and Affect: Mood normal.        Behavior: Behavior normal.        Thought Content: Thought content normal.        Judgment: Judgment normal.       12/04/2022    1:04 PM 08/05/2022   12:26 PM 03/22/2022    4:04 PM  Depression screen PHQ 2/9  Decreased Interest 0 0 0  Down, Depressed, Hopeless 0 0 0  PHQ - 2 Score 0 0 0  Altered sleeping 0 0 0  Tired, decreased energy 0 0 0  Change in appetite 0 0 0  Feeling bad or failure about yourself  0 0 0  Trouble concentrating 0 0 0  Moving slowly or fidgety/restless 0 0 0  Suicidal thoughts 0 0 0  PHQ-9 Score 0 0 0  Difficult doing work/chores Not difficult at all Not difficult at all       12/04/2022    1:04 PM 08/05/2022   12:27 PM 03/22/2022    4:05 PM 11/20/2021    2:02 PM  GAD 7 : Generalized Anxiety Score  Nervous, Anxious, on Edge 0  0 0 0  Control/stop worrying 0 0 0 0  Worry too much - different things 0 0 0 0  Trouble relaxing 0 0 0 0  Restless 0 0 0 0  Easily annoyed or irritable 0 0 0 0  Afraid - awful might happen 0 0 0 0  Total GAD 7 Score 0 0 0 0  Anxiety Difficulty Not difficult at all Not difficult at all  Not difficult at all   Assessment & Plan:  1. Chronic pain of both shoulders Per patient she was instructed from her cardiologist to follow up with PCP for imaging of her shoulders. Patient recently received prednisone burst 11/04/2022. She continues to take NSAIDs. Will review results and determine next steps.  - DG Shoulder Left; Future - DG Shoulder Right; Future  2. Non-cardiac chest pain 3. DOE (dyspnea on exertion) Patient was seen yesterday by Dr. Jenene Slicker yesterday 12/03/22. Per note, did not believe that additional cardiac testing is indicated. A CT Coronary and Echocardiogram were ordered to evaluate ongoing DOE. She has both CT and Echo scheduled. She also has follow up 12/16/22 with Dr. Sherene Sires (pulm).   The above assessment  and management plan was discussed with the patient. The patient verbalized understanding of and has agreed to the management plan using shared-decision making. Patient is aware to call the clinic if they develop any new symptoms or if symptoms fail to improve or worsen. Patient is aware when to return to the clinic for a follow-up visit. Patient educated on when it is appropriate to go to the emergency department.   Return if symptoms worsen or fail to improve.  Neale Burly, DNP-FNP [facility] [address] [place], [place] [address] [phone]

## 2022-12-05 ENCOUNTER — Ambulatory Visit (INDEPENDENT_AMBULATORY_CARE_PROVIDER_SITE_OTHER): Payer: Medicare HMO

## 2022-12-05 VITALS — Ht 64.0 in | Wt 221.0 lb

## 2022-12-05 DIAGNOSIS — Z780 Asymptomatic menopausal state: Secondary | ICD-10-CM

## 2022-12-05 DIAGNOSIS — Z0000 Encounter for general adult medical examination without abnormal findings: Secondary | ICD-10-CM | POA: Diagnosis not present

## 2022-12-05 NOTE — Progress Notes (Signed)
Subjective:   Cynthia Leon is a 72 y.o. female who presents for Medicare Annual (Subsequent) preventive examination. I connected with  [name] on 12/05/22 by a audio enabled telemedicine application and verified that I am speaking with the correct person using two identifiers.  Patient Location: Home  Provider Location: Home Office  I discussed the limitations of evaluation and management by telemedicine. The patient expressed understanding and agreed to proceed.  Review of Systems     Cardiac Risk Factors include: advanced age (>29men, >36 women);hypertension;dyslipidemia     Objective:    Today's Vitals   12/05/22 1156  Weight: 221 lb (100.2 kg)  Height: 5\' 4"  (1.626 m)   Body mass index is 37.93 kg/m.     12/05/2022   11:59 AM 12/03/2021   12:12 PM 12/01/2020    9:37 AM 12/01/2019    9:00 AM 11/30/2018    9:08 AM 11/26/2017   10:45 AM 11/25/2016   10:41 AM  Advanced Directives  Does Patient Have a Medical Advance Directive? Yes No Yes Yes No No No  Type of Estate agent of [place];Living will  Healthcare Power of [place];Living will Healthcare Power of Attorney     Does patient want to make changes to medical advance directive?    No - Patient declined     Copy of Healthcare Power of Attorney in Chart? No - copy requested  No - copy requested      Would patient like information on creating a medical advance directive?  No - Patient declined   Yes (MAU/Ambulatory/Procedural Areas - Information given) No - Patient declined Yes (MAU/Ambulatory/Procedural Areas - Information given)    Current Medications (verified) Outpatient Encounter Medications as of 12/05/2022  Medication Sig   ALPRAZolam (XANAX) 0.5 MG tablet Take 1 tablet (0.5 mg total) by mouth at bedtime as needed for anxiety.   Calcium & Magnesium Carbonates (MYLANTA PO) Take by mouth as needed.   cetirizine (ZYRTEC) 10 MG tablet TAKE 1 TABLET BY MOUTH EVERY DAY   cyclobenzaprine  (FLEXERIL) 10 MG tablet TAKE 1 TABLET BY MOUTH THREE TIMES A DAY AS NEEDED FOR MUSCLE SPASM   diclofenac (VOLTAREN) 75 MG EC tablet TAKE 1 TABLET BY MOUTH TWICE A DAY   DULoxetine (CYMBALTA) 60 MG capsule TAKE 1 CAPSULE BY MOUTH EVERY DAY   fluticasone (FLONASE) 50 MCG/ACT nasal spray USE 2 SPRAYS IN EACH NOSTRIL ONCE DAILY   gabapentin (NEURONTIN) 600 MG tablet Take 1 tablet (600 mg total) by mouth 3 (three) times daily.   Omeprazole-Sodium Bicarbonate (ZEGERID OTC) 20-1100 MG CAPS capsule Take 1 capsule by mouth daily before breakfast.   rosuvastatin (CRESTOR) 20 MG tablet TAKE 1 TABLET BY MOUTH EVERY DAY   topiramate (TOPAMAX) 50 MG tablet TAKE 1 TABLET BY MOUTH TWICE A DAY   metoprolol tartrate (LOPRESSOR) 100 MG tablet Take 1 tablet (100 mg total) by mouth once for 1 dose. 2 hours before CT   No facility-administered encounter medications on file as of 12/05/2022.    Allergies (verified) Mobic [meloxicam], Asa [aspirin], and Gabapentin   History: Past Medical History:  Diagnosis Date   Abdominal pain    Allergic rhinitis    Anxiety    Constipation    Fibromyalgia    GERD (gastroesophageal reflux disease)    Head ache    Hyperlipidemia    Osteopenia    PUD (peptic ulcer disease)    Right hip pain    Shingles  Past Surgical History:  Procedure Laterality Date   COLONOSCOPY N/A 12/13/2015   Procedure: COLONOSCOPY;  Surgeon: Malissa Hippo, MD;  Location: [facility];  Service: Endoscopy;  Laterality: N/A;  1030   TOTAL HIP ARTHROPLASTY Right    Family History  Problem Relation Age of Onset   Breast cancer Mother    Coronary artery disease Mother    Cancer Mother        breast   Emphysema Father    COPD Father    Hypertension Father    Heart disease Father    Diabetes Sister    Cancer Sister        throat   Coronary artery disease Sister    Diabetes Sister    Esophageal cancer Sister    Cancer Brother        brain   Cancer Brother        bone   Cancer  Brother        lung   Cancer Brother    Lupus Brother    Healthy Daughter    Healthy Daughter    Healthy Daughter    Healthy Daughter    Social History   Socioeconomic History   Marital status: Married    Spouse name: Remi Deter   Number of children: 3   Years of education: 11   Highest education level: 11th grade  Occupational History   Occupation: disabled   Tobacco Use   Smoking status: Never    Passive exposure: Yes   Smokeless tobacco: Never  Vaping Use   Vaping Use: Never used  Substance and Sexual Activity   Alcohol use: No   Drug use: No   Sexual activity: Yes  Other Topics Concern   Not on file  Social History Narrative   Lives with her husband, their 3 daughters all live nearby   Social Determinants of Health   Financial Resource Strain: Low Risk  (12/05/2022)   Overall Financial Resource Strain (CARDIA)    Difficulty of Paying Living Expenses: Not hard at all  Food Insecurity: No Food Insecurity (12/05/2022)   Hunger Vital Sign    Worried About Running Out of Food in the Last Year: Never true    Ran Out of Food in the Last Year: Never true  Transportation Needs: No Transportation Needs (12/03/2021)   PRAPARE - Administrator, Civil Service (Medical): No    Lack of Transportation (Non-Medical): No  Physical Activity: Inactive (12/05/2022)   Exercise Vital Sign    Days of Exercise per Week: 0 days    Minutes of Exercise per Session: 0 min  Stress: No Stress Concern Present (12/05/2022)   [employer] of Occupational Health - Occupational Stress Questionnaire    Feeling of Stress : Not at all  Social Connections: Moderately Integrated (12/05/2022)   Social Connection and Isolation Panel [NHANES]    Frequency of Communication with Friends and Family: More than three times a week    Frequency of Social Gatherings with Friends and Family: More than three times a week    Attends Religious Services: 1 to 4 times per year    Active Member of [employer] or  Organizations: No    Attends Banker Meetings: Never    Marital Status: Married    Tobacco Counseling Counseling given: Not Answered   Clinical Intake:  Pre-visit preparation completed: Yes  Pain : No/denies pain     Nutritional Risks: None Diabetes: No  How often do  you need to have someone help you when you read instructions, pamphlets, or other written materials from your doctor or pharmacy?: 1 - Never  Diabetic?no   Interpreter Needed?: No  Information entered by :: Renie Ora, LPN   Activities of Daily Living    12/05/2022   12:00 PM  In your present state of health, do you have any difficulty performing the following activities:  Hearing? 0  Vision? 0  Difficulty concentrating or making decisions? 0  Walking or climbing stairs? 0  Dressing or bathing? 0  Doing errands, shopping? 0  Preparing Food and eating ? N  Using the Toilet? N  In the past six months, have you accidently leaked urine? N  Do you have problems with loss of bowel control? N  Managing your Medications? N  Managing your Finances? N  Housekeeping or managing your Housekeeping? N    Patient Care Team: Junie Spencer, FNP as PCP - General (Family Medicine) Marjo Bicker, MD as PCP - Cardiology (Cardiology) Moody Bruins as Physician Assistant (Chiropractic Medicine) Delora Fuel, OD (Optometry)  Indicate any recent Medical Services you may have received from other than [facility] providers in the past year (date may be approximate).     Assessment:   This is a routine wellness examination for Azuree.  Hearing/Vision screen Vision Screening - Comments:: Wears rx glasses - up to date with routine eye exams with  Dr.Johnson   Dietary issues and exercise activities discussed: Current Exercise Habits: The patient does not participate in regular exercise at present, Exercise limited by: orthopedic condition(s)   Goals Addressed             This Visit's  Progress    Obtain Annual Eye (retinal)  Exam    On track      Depression Screen    12/05/2022   11:58 AM 12/04/2022    1:04 PM 08/05/2022   12:26 PM 03/22/2022    4:04 PM 01/24/2022    9:14 AM 12/03/2021   12:10 PM 11/20/2021    2:01 PM  PHQ 2/9 Scores  PHQ - 2 Score 0 0 0 0 0 0 0  PHQ- 9 Score 0 0 0 0  0 0    Fall Risk    12/05/2022   11:57 AM 12/04/2022    1:05 PM 08/05/2022   12:27 PM 03/22/2022    4:00 PM 01/24/2022    9:16 AM  Fall Risk   Falls in the past year? 0 0 0 1 1  Number falls in past yr: 0 0 0 0 0  Injury with Fall? 0 0 0 0 0  Risk for fall due to : No Fall Risks No Fall Risks No Fall Risks History of fall(s) No Fall Risks  Follow up Falls prevention discussed Falls evaluation completed Falls evaluation completed  Falls evaluation completed    FALL RISK PREVENTION PERTAINING TO THE HOME:  Any stairs in or around the home? No  If so, are there any without handrails? No  Home free of loose throw rugs in walkways, pet beds, electrical cords, etc? Yes  Adequate lighting in your home to reduce risk of falls? Yes   ASSISTIVE DEVICES UTILIZED TO PREVENT FALLS:  Life alert? No  Use of a cane, walker or w/c? No  Grab bars in the bathroom? No  Shower chair or bench in shower? No  Elevated toilet seat or a handicapped toilet? No       11/26/2017  9:17 AM 11/25/2016   10:47 AM  MMSE - Mini Mental State Exam  Orientation to time 5 5  Orientation to Place 5 5  Registration 3 2  Registration-comments  HAD TO REPEAT FOR PATIENT  Attention/ Calculation 5 5  Recall 2 3  Language- name 2 objects 2 2  Language- repeat 1 1  Language- follow 3 step command 3 3  Language- read & follow direction 1 1  Write a sentence 1 1  Copy design 1 1  Total score 29 29        12/05/2022   12:00 PM 12/03/2021   12:13 PM 12/01/2019    9:06 AM 11/30/2018    9:09 AM  6CIT Screen  What Year? 0 points 0 points 0 points 0 points  What month? 0 points 3 points 0 points 0 points  What time? 0  points 0 points 0 points 0 points  Count back from 20 0 points 0 points 0 points 0 points  Months in reverse 0 points 4 points 0 points 0 points  Repeat phrase 0 points 6 points 4 points 4 points  Total Score 0 points 13 points 4 points 4 points    Immunizations Immunization History  Administered Date(s) Administered   Pneumococcal Conjugate-13 11/09/2015   Pneumococcal Polysaccharide-23 11/25/2016   Td 11/02/2008, 11/25/2018    TDAP status: Up to date  Flu Vaccine status: Declined, Education has been provided regarding the importance of this vaccine but patient still declined. Advised may receive this vaccine at local pharmacy or Health Dept. Aware to provide a copy of the vaccination record if obtained from local pharmacy or Health Dept. Verbalized acceptance and understanding.  Pneumococcal vaccine status: Up to date  Covid-19 vaccine status: Declined, Education has been provided regarding the importance of this vaccine but patient still declined. Advised may receive this vaccine at local pharmacy or Health Dept.or vaccine clinic. Aware to provide a copy of the vaccination record if obtained from local pharmacy or Health Dept. Verbalized acceptance and understanding.  Qualifies for Shingles Vaccine? Yes   Zostavax completed No   Shingrix Completed?: No.    Education has been provided regarding the importance of this vaccine. Patient has been advised to call insurance company to determine out of pocket expense if they have not yet received this vaccine. Advised may also receive vaccine at local pharmacy or Health Dept. Verbalized acceptance and understanding.  Screening Tests Health Maintenance  Topic Date Due   Zoster Vaccines- Shingrix (1 of 2) Never done   COVID-19 Vaccine (1) 12/20/2022 (Originally 07/19/1956)   DEXA SCAN  01/06/2023   MAMMOGRAM  01/22/2023   FOOT EXAM  01/25/2023   HEMOGLOBIN A1C  02/03/2023   INFLUENZA VACCINE  02/27/2023   Diabetic kidney evaluation -  Urine ACR  08/06/2023   Diabetic kidney evaluation - eGFR measurement  11/04/2023   OPHTHALMOLOGY EXAM  11/06/2023   Medicare Annual Wellness (AWV)  12/05/2023   Fecal DNA (Cologuard)  05/02/2024   DTaP/Tdap/Td (3 - Tdap) 11/24/2028   Pneumonia Vaccine 37+ Years old  Completed   Hepatitis C Screening  Completed   HPV VACCINES  Aged Out   COLONOSCOPY (Pts 45-44yrs Insurance coverage will need to be confirmed)  Discontinued    Health Maintenance  Health Maintenance Due  Topic Date Due   Zoster Vaccines- Shingrix (1 of 2) Never done    Colorectal cancer screening: Type of screening: Cologuard. Completed 05/02/2024. Repeat every 3 years  Mammogram status: Completed 01/21/2022.  Repeat every year  Bone Density status: Completed 01/05/2021. Results reflect: Bone density results: OSTEOPOROSIS. Repeat every 2 years.  Lung Cancer Screening: (Low Dose CT Chest recommended if Age 37-80 years, 30 pack-year currently smoking OR have quit w/in 15years.) does not qualify.   Lung Cancer Screening Referral: n/a  Additional Screening:  Hepatitis C Screening: does not qualify; Completed 09/08/2015  Vision Screening: Recommended annual ophthalmology exams for early detection of glaucoma and other disorders of the eye. Is the patient up to date with their annual eye exam?  Yes  Who is the provider or what is the name of the office in which the patient attends annual eye exams? Dr.Johnson  If pt is not established with a provider, would they like to be referred to a provider to establish care? No .   Dental Screening: Recommended annual dental exams for proper oral hygiene  Community Resource Referral / Chronic Care Management: CRR required this visit?  No   CCM required this visit?  No      Plan:     I have personally reviewed and noted the following in the patient's chart:   Medical and social history Use of alcohol, tobacco or illicit drugs  Current medications and supplements  including opioid prescriptions. Patient is not currently taking opioid prescriptions. Functional ability and status Nutritional status Physical activity Advanced directives List of other physicians Hospitalizations, surgeries, and ER visits in previous 12 months Vitals Screenings to include cognitive, depression, and falls Referrals and appointments  In addition, I have reviewed and discussed with patient certain preventive protocols, quality metrics, and best practice recommendations. A written personalized care plan for preventive services as well as general preventive health recommendations were provided to patient.     Lorrene Reid, LPN   08/03/1094   Nurse Notes: none

## 2022-12-05 NOTE — Patient Instructions (Signed)
Cynthia Leon , Thank you for taking time to come for your Medicare Wellness Visit. I appreciate your ongoing commitment to your health goals. Please review the following plan we discussed and let me know if I can assist you in the future.   These are the goals we discussed:  Goals      Exercise 150 min/wk Moderate Activity     Walking is a great option.     Obtain Annual Eye (retinal)  Exam      Weight (lb) < 210 lb (95.3 kg)        This is a list of the screening recommended for you and due dates:  Health Maintenance  Topic Date Due   Zoster (Shingles) Vaccine (1 of 2) Never done   COVID-19 Vaccine (1) 12/20/2022*   DEXA scan (bone density measurement)  01/06/2023   Mammogram  01/22/2023   Complete foot exam   01/25/2023   Hemoglobin A1C  02/03/2023   Flu Shot  02/27/2023   Yearly kidney health urinalysis for diabetes  08/06/2023   Yearly kidney function blood test for diabetes  11/04/2023   Eye exam for diabetics  11/06/2023   Medicare Annual Wellness Visit  12/05/2023   Cologuard (Stool DNA test)  05/02/2024   DTaP/Tdap/Td vaccine (3 - Tdap) 11/24/2028   Pneumonia Vaccine  Completed   Hepatitis C Screening: USPSTF Recommendation to screen - Ages 73-73 yo.  Completed   HPV Vaccine  Aged Out   Colon Cancer Screening  Discontinued  *Topic was postponed. The date shown is not the original due date.    Advanced directives: Advance directive discussed with you today. I have provided a copy for you to complete at home and have notarized. Once this is complete please bring a copy in to our office so we can scan it into your chart.   Conditions/risks identified: Aim for 30 minutes of exercise or brisk walking, 6-8 glasses of water, and 5 servings of fruits and vegetables each day.   Next appointment: Follow up in one year for your annual wellness visit    Preventive Care 65 Years and Older, Female Preventive care refers to lifestyle choices and visits with your health care  provider that can promote health and wellness. What does preventive care include? A yearly physical exam. This is also called an annual well check. Dental exams once or twice a year. Routine eye exams. Ask your health care provider how often you should have your eyes checked. Personal lifestyle choices, including: Daily care of your teeth and gums. Regular physical activity. Eating a healthy diet. Avoiding tobacco and drug use. Limiting alcohol use. Practicing safe sex. Taking low-dose aspirin every day. Taking vitamin and mineral supplements as recommended by your health care provider. What happens during an annual well check? The services and screenings done by your health care provider during your annual well check will depend on your age, overall health, lifestyle risk factors, and family history of disease. Counseling  Your health care provider may ask you questions about your: Alcohol use. Tobacco use. Drug use. Emotional well-being. Home and relationship well-being. Sexual activity. Eating habits. History of falls. Memory and ability to understand (cognition). Work and work Astronomer. Reproductive health. Screening  You may have the following tests or measurements: Height, weight, and BMI. Blood pressure. Lipid and cholesterol levels. These may be checked every 5 years, or more frequently if you are over 44 years old. Skin check. Lung cancer screening. You may have this screening  every year starting at age 73 if you have a 30-pack-year history of smoking and currently smoke or have quit within the past 15 years. Fecal occult blood test (FOBT) of the stool. You may have this test every year starting at age 73. Flexible sigmoidoscopy or colonoscopy. You may have a sigmoidoscopy every 5 years or a colonoscopy every 10 years starting at age 65. Hepatitis C blood test. Hepatitis B blood test. Sexually transmitted disease (STD) testing. Diabetes screening. This is done by  checking your blood sugar (glucose) after you have not eaten for a while (fasting). You may have this done every 1-3 years. Bone density scan. This is done to screen for osteoporosis. You may have this done starting at age 73. Mammogram. This may be done every 1-2 years. Talk to your health care provider about how often you should have regular mammograms. Talk with your health care provider about your test results, treatment options, and if necessary, the need for more tests. Vaccines  Your health care provider may recommend certain vaccines, such as: Influenza vaccine. This is recommended every year. Tetanus, diphtheria, and acellular pertussis (Tdap, Td) vaccine. You may need a Td booster every 10 years. Zoster vaccine. You may need this after age 39. Pneumococcal 13-valent conjugate (PCV13) vaccine. One dose is recommended after age 73. Pneumococcal polysaccharide (PPSV23) vaccine. One dose is recommended after age 73. Talk to your health care provider about which screenings and vaccines you need and how often you need them. This information is not intended to replace advice given to you by your health care provider. Make sure you discuss any questions you have with your health care provider. Document Released: 08/11/2015 Document Revised: 04/03/2016 Document Reviewed: 05/16/2015 Elsevier Interactive Patient Education  2017 [employer].  Fall Prevention in the Home Falls can cause injuries. They can happen to people of all ages. There are many things you can do to make your home safe and to help prevent falls. What can I do on the outside of my home? Regularly fix the edges of walkways and driveways and fix any cracks. Remove anything that might make you trip as you walk through a door, such as a raised step or threshold. Trim any bushes or trees on the path to your home. Use bright outdoor lighting. Clear any walking paths of anything that might make someone trip, such as rocks or  tools. Regularly check to see if handrails are loose or broken. Make sure that both sides of any steps have handrails. Any raised decks and porches should have guardrails on the edges. Have any leaves, snow, or ice cleared regularly. Use sand or salt on walking paths during winter. Clean up any spills in your garage right away. This includes oil or grease spills. What can I do in the bathroom? Use night lights. Install grab bars by the toilet and in the tub and shower. Do not use towel bars as grab bars. Use non-skid mats or decals in the tub or shower. If you need to sit down in the shower, use a plastic, non-slip stool. Keep the floor dry. Clean up any water that spills on the floor as soon as it happens. Remove soap buildup in the tub or shower regularly. Attach bath mats securely with double-sided non-slip rug tape. Do not have throw rugs and other things on the floor that can make you trip. What can I do in the bedroom? Use night lights. Make sure that you have a light by your bed  that is easy to reach. Do not use any sheets or blankets that are too big for your bed. They should not hang down onto the floor. Have a firm chair that has side arms. You can use this for support while you get dressed. Do not have throw rugs and other things on the floor that can make you trip. What can I do in the kitchen? Clean up any spills right away. Avoid walking on wet floors. Keep items that you use a lot in easy-to-reach places. If you need to reach something above you, use a strong step stool that has a grab bar. Keep electrical cords out of the way. Do not use floor polish or wax that makes floors slippery. If you must use wax, use non-skid floor wax. Do not have throw rugs and other things on the floor that can make you trip. What can I do with my stairs? Do not leave any items on the stairs. Make sure that there are handrails on both sides of the stairs and use them. Fix handrails that are  broken or loose. Make sure that handrails are as long as the stairways. Check any carpeting to make sure that it is firmly attached to the stairs. Fix any carpet that is loose or worn. Avoid having throw rugs at the top or bottom of the stairs. If you do have throw rugs, attach them to the floor with carpet tape. Make sure that you have a light switch at the top of the stairs and the bottom of the stairs. If you do not have them, ask someone to add them for you. What else can I do to help prevent falls? Wear shoes that: Do not have high heels. Have rubber bottoms. Are comfortable and fit you well. Are closed at the toe. Do not wear sandals. If you use a stepladder: Make sure that it is fully opened. Do not climb a closed stepladder. Make sure that both sides of the stepladder are locked into place. Ask someone to hold it for you, if possible. Clearly mark and make sure that you can see: Any grab bars or handrails. First and last steps. Where the edge of each step is. Use tools that help you move around (mobility aids) if they are needed. These include: Canes. Walkers. Scooters. Crutches. Turn on the lights when you go into a dark area. Replace any light bulbs as soon as they burn out. Set up your furniture so you have a clear path. Avoid moving your furniture around. If any of your floors are uneven, fix them. If there are any pets around you, be aware of where they are. Review your medicines with your doctor. Some medicines can make you feel dizzy. This can increase your chance of falling. Ask your doctor what other things that you can do to help prevent falls. This information is not intended to replace advice given to you by your health care provider. Make sure you discuss any questions you have with your health care provider. Document Released: 05/11/2009 Document Revised: 12/21/2015 Document Reviewed: 08/19/2014 Elsevier Interactive Patient Education  2017 [employer].

## 2022-12-10 ENCOUNTER — Telehealth (HOSPITAL_COMMUNITY): Payer: Self-pay | Admitting: *Deleted

## 2022-12-10 NOTE — Telephone Encounter (Signed)
Reaching out to patient to offer assistance regarding upcoming cardiac imaging study; pt verbalizes understanding of appt date/time, parking situation and where to check in, pre-test NPO status and medications ordered, and verified current allergies; name and call back number provided for further questions should they arise  Rishawn Walck RN Navigator Cardiac Imaging [facility] [phone] office [phone] cell  Patient to take 100mg metoprolol tartrate two hours prior to her cardiac CT scan. She is aware to arrive at 11:30 am. 

## 2022-12-11 ENCOUNTER — Ambulatory Visit (HOSPITAL_COMMUNITY)
Admission: RE | Admit: 2022-12-11 | Discharge: 2022-12-11 | Disposition: A | Discharge status: Home / Self Care | Payer: Medicare HMO | Source: Ambulatory Visit | Attending: Internal Medicine | Admitting: Internal Medicine

## 2022-12-11 DIAGNOSIS — R0609 Other forms of dyspnea: Secondary | ICD-10-CM | POA: Insufficient documentation

## 2022-12-11 DIAGNOSIS — Q2111 Secundum atrial septal defect: Secondary | ICD-10-CM | POA: Diagnosis not present

## 2022-12-11 DIAGNOSIS — R072 Precordial pain: Secondary | ICD-10-CM | POA: Diagnosis not present

## 2022-12-11 MED ORDER — IOHEXOL 350 MG/ML SOLN
100.0000 mL | Freq: Once | INTRAVENOUS | Status: AC | PRN
  Administered 2022-12-11: 100 mL via INTRAVENOUS

## 2022-12-11 MED ORDER — NITROGLYCERIN 0.4 MG SL SUBL
SUBLINGUAL_TABLET | SUBLINGUAL | Status: AC
  Filled 2022-12-11: qty 2

## 2022-12-11 MED ORDER — NITROGLYCERIN 0.4 MG SL SUBL
0.8000 mg | SUBLINGUAL_TABLET | SUBLINGUAL | Status: DC | PRN
  Administered 2022-12-11: 0.8 mg via SUBLINGUAL

## 2022-12-11 NOTE — Progress Notes (Signed)
Patient tolerated without distress

## 2022-12-12 ENCOUNTER — Telehealth: Payer: Self-pay

## 2022-12-12 NOTE — Telephone Encounter (Signed)
Attempted to call patient with results- no answer/no voicemail- MyChart message sent.

## 2022-12-12 NOTE — Telephone Encounter (Signed)
-----   Message from Marjo Bicker, MD sent at 12/11/2022  6:50 PM EDT ----- Coronary calcium score is 0. No blockages in the heart vessels.

## 2022-12-15 NOTE — Progress Notes (Signed)
Cynthia Leon, female    DOB: Sep 16, 1950    MRN: [id number]   Brief patient profile:  71   yowf never smoker referred to pulmonary clinic in [place]  12/16/2022 by unexplained doe x years prior to covid then  onset abrupt worsening assoc with chest tightness came on suddenly while bending down reaching under cabinet in November 04 2022 in setting of chronic cough "but always has sinus" x ? Years   Chest tightness present 24 / 7 since onset no better with pred with nl CT coronary study 12/11/22     History of Present Illness  12/16/2022  Pulmonary/ 1st office eval/ [facility] Mcaffee / [place] [facility]  Chief Complaint  Patient presents with   Pulmonary Consult    Referred by [name], FNP.  Pt c/o SOB for the past month- all of the time with or without exertion. She has a prod cough with white sputum.   Dyspnea:  tired more than sob and R hip and both quads aching  Cough: immediately on lying down dry and wakes up sev times per night coughing for  over a year on zegrid otc in am  Sleep: flat bed, one pillow  SABA use: none  02: none  Lots of double mints    No obvious other day to day or daytime pattern/variability or assoc excess/ purulent sputum or mucus plugs or hemoptysis or cp or chest tightness, subjective wheeze or overt sinus or hb symptoms.     Also denies any obvious fluctuation of symptoms with weather or environmental changes or other aggravating or alleviating factors except as outlined above   No unusual exposure hx or h/o childhood pna/ asthma or knowledge of premature birth.  Current Allergies, Complete Past Medical History, Past Surgical History, Family History, and Social History were reviewed in [employer] record.  ROS  The following are not active complaints unless bolded Hoarseness, sore throat, dysphagia, dental problems, itching, sneezing,  nasal congestion or discharge of excess mucus or purulent secretions, ear ache,   fever, chills,  sweats, unintended wt loss or wt gain, classically pleuritic or exertional cp,  orthopnea pnd or arm/hand swelling  or leg swelling, presyncope, palpitations, abdominal pain, anorexia, nausea, vomiting, diarrhea  or change in bowel habits or change in bladder habits, change in stools or change in urine, dysuria, hematuria,  rash, arthralgias, visual complaints, headache, numbness, weakness or ataxia or problems with walking or coordination,  change in mood or  memory.             Past Medical History:  Diagnosis Date   Abdominal pain    Allergic rhinitis    Anxiety    Constipation    Fibromyalgia    GERD (gastroesophageal reflux disease)    Head ache    Hyperlipidemia    Osteopenia    PUD (peptic ulcer disease)    Right hip pain    Shingles     Outpatient Medications Prior to Visit  Medication Sig Dispense Refill   ALPRAZolam (XANAX) 0.5 MG tablet Take 1 tablet (0.5 mg total) by mouth at bedtime as needed for anxiety. 30 tablet 2   Calcium & Magnesium Carbonates (MYLANTA PO) Take by mouth as needed.     cyclobenzaprine (FLEXERIL) 10 MG tablet TAKE 1 TABLET BY MOUTH THREE TIMES A DAY AS NEEDED FOR MUSCLE SPASM 90 tablet 2   DULoxetine (CYMBALTA) 60 MG capsule TAKE 1 CAPSULE BY MOUTH EVERY DAY 90 capsule 0   Omeprazole-Sodium  Bicarbonate (ZEGERID OTC) 20-1100 MG CAPS capsule Take 1 capsule by mouth daily before breakfast. 28 each 0   rosuvastatin (CRESTOR) 20 MG tablet TAKE 1 TABLET BY MOUTH EVERY DAY 90 tablet 0   topiramate (TOPAMAX) 50 MG tablet TAKE 1 TABLET BY MOUTH TWICE A DAY 180 tablet 0   cetirizine (ZYRTEC) 10 MG tablet TAKE 1 TABLET BY MOUTH EVERY DAY 90 tablet 3   diclofenac (VOLTAREN) 75 MG EC tablet TAKE 1 TABLET BY MOUTH TWICE A DAY 30 tablet 0   fluticasone (FLONASE) 50 MCG/ACT nasal spray USE 2 SPRAYS IN EACH NOSTRIL ONCE DAILY 16 g 4   gabapentin (NEURONTIN) 600 MG tablet Take 1 tablet (600 mg total) by mouth 3 (three) times daily. 90 tablet 1      Objective:      BP 132/84 (BP Location: Left Arm, Cuff Size: Normal)   Pulse 82   Ht 5\' 4"  (1.626 m)   Wt 222 lb (100.7 kg)   SpO2 95% Comment: on RA  BMI 38.11 kg/m   SpO2: 95 % (on RA)  Amb mod obese somber wf difficult historian/ very sketchy on details of various symptoms esp "when did you first notice this"    HEENT : Oropharynx  clear/ M3 airway     Nasal turbinates nl    NECK :  without  apparent JVD/ palpable Nodes/TM    LUNGS: no acc muscle use,  Nl contour chest which is clear to A and P bilaterally without cough on insp or exp maneuvers   CV:  RRR  no s3 or murmur or increase in P2, and no edema   ABD: obese  soft and nontender with nl inspiratory excursion in the supine position. No bruits or organomegaly appreciated   MS:  Nl gait/ ext warm without deformities Or obvious joint restrictions  calf tenderness, cyanosis or clubbing    SKIN: warm and dry without lesions    NEURO:  alert, approp, nl sensorium with  no motor or cerebellar deficits apparent.       I personally reviewed images and agree with radiology impression as follows:   Chest CT 12/11/22   coronary study (0 score) Minimal bibasilar volume loss or consolidation. Otherwise no significant extracardiac incidental findings identified.    Assessment   DOE (dyspnea on exertion) Onset of doe "years before covid" (was able to walk 300 ft to daughter's house prior to onsetbut none since  Chest CT 12/11/22   coronary study (0 score) - 12/16/2022   Walked on RA  x  3  lap(s) =  approx 450  ft  @ mod pace, stopped due to sob with lowest 02 sats 97% and worse pain in quads sym bilaterally  - FENO  12/16/2022  = 61    Symptoms are markedly disproportionate to objective findings and not clear to what extent this is actually a pulmonary  problem but pt does appear to have difficult to sort out respiratory symptoms of unknown origin for which  DDX  = almost all start with A and  include Adherence, Ace Inhibitors, Acid Reflux,  Active Sinus Disease, Alpha 1 Antitripsin deficiency, Anxiety masquerading as Airways dz,  ABPA,  Allergy(esp in young), Aspiration (esp in elderly), Adverse effects of meds,  Active smoking or Vaping, A bunch of PE's/clot burden (a few small clots can't cause this syndrome unless there is already severe underlying pulm or vascular dz with poor reserve),  Anemia or thyroid disorder, plus two Bs  =  Bronchiectasis and Beta blocker use..and one C= CHF    Adherence is always the initial "prime suspect" and is a multilayered concern that requires a "trust but verify" approach in every patient - starting with knowing how to use medications, especially inhalers, correctly, keeping up with refills and understanding the fundamental difference between maintenance and prns vs those medications only taken for a very short course and then stopped and not refilled.  - return in 6 weeks Adherence is always the initial "prime suspect" and is a multilayered concern that requires a "trust but verify" approach in every patient - starting with knowing how to use medications, especially inhalers, correctly, keeping up with refills and understanding the fundamental difference between maintenance and prns vs those medications only taken for a very short course and then stopped and not refilled. 3  ? Acid (or non-acid) GERD > always difficult to exclude as up to 75% of pts in some series report no assoc GI/ Heartburn symptoms though she has noncardiac cp adding to evidence> rec max (24h)  acid suppression and diet restrictions/ reviewed and instructions given in writing.   ? Allergy /asthma > despite Pos FENO no reported response to steroids/ no wheeze on exam > try rx gerd first and check baseline EOS/ IgE levels   ? ? Anxiety/deconditioning/ wt gain > usually at the bottom of this list of usual suspects but  note already on psychotropics and may interfere with adherence and also interpretation of response or lack thereof to  symptom management which can be quite subjective.   ? Adverse drug effects > concerned about high dose crestor > check CPK   ? A bunch of PEs > send d dimer   ? CHF / coronary study reassuring but could still have "cardiac asthma" > check echo/bnp pending   Each maintenance medication was reviewed in detail including emphasizing most importantly the difference between maintenance and prns and under what circumstances the prns are to be triggered using an action plan format where appropriate.  Total time for H and P, chart review, counseling,  directly observing portions of ambulatory 02 saturation study/ and generating customized AVS unique to this office visit / same day charting = > 60 min new pt eval                  Sandrea Hughs, MD 12/16/2022

## 2022-12-16 ENCOUNTER — Ambulatory Visit: Payer: Medicare HMO | Admitting: Internal Medicine

## 2022-12-16 ENCOUNTER — Encounter: Payer: Self-pay | Admitting: Internal Medicine

## 2022-12-16 VITALS — BP 132/84 | HR 82 | Ht 64.0 in | Wt 222.0 lb

## 2022-12-16 DIAGNOSIS — R0609 Other forms of dyspnea: Secondary | ICD-10-CM | POA: Diagnosis not present

## 2022-12-16 LAB — NITRIC OXIDE: Nitric Oxide: 63

## 2022-12-16 MED ORDER — PANTOPRAZOLE SODIUM 40 MG PO TBEC
40.0000 mg | DELAYED_RELEASE_TABLET | Freq: Every day | ORAL | 2 refills | Status: DC
Start: 2022-12-16 — End: 2023-01-06

## 2022-12-16 MED ORDER — FAMOTIDINE 20 MG PO TABS: ORAL_TABLET | ORAL | 11 refills | Status: DC

## 2022-12-16 NOTE — Assessment & Plan Note (Addendum)
Onset of doe "years before covid" (was able to walk 300 ft to daughter's house prior to onsetbut none since  Chest CT 12/11/22   coronary study (0 score) - 12/16/2022   Walked on RA  x  3  lap(s) =  approx 450  ft  @ mod pace, stopped due to sob with lowest 02 sats 97% and worse pain in quads sym bilaterally  - FENO  12/16/2022  = 61    Symptoms are markedly disproportionate to objective findings and not clear to what extent this is actually a pulmonary  problem but pt does appear to have difficult to sort out respiratory symptoms of unknown origin for which  DDX  = almost all start with A and  include Adherence, Ace Inhibitors, Acid Reflux, Active Sinus Disease, Alpha 1 Antitripsin deficiency, Anxiety masquerading as Airways dz,  ABPA,  Allergy(esp in young), Aspiration (esp in elderly), Adverse effects of meds,  Active smoking or Vaping, A bunch of PE's/clot burden (a few small clots can't cause this syndrome unless there is already severe underlying pulm or vascular dz with poor reserve),  Anemia or thyroid disorder, plus two Bs  = Bronchiectasis and Beta blocker use..and one C= CHF    Adherence is always the initial "prime suspect" and is a multilayered concern that requires a "trust but verify" approach in every patient - starting with knowing how to use medications, especially inhalers, correctly, keeping up with refills and understanding the fundamental difference between maintenance and prns vs those medications only taken for a very short course and then stopped and not refilled.  - return in 6 weeks Adherence is always the initial "prime suspect" and is a multilayered concern that requires a "trust but verify" approach in every patient - starting with knowing how to use medications, especially inhalers, correctly, keeping up with refills and understanding the fundamental difference between maintenance and prns vs those medications only taken for a very short course and then stopped and not refilled.  3  ? Acid (or non-acid) GERD > always difficult to exclude as up to 75% of pts in some series report no assoc GI/ Heartburn symptoms though she has noncardiac cp adding to evidence> rec max (24h)  acid suppression and diet restrictions/ reviewed and instructions given in writing.   ? Allergy /asthma > despite Pos FENO no reported response to steroids/ no wheeze on exam > try rx gerd first and check baseline EOS/ IgE levels   ? ? Anxiety/deconditioning/ wt gain > usually at the bottom of this list of usual suspects but  note already on psychotropics and may interfere with adherence and also interpretation of response or lack thereof to symptom management which can be quite subjective.   ? Adverse drug effects > concerned about high dose crestor > check CPK   ? A bunch of PEs > send d dimer   ? CHF / coronary study reassuring but could still have "cardiac asthma" > check echo/bnp pending   Each maintenance medication was reviewed in detail including emphasizing most importantly the difference between maintenance and prns and under what circumstances the prns are to be triggered using an action plan format where appropriate.  Total time for H and P, chart review, counseling,  directly observing portions of ambulatory 02 saturation study/ and generating customized AVS unique to this office visit / same day charting = > 60 min new pt eval

## 2022-12-16 NOTE — Patient Instructions (Addendum)
Pantoprazole (protonix) 40 mg   Take  30-60 min before first meal of the day and Pepcid (famotidine)  20 mg after supper until return to office - this is the best way to tell whether stomach acid is contributing to your problem.    GERD (REFLUX)  is an extremely common cause of respiratory symptoms just like yours , many times with no obvious heartburn at all.    It can be treated with medication, but also with lifestyle changes including elevation of the head of your bed (ideally with 6 -8inch blocks under the headboard of your bed),  Smoking cessation, avoidance of late meals, excessive alcohol, and avoid fatty foods, chocolate, peppermint, colas, red wine, and acidic juices such as orange juice.  NO MINT OR MENTHOL PRODUCTS SO NO COUGH DROPS  USE SUGARLESS CANDY INSTEAD (Jolley ranchers or Stover's or Life Savers) or even ice chips will also do - the key is to swallow to prevent all throat clearing. NO OIL BASED VITAMINS - use powdered substitutes.  Avoid fish oil when coughing.    Muscle aches may be from your crestor at 20 mg per day   Please remember to go to the lab department   for your tests - we will call you with the results when they are available.     Please schedule a follow up office visit in 6 weeks, call sooner if needed with all medications /inhalers/ solutions in hand so we can verify exactly what you are taking. This includes all medications from all doctors and over the counters

## 2022-12-29 LAB — CBC WITH DIFFERENTIAL/PLATELET
Basophils Absolute: 0.1 10*3/uL (ref 0.0–0.2)
Basos: 1 %
EOS (ABSOLUTE): 0.5 10*3/uL — ABNORMAL HIGH (ref 0.0–0.4)
Eos: 6 %
Hematocrit: 41.5 % (ref 34.0–46.6)
Hemoglobin: 13.8 g/dL (ref 11.1–15.9)
Immature Grans (Abs): 0 10*3/uL (ref 0.0–0.1)
Immature Granulocytes: 0 %
Lymphocytes Absolute: 2.5 10*3/uL (ref 0.7–3.1)
Lymphs: 29 %
MCH: 29.1 pg (ref 26.6–33.0)
MCHC: 33.3 g/dL (ref 31.5–35.7)
MCV: 87 fL (ref 79–97)
Monocytes Absolute: 0.7 10*3/uL (ref 0.1–0.9)
Monocytes: 8 %
Neutrophils Absolute: 4.7 10*3/uL (ref 1.4–7.0)
Neutrophils: 56 %
Platelets: 289 10*3/uL (ref 150–450)
RBC: 4.75 x10E6/uL (ref 3.77–5.28)
RDW: 13.2 % (ref 11.7–15.4)
WBC: 8.4 10*3/uL (ref 3.4–10.8)

## 2022-12-29 LAB — D-DIMER, QUANTITATIVE: D-DIMER: 0.49 mg/L FEU (ref 0.00–0.49)

## 2022-12-29 LAB — BRAIN NATRIURETIC PEPTIDE: BNP: 8.7 pg/mL (ref 0.0–100.0)

## 2023-01-02 ENCOUNTER — Other Ambulatory Visit (HOSPITAL_COMMUNITY): Payer: Medicare HMO

## 2023-01-04 ENCOUNTER — Other Ambulatory Visit: Payer: Self-pay | Admitting: Internal Medicine

## 2023-01-04 ENCOUNTER — Other Ambulatory Visit: Payer: Self-pay | Admitting: Family

## 2023-01-04 DIAGNOSIS — R0789 Other chest pain: Secondary | ICD-10-CM

## 2023-01-04 DIAGNOSIS — F411 Generalized anxiety disorder: Secondary | ICD-10-CM

## 2023-01-04 DIAGNOSIS — E785 Hyperlipidemia, unspecified: Secondary | ICD-10-CM

## 2023-01-04 DIAGNOSIS — M797 Fibromyalgia: Secondary | ICD-10-CM

## 2023-01-08 ENCOUNTER — Ambulatory Visit: Payer: Medicare HMO | Attending: Internal Medicine

## 2023-01-08 DIAGNOSIS — R0609 Other forms of dyspnea: Secondary | ICD-10-CM

## 2023-01-08 LAB — ECHOCARDIOGRAM COMPLETE
Area-P 1/2: 2.55 cm2
S' Lateral: 2.8 cm

## 2023-01-14 ENCOUNTER — Telehealth: Payer: Self-pay

## 2023-01-14 DIAGNOSIS — R0609 Other forms of dyspnea: Secondary | ICD-10-CM

## 2023-01-14 MED ORDER — FUROSEMIDE 20 MG PO TABS
20.0000 mg | ORAL_TABLET | Freq: Every day | ORAL | Status: DC
Start: 2023-01-14 — End: 2023-01-14

## 2023-01-14 MED ORDER — FUROSEMIDE 20 MG PO TABS: 20.0000 mg | ORAL_TABLET | Freq: Every day | ORAL | 3 refills | Status: DC

## 2023-01-14 NOTE — Telephone Encounter (Signed)
Spoke with Juanda Crumble (Spouse) regarding Echo results and advise him that she will be starting Lasix daily, sent in to pharmacy. Patient verbalized understanding.

## 2023-01-14 NOTE — Telephone Encounter (Signed)
-----   Message from Marjo Bicker, MD sent at 01/09/2023  1:04 PM EDT ----- Normal pumping function of the heart, G1 DD (mild stiff heart) and no valvular disease. Right side of the heart has normal function and normal size.  Her DOE symptoms could likely be secondary to HFpEF, start Lasix 20 mg once daily and reassess symptoms.

## 2023-01-28 ENCOUNTER — Ambulatory Visit: Payer: Medicare HMO | Admitting: Internal Medicine

## 2023-01-28 ENCOUNTER — Encounter: Payer: Self-pay | Admitting: Internal Medicine

## 2023-01-28 VITALS — BP 116/63 | HR 80 | Ht 66.0 in | Wt 221.4 lb

## 2023-01-28 DIAGNOSIS — R0609 Other forms of dyspnea: Secondary | ICD-10-CM | POA: Diagnosis not present

## 2023-01-28 NOTE — Progress Notes (Signed)
Cynthia Leon, female    DOB: 11-29-1950    MRN: [id number]   Brief patient profile:  7   yowf never smoker referred to pulmonary clinic in [place]  12/16/2022 by unexplained doe x years prior to covid then  onset abrupt worsening assoc with chest tightness came on suddenly while bending down reaching under cabinet in November 04 2022 in setting of chronic cough "but always has sinus" x ? Years   Chest tightness present 24 / 7 since onset no better with pred with nl CT coronary study 12/11/22     History of Present Illness  12/16/2022  Pulmonary/ 1st office eval/ Jefrey Raburn / [place] [facility]  Chief Complaint  Patient presents with   Pulmonary Consult    Referred by [name], FNP.  Pt c/o SOB for the past month- all of the time with or without exertion. She has a prod cough with white sputum.   Dyspnea:  tired more than sob and R hip and both quads aching  Cough: immediately on lying down dry and wakes up sev times per night coughing for  over a year on zegrid otc in am  Sleep: flat bed, one pillow  SABA use: none  02: none  Lots of double mints   Rec Pantoprazole (protonix) 40 mg   Take  30-60 min before first meal of the day and Pepcid (famotidine)  20 mg after supper until return to office  GERD diet reviewed, bed blocks rec  Muscle aches may be from your crestor at 20 mg per day  Please schedule a follow up office visit in 6 weeks, call sooner if needed with all medications /inhalers/ solutions in hand    01/28/2023  f/u ov/[place] office/Leven Hoel re: coiugh  maint on gerd rx did not  bring meds / still on crestor but no longer hurting in quads Chief Complaint  Patient presents with   Follow-up  Dyspnea:  Not limited by breathing from desired activities  / no chest tightness  Housework/ yardwork ok/ some aches in shoulders/knees  Cough: gone Sleeping: flat bed one pillow  SABA use: none  02: none      No obvious day to day or daytime variability or assoc excess/  purulent sputum or mucus plugs or hemoptysis or cp or chest tightness, subjective wheeze or overt sinus or hb symptoms.   Sleeping as above without nocturnal  or early am exacerbation  of respiratory  c/o's or need for noct saba. Also denies any obvious fluctuation of symptoms with weather or environmental changes or other aggravating or alleviating factors except as outlined above   No unusual exposure hx or h/o childhood pna/ asthma or knowledge of premature birth.  Current Allergies, Complete Past Medical History, Past Surgical History, Family History, and Social History were reviewed in [employer] record.  ROS  The following are not active complaints unless bolded Hoarseness, sore throat, dysphagia, dental problems, itching, sneezing,  nasal congestion or discharge of excess mucus or purulent secretions, ear ache,   fever, chills, sweats, unintended wt loss or wt gain, classically pleuritic or exertional cp,  orthopnea pnd or arm/hand swelling  or leg swelling, presyncope, palpitations, abdominal pain, anorexia, nausea, vomiting, diarrhea  or change in bowel habits or change in bladder habits, change in stools or change in urine, dysuria, hematuria,  rash, arthralgias, visual complaints, headache, numbness, weakness or ataxia or problems with walking or coordination,  change in mood or  memory.  Current Meds  Medication Sig   ALPRAZolam (XANAX) 0.5 MG tablet Take 1 tablet (0.5 mg total) by mouth at bedtime as needed for anxiety.   Calcium & Magnesium Carbonates (MYLANTA PO) Take by mouth as needed.   cyclobenzaprine (FLEXERIL) 10 MG tablet TAKE 1 TABLET BY MOUTH THREE TIMES A DAY AS NEEDED FOR MUSCLE SPASM   DULoxetine (CYMBALTA) 60 MG capsule TAKE 1 CAPSULE BY MOUTH EVERY DAY   famotidine (PEPCID) 20 MG tablet One after supper   furosemide (LASIX) 20 MG tablet Take 1 tablet (20 mg total) by mouth daily.   pantoprazole (PROTONIX) 40 MG tablet TAKE 1 TABLET (40 MG  TOTAL) BY MOUTH DAILY. TAKE 30-60 MIN BEFORE FIRST MEAL OF THE DAY   rosuvastatin (CRESTOR) 20 MG tablet TAKE 1 TABLET BY MOUTH EVERY DAY   topiramate (TOPAMAX) 50 MG tablet TAKE 1 TABLET BY MOUTH TWICE A DAY                Past Medical History:  Diagnosis Date   Abdominal pain    Allergic rhinitis    Anxiety    Constipation    Fibromyalgia    GERD (gastroesophageal reflux disease)    Head ache    Hyperlipidemia    Osteopenia    PUD (peptic ulcer disease)    Right hip pain    Shingles       Objective:    Wt Readings from Last 3 Encounters:  01/28/23 221 lb 6.4 oz (100.4 kg)  12/16/22 222 lb (100.7 kg)  12/05/22 221 lb (100.2 kg)      Vital signs reviewed  01/28/2023  - Note at rest 02 sats  94% on RA   General appearance:    amb wf nad   HEENT : Oropharynx  clear        NECK :  without  apparent JVD/ palpable Nodes/TM    LUNGS: no acc muscle use,  Nl contour chest which is clear to A and P bilaterally without cough on insp or exp maneuvers   CV:  RRR  no s3 or murmur or increase in P2, and no edema   ABD:  soft and nontender with nl inspiratory excursion in the supine position. No bruits or organomegaly appreciated   MS:  Nl gait/ ext warm without deformities Or obvious joint restrictions  calf tenderness, cyanosis or clubbing    SKIN: warm and dry without lesions    NEURO:  alert, approp, nl sensorium with  no motor or cerebellar deficits apparent.        Assessment

## 2023-01-28 NOTE — Assessment & Plan Note (Addendum)
Onset of doe "years before covid" (was able to walk 300 ft to daughter's house prior to onsetbut none since)  Chest CT 12/11/22   coronary study (0 score) - 12/16/2022   Walked on RA  x  3  lap(s) =  approx 450  ft  @ mod pace, stopped due to sob with lowest 02 sats 97% and worse pain in quads sym bilaterally  - FENO  12/16/2022  = 61   - EOS  12/16/22    0.5    Improved to her satisfaction on gerd rx  with no more quad pain despite continued use of crestor so likely not the culprit and f/u can be per PCP          Each maintenance medication was reviewed in detail including emphasizing most importantly the difference between maintenance and prns and under what circumstances the prns are to be triggered using an action plan format where appropriate.  Total time for H and P, chart review, counseling,  and generating customized AVS unique to this office visit / same day charting = 30 min summary final f/u ov

## 2023-01-28 NOTE — Patient Instructions (Signed)
Take any ibuprofen with meals   Take all of your medications (includes any over the counter) with you to see your PCP to regroup re long term medications   No pulmonary follow up is needed - if need to return please bring all medications

## 2023-02-03 ENCOUNTER — Other Ambulatory Visit: Payer: Self-pay | Admitting: Family

## 2023-02-03 ENCOUNTER — Ambulatory Visit: Payer: Medicare HMO

## 2023-02-03 ENCOUNTER — Encounter: Payer: Self-pay | Admitting: Family

## 2023-02-03 ENCOUNTER — Ambulatory Visit
Admission: RE | Admit: 2023-02-03 | Discharge: 2023-02-03 | Disposition: A | Discharge status: Home / Self Care | Payer: Medicare HMO | Source: Ambulatory Visit | Attending: Family | Admitting: Family

## 2023-02-03 ENCOUNTER — Ambulatory Visit (INDEPENDENT_AMBULATORY_CARE_PROVIDER_SITE_OTHER): Payer: Medicare HMO | Admitting: Family

## 2023-02-03 VITALS — BP 123/67 | HR 83 | Temp 97.3°F | Ht 66.0 in | Wt 220.0 lb

## 2023-02-03 DIAGNOSIS — K5900 Constipation, unspecified: Secondary | ICD-10-CM

## 2023-02-03 DIAGNOSIS — E559 Vitamin D deficiency, unspecified: Secondary | ICD-10-CM | POA: Diagnosis not present

## 2023-02-03 DIAGNOSIS — M159 Polyosteoarthritis, unspecified: Secondary | ICD-10-CM | POA: Diagnosis not present

## 2023-02-03 DIAGNOSIS — F1320 Sedative, hypnotic or anxiolytic dependence, uncomplicated: Secondary | ICD-10-CM

## 2023-02-03 DIAGNOSIS — K219 Gastro-esophageal reflux disease without esophagitis: Secondary | ICD-10-CM

## 2023-02-03 DIAGNOSIS — Z1231 Encounter for screening mammogram for malignant neoplasm of breast: Secondary | ICD-10-CM

## 2023-02-03 DIAGNOSIS — Z79899 Other long term (current) drug therapy: Secondary | ICD-10-CM

## 2023-02-03 DIAGNOSIS — M150 Primary generalized (osteo)arthritis: Secondary | ICD-10-CM

## 2023-02-03 DIAGNOSIS — E1142 Type 2 diabetes mellitus with diabetic polyneuropathy: Secondary | ICD-10-CM | POA: Diagnosis not present

## 2023-02-03 DIAGNOSIS — E785 Hyperlipidemia, unspecified: Secondary | ICD-10-CM | POA: Diagnosis not present

## 2023-02-03 DIAGNOSIS — Z6835 Body mass index (BMI) 35.0-35.9, adult: Secondary | ICD-10-CM

## 2023-02-03 DIAGNOSIS — F411 Generalized anxiety disorder: Secondary | ICD-10-CM

## 2023-02-03 LAB — BAYER DCA HB A1C WAIVED: HB A1C (BAYER DCA - WAIVED): 5.9 % — ABNORMAL HIGH (ref 4.8–5.6)

## 2023-02-03 MED ORDER — ALPRAZOLAM 0.5 MG PO TABS
0.5000 mg | ORAL_TABLET | Freq: Every evening | ORAL | 2 refills | Status: DC | PRN
Start: 2023-02-03 — End: 2023-05-05

## 2023-02-03 NOTE — Progress Notes (Signed)
Subjective:    Patient ID: Cynthia Leon, female    DOB: 18-Sep-1950, 72 y.o.   MRN: [id number]  Chief Complaint  Patient presents with   Medical Management of Chronic Issues    Wants to to cut back crestor because the leg pain   Pt presents to the office today for chronic follow up. Pt is morbid obese today with a BMI of 38 and co-moribility of DM and GERD.    Gastroesophageal Reflux She complains of belching and heartburn. This is a chronic problem. The current episode started more than 1 year ago. The problem occurs occasionally. Risk factors include obesity. She has tried a PPI for the symptoms. The treatment provided moderate relief.  Diabetes She presents for her follow-up diabetic visit. She has type 2 diabetes mellitus. Her disease course has been stable. Hypoglycemia symptoms include nervousness/anxiousness. Associated symptoms include blurred vision. Pertinent negatives for diabetes include no foot paresthesias. Symptoms are stable. Risk factors for coronary artery disease include dyslipidemia, diabetes mellitus, hypertension and sedentary lifestyle. She is following a generally healthy diet. (Does not check glucose )  Arthritis Presents for follow-up visit. She complains of pain and stiffness. The symptoms have been stable. Affected locations include the left knee, right knee, left MCP and right MCP. Her pain is at a severity of 5/10.  Hyperlipidemia This is a chronic problem. The current episode started more than 1 year ago. The problem is controlled. Exacerbating diseases include obesity. Current antihyperlipidemic treatment includes statins. The current treatment provides moderate improvement of lipids. Risk factors for coronary artery disease include diabetes mellitus, dyslipidemia, hypertension and a sedentary lifestyle.  Anxiety Presents for follow-up visit. Symptoms include depressed mood, excessive worry, nervous/anxious behavior and restlessness. Patient reports no  panic. Symptoms occur most days. The severity of symptoms is moderate.        Review of Systems  Eyes:  Positive for blurred vision.  Gastrointestinal:  Positive for heartburn.  Musculoskeletal:  Positive for arthritis and stiffness.  Psychiatric/Behavioral:  The patient is nervous/anxious.   All other systems reviewed and are negative.      Objective:   Physical Exam Vitals reviewed.  Constitutional:      General: She is not in acute distress.    Appearance: She is well-developed. She is obese.  HENT:     Head: Normocephalic and atraumatic.     Right Ear: Tympanic membrane normal.     Left Ear: Tympanic membrane normal.  Eyes:     Pupils: Pupils are equal, round, and reactive to light.  Neck:     Thyroid: No thyromegaly.  Cardiovascular:     Rate and Rhythm: Normal rate and regular rhythm.     Heart sounds: Normal heart sounds. No murmur heard. Pulmonary:     Effort: Pulmonary effort is normal. No respiratory distress.     Breath sounds: Normal breath sounds. No wheezing.  Abdominal:     General: Bowel sounds are normal. There is no distension.     Palpations: Abdomen is soft.     Tenderness: There is no abdominal tenderness.  Musculoskeletal:        General: No tenderness. Normal range of motion.     Cervical back: Normal range of motion and neck supple.  Skin:    General: Skin is warm and dry.  Neurological:     Mental Status: She is alert and oriented to person, place, and time.     Cranial Nerves: No cranial nerve deficit.  Deep Tendon Reflexes: Reflexes are normal and symmetric.  Psychiatric:        Behavior: Behavior normal.        Thought Content: Thought content normal.        Judgment: Judgment normal.     Diabetic Foot Exam - Simple   Simple Foot Form Diabetic Foot exam was performed with the following findings: Yes 02/03/2023 11:29 AM  Visual Inspection No deformities, no ulcerations, no other skin breakdown bilaterally: Yes Sensation  Testing Intact to touch and monofilament testing bilaterally: Yes Pulse Check Posterior Tibialis and Dorsalis pulse intact bilaterally: Yes Comments      BP 123/67   Pulse 83   Temp (!) 97.3 F (36.3 C) (Temporal)   Ht 5\' 6"  (1.676 m)   Wt 220 lb (99.8 kg)   SpO2 97%   BMI 35.51 kg/m      Assessment & Plan:  Cynthia Leon comes in today with chief complaint of Medical Management of Chronic Issues (Wants to to cut back crestor because the leg pain)   Diagnosis and orders addressed:  1. GAD (generalized anxiety disorder) - ToxASSURE Select 13 (MW), Urine - ALPRAZolam (XANAX) 0.5 MG tablet; Take 1 tablet (0.5 mg total) by mouth at bedtime as needed for anxiety.  Dispense: 30 tablet; Refill: 2 - CMP14+EGFR  2. Benzodiazepine dependence (HCC) - ToxASSURE Select 13 (MW), Urine - ALPRAZolam (XANAX) 0.5 MG tablet; Take 1 tablet (0.5 mg total) by mouth at bedtime as needed for anxiety.  Dispense: 30 tablet; Refill: 2 - CMP14+EGFR  3. Controlled substance agreement signed - ToxASSURE Select 13 (MW), Urine - ALPRAZolam (XANAX) 0.5 MG tablet; Take 1 tablet (0.5 mg total) by mouth at bedtime as needed for anxiety.  Dispense: 30 tablet; Refill: 2 - CMP14+EGFR  4. Constipation, unspecified constipation type - CMP14+EGFR  5. Diabetic polyneuropathy associated with type 2 diabetes mellitus (HCC) - Bayer DCA Hb A1c Waived - CMP14+EGFR  6. Type 2 diabetes mellitus with diabetic polyneuropathy, without long-term current use of insulin (HCC) - Bayer DCA Hb A1c Waived - CMP14+EGFR  7. Gastroesophageal reflux disease, unspecified whether esophagitis present - CMP14+EGFR  8. Hyperlipidemia, unspecified hyperlipidemia type - CMP14+EGFR  9. Vitamin D deficiency - CMP14+EGFR  10. Primary osteoarthritis involving multiple joints - CMP14+EGFR  11. Morbid obesity (HCC) - CMP14+EGFR   Labs pending Continue medications  Patient reviewed in [place] controlled database, no flags  noted. Contract and drug screen are up to date.  Health Maintenance reviewed Diet and exercise encouraged  Follow up plan: 3 months   [name], FNP

## 2023-02-03 NOTE — Patient Instructions (Signed)
Health Maintenance After Age 73 After age 73, you are at a higher risk for certain long-term diseases and infections as well as injuries from falls. Falls are a major cause of broken bones and head injuries in people who are older than age 73. Getting regular preventive care can help to keep you healthy and well. Preventive care includes getting regular testing and making lifestyle changes as recommended by your health care provider. Talk with your health care provider about: Which screenings and tests you should have. A screening is a test that checks for a disease when you have no symptoms. A diet and exercise plan that is right for you. What should I know about screenings and tests to prevent falls? Screening and testing are the best ways to find a health problem early. Early diagnosis and treatment give you the best chance of managing medical conditions that are common after age 73. Certain conditions and lifestyle choices may make you more likely to have a fall. Your health care provider may recommend: Regular vision checks. Poor vision and conditions such as cataracts can make you more likely to have a fall. If you wear glasses, make sure to get your prescription updated if your vision changes. Medicine review. Work with your health care provider to regularly review all of the medicines you are taking, including over-the-counter medicines. Ask your health care provider about any side effects that may make you more likely to have a fall. Tell your health care provider if any medicines that you take make you feel dizzy or sleepy. Strength and balance checks. Your health care provider may recommend certain tests to check your strength and balance while standing, walking, or changing positions. Foot health exam. Foot pain and numbness, as well as not wearing proper footwear, can make you more likely to have a fall. Screenings, including: Osteoporosis screening. Osteoporosis is a condition that causes  the bones to get weaker and break more easily. Blood pressure screening. Blood pressure changes and medicines to control blood pressure can make you feel dizzy. Depression screening. You may be more likely to have a fall if you have a fear of falling, feel depressed, or feel unable to do activities that you used to do. Alcohol use screening. Using too much alcohol can affect your balance and may make you more likely to have a fall. Follow these instructions at home: Lifestyle Do not drink alcohol if: Your health care provider tells you not to drink. If you drink alcohol: Limit how much you have to: 0-1 drink a day for women. 0-2 drinks a day for men. Know how much alcohol is in your drink. In the [place] one drink equals one 12 oz bottle of beer (355 mL), one 5 oz glass of wine (148 mL), or one 1 oz glass of hard liquor (44 mL). Do not use any products that contain nicotine or tobacco. These products include cigarettes, chewing tobacco, and vaping devices, such as e-cigarettes. If you need help quitting, ask your health care provider. Activity  Follow a regular exercise program to stay fit. This will help you maintain your balance. Ask your health care provider what types of exercise are appropriate for you. If you need a cane or walker, use it as recommended by your health care provider. Wear supportive shoes that have nonskid soles. Safety  Remove any tripping hazards, such as rugs, cords, and clutter. Install safety equipment such as grab bars in bathrooms and safety rails on stairs. Keep rooms and walkways   well-lit. General instructions Talk with your health care provider about your risks for falling. Tell your health care provider if: You fall. Be sure to tell your health care provider about all falls, even ones that seem minor. You feel dizzy, tiredness (fatigue), or off-balance. Take over-the-counter and prescription medicines only as told by your health care provider. These include  supplements. Eat a healthy diet and maintain a healthy weight. A healthy diet includes low-fat dairy products, low-fat (lean) meats, and fiber from whole grains, beans, and lots of fruits and vegetables. Stay current with your vaccines. Schedule regular health, dental, and eye exams. Summary Having a healthy lifestyle and getting preventive care can help to protect your health and wellness after age 73. Screening and testing are the best way to find a health problem early and help you avoid having a fall. Early diagnosis and treatment give you the best chance for managing medical conditions that are more common for people who are older than age 73. Falls are a major cause of broken bones and head injuries in people who are older than age 73. Take precautions to prevent a fall at home. Work with your health care provider to learn what changes you can make to improve your health and wellness and to prevent falls. This information is not intended to replace advice given to you by your health care provider. Make sure you discuss any questions you have with your health care provider. Document Revised: 12/04/2020 Document Reviewed: 12/04/2020 Elsevier Patient Education  2024 [employer] Inc[employer]  

## 2023-02-04 LAB — CMP14+EGFR
ALT: 15 IU/L (ref 0–32)
AST: 14 IU/L (ref 0–40)
Albumin: 4.5 g/dL (ref 3.8–4.8)
Alkaline Phosphatase: 94 IU/L (ref 44–121)
BUN/Creatinine Ratio: 10 — ABNORMAL LOW (ref 12–28)
BUN: 8 mg/dL (ref 8–27)
Bilirubin Total: 0.3 mg/dL (ref 0.0–1.2)
CO2: 25 mmol/L (ref 20–29)
Calcium: 10 mg/dL (ref 8.7–10.3)
Chloride: 101 mmol/L (ref 96–106)
Creatinine, Ser: 0.83 mg/dL (ref 0.57–1.00)
Globulin, Total: 2.4 g/dL (ref 1.5–4.5)
Glucose: 83 mg/dL (ref 70–99)
Potassium: 4.8 mmol/L (ref 3.5–5.2)
Sodium: 139 mmol/L (ref 134–144)
Total Protein: 6.9 g/dL (ref 6.0–8.5)
eGFR: 75 mL/min/{1.73_m2} (ref >59)

## 2023-02-05 LAB — TOXASSURE SELECT 13 (MW), URINE

## 2023-03-06 DIAGNOSIS — H5203 Hypermetropia, bilateral: Secondary | ICD-10-CM | POA: Diagnosis not present

## 2023-03-06 DIAGNOSIS — H1131 Conjunctival hemorrhage, right eye: Secondary | ICD-10-CM | POA: Diagnosis not present

## 2023-03-06 DIAGNOSIS — H2513 Age-related nuclear cataract, bilateral: Secondary | ICD-10-CM | POA: Diagnosis not present

## 2023-03-06 DIAGNOSIS — H52223 Regular astigmatism, bilateral: Secondary | ICD-10-CM | POA: Diagnosis not present

## 2023-03-06 DIAGNOSIS — H524 Presbyopia: Secondary | ICD-10-CM | POA: Diagnosis not present

## 2023-03-06 LAB — HM DIABETES EYE EXAM

## 2023-05-02 ENCOUNTER — Other Ambulatory Visit: Payer: Self-pay | Admitting: Family

## 2023-05-02 DIAGNOSIS — F411 Generalized anxiety disorder: Secondary | ICD-10-CM

## 2023-05-02 DIAGNOSIS — M797 Fibromyalgia: Secondary | ICD-10-CM

## 2023-05-02 DIAGNOSIS — E785 Hyperlipidemia, unspecified: Secondary | ICD-10-CM

## 2023-05-05 ENCOUNTER — Ambulatory Visit (INDEPENDENT_AMBULATORY_CARE_PROVIDER_SITE_OTHER): Payer: Medicare HMO | Admitting: Family

## 2023-05-05 ENCOUNTER — Encounter: Payer: Self-pay | Admitting: Family

## 2023-05-05 ENCOUNTER — Ambulatory Visit (INDEPENDENT_AMBULATORY_CARE_PROVIDER_SITE_OTHER): Payer: Medicare HMO

## 2023-05-05 VITALS — BP 129/81 | HR 73 | Temp 97.5°F | Ht 66.0 in | Wt 222.2 lb

## 2023-05-05 DIAGNOSIS — F1320 Sedative, hypnotic or anxiolytic dependence, uncomplicated: Secondary | ICD-10-CM

## 2023-05-05 DIAGNOSIS — M150 Primary generalized (osteo)arthritis: Secondary | ICD-10-CM | POA: Diagnosis not present

## 2023-05-05 DIAGNOSIS — Z780 Asymptomatic menopausal state: Secondary | ICD-10-CM | POA: Diagnosis not present

## 2023-05-05 DIAGNOSIS — K5900 Constipation, unspecified: Secondary | ICD-10-CM

## 2023-05-05 DIAGNOSIS — E1142 Type 2 diabetes mellitus with diabetic polyneuropathy: Secondary | ICD-10-CM | POA: Diagnosis not present

## 2023-05-05 DIAGNOSIS — K219 Gastro-esophageal reflux disease without esophagitis: Secondary | ICD-10-CM | POA: Diagnosis not present

## 2023-05-05 DIAGNOSIS — E785 Hyperlipidemia, unspecified: Secondary | ICD-10-CM

## 2023-05-05 DIAGNOSIS — F411 Generalized anxiety disorder: Secondary | ICD-10-CM | POA: Diagnosis not present

## 2023-05-05 DIAGNOSIS — Z79899 Other long term (current) drug therapy: Secondary | ICD-10-CM

## 2023-05-05 DIAGNOSIS — E559 Vitamin D deficiency, unspecified: Secondary | ICD-10-CM | POA: Diagnosis not present

## 2023-05-05 MED ORDER — SEMAGLUTIDE(0.25 OR 0.5MG/DOS) 2 MG/3ML ~~LOC~~ SOPN
0.2500 mg | PEN_INJECTOR | SUBCUTANEOUS | 1 refills | Status: DC
Start: 2023-05-05 — End: 2023-07-24

## 2023-05-05 MED ORDER — ALPRAZOLAM 0.5 MG PO TABS
0.5000 mg | ORAL_TABLET | Freq: Every evening | ORAL | 2 refills | Status: DC | PRN
Start: 2023-05-05 — End: 2023-07-24

## 2023-05-05 NOTE — Progress Notes (Signed)
Subjective:    Patient ID: Cynthia Leon, female    DOB: [birth year], 72 y.o.   MRN: [id number]  Chief Complaint  Patient presents with   Medical Management of Chronic Issues   Pt presents to the office today for chronic follow up. Pt is morbid obese today with a BMI of 35 and co-moribility of DM and GERD.    Gastroesophageal Reflux She complains of belching and heartburn. This is a chronic problem. The current episode started more than 1 year ago. The problem occurs occasionally. Risk factors include obesity. She has tried a PPI for the symptoms. The treatment provided moderate relief.  Diabetes She presents for her follow-up diabetic visit. She has type 2 diabetes mellitus. Hypoglycemia symptoms include nervousness/anxiousness. Pertinent negatives for diabetes include no blurred vision and no foot paresthesias. Symptoms are stable. Risk factors for coronary artery disease include dyslipidemia, diabetes mellitus, hypertension, sedentary lifestyle and post-menopausal. She is following a generally healthy diet. (Does not check glucose at home) Eye exam is current.  Arthritis Presents for follow-up visit. She complains of pain and stiffness. Affected locations include the right knee, left knee, left shoulder and right shoulder. Her pain is at a severity of 8/10.  Hyperlipidemia This is a chronic problem. The current episode started more than 1 year ago. The problem is controlled. Recent lipid tests were reviewed and are normal. Exacerbating diseases include obesity. Current antihyperlipidemic treatment includes statins. The current treatment provides moderate improvement of lipids. Risk factors for coronary artery disease include dyslipidemia, diabetes mellitus, hypertension and a sedentary lifestyle.  Anxiety Presents for follow-up visit. Symptoms include excessive worry, nervous/anxious behavior and palpitations. Symptoms occur occasionally. The severity of symptoms is moderate.     Constipation This is a chronic problem. The current episode started more than 1 year ago. The problem has been resolved since onset. Risk factors include obesity. She has tried laxatives for the symptoms. The treatment provided moderate relief.      Review of Systems  Eyes:  Negative for blurred vision.  Cardiovascular:  Positive for palpitations.  Gastrointestinal:  Positive for constipation and heartburn.  Musculoskeletal:  Positive for arthritis and stiffness.  Psychiatric/Behavioral:  The patient is nervous/anxious.   All other systems reviewed and are negative.      Objective:   Physical Exam Vitals reviewed.  Constitutional:      General: She is not in acute distress.    Appearance: She is well-developed. She is obese.  HENT:     Head: Normocephalic and atraumatic.     Right Ear: Tympanic membrane normal.     Left Ear: Tympanic membrane normal.  Eyes:     Pupils: Pupils are equal, round, and reactive to light.  Neck:     Thyroid: No thyromegaly.  Cardiovascular:     Rate and Rhythm: Normal rate and regular rhythm.     Heart sounds: Normal heart sounds. No murmur heard. Pulmonary:     Effort: Pulmonary effort is normal. No respiratory distress.     Breath sounds: Normal breath sounds. No wheezing.  Abdominal:     General: Bowel sounds are normal. There is no distension.     Palpations: Abdomen is soft.     Tenderness: There is no abdominal tenderness.  Musculoskeletal:        General: No tenderness. Normal range of motion.     Cervical back: Normal range of motion and neck supple.  Skin:    General: Skin is warm and dry.  Neurological:  Mental Status: She is alert and oriented to person, place, and time.     Cranial Nerves: No cranial nerve deficit.     Deep Tendon Reflexes: Reflexes are normal and symmetric.  Psychiatric:        Behavior: Behavior normal.        Thought Content: Thought content normal.        Judgment: Judgment normal.       BP  129/81   Pulse 73   Temp (!) 97.5 F (36.4 C) (Temporal)   Ht 5\' 6"  (1.676 m)   Wt 222 lb 3.2 oz (100.8 kg)   SpO2 97%   BMI 35.86 kg/m      Assessment & Plan:  LEYANA WHIDDEN comes in today with chief complaint of Medical Management of Chronic Issues   Diagnosis and orders addressed:  1. GAD (generalized anxiety disorder) - ALPRAZolam (XANAX) 0.5 MG tablet; Take 1 tablet (0.5 mg total) by mouth at bedtime as needed for anxiety.  Dispense: 30 tablet; Refill: 2  2. Benzodiazepine dependence (HCC) - ALPRAZolam (XANAX) 0.5 MG tablet; Take 1 tablet (0.5 mg total) by mouth at bedtime as needed for anxiety.  Dispense: 30 tablet; Refill: 2  3. Controlled substance agreement signed - ALPRAZolam (XANAX) 0.5 MG tablet; Take 1 tablet (0.5 mg total) by mouth at bedtime as needed for anxiety.  Dispense: 30 tablet; Refill: 2  4. Type 2 diabetes mellitus with diabetic polyneuropathy, without long-term current use of insulin (HCC) - Semaglutide,0.25 or 0.5MG /DOS, 2 MG/3ML SOPN; Inject 0.25 mg into the skin once a week.  Dispense: 3 mL; Refill: 1  5. Constipation, unspecified constipation type   6. Diabetic polyneuropathy associated with type 2 diabetes mellitus (HCC)  7. Gastroesophageal reflux disease, unspecified whether esophagitis present  8. Hyperlipidemia, unspecified hyperlipidemia type  9. Morbid obesity (HCC)  10. Primary osteoarthritis involving multiple joints  11. Vitamin D deficiency    Start Ozempic 0.25 mg weekly Low carb diet Dexa scan today Continue current medications  Health Maintenance reviewed Diet and exercise encouraged  Follow up plan: 2 months to follow up on Ozempic   [name], FNP

## 2023-05-05 NOTE — Patient Instructions (Signed)

## 2023-05-09 ENCOUNTER — Other Ambulatory Visit: Payer: Self-pay | Admitting: Family

## 2023-05-09 DIAGNOSIS — M8580 Other specified disorders of bone density and structure, unspecified site: Secondary | ICD-10-CM | POA: Insufficient documentation

## 2023-05-22 ENCOUNTER — Ambulatory Visit: Payer: Medicare HMO | Admitting: Family

## 2023-05-22 ENCOUNTER — Encounter: Payer: Self-pay | Admitting: Family

## 2023-05-22 VITALS — BP 145/83 | HR 73 | Temp 97.2°F | Ht 66.0 in | Wt 222.0 lb

## 2023-05-22 DIAGNOSIS — E1142 Type 2 diabetes mellitus with diabetic polyneuropathy: Secondary | ICD-10-CM

## 2023-05-22 DIAGNOSIS — K5900 Constipation, unspecified: Secondary | ICD-10-CM

## 2023-05-22 DIAGNOSIS — M25562 Pain in left knee: Secondary | ICD-10-CM

## 2023-05-22 DIAGNOSIS — Z7985 Long-term (current) use of injectable non-insulin antidiabetic drugs: Secondary | ICD-10-CM | POA: Diagnosis not present

## 2023-05-22 MED ORDER — DICLOFENAC SODIUM 75 MG PO TBEC
75.0000 mg | DELAYED_RELEASE_TABLET | Freq: Two times a day (BID) | ORAL | 0 refills | Status: DC
Start: 2023-05-22 — End: 2023-10-16

## 2023-05-22 NOTE — Patient Instructions (Signed)
Lateral Collateral Knee Ligament Sprain  The lateral collateral ligament (LCL) is a tough band of tissue that connects the thigh bone to the smaller of the lower leg bones. It is located on the outer side of the knee and helps keep the knee stable. An LCL sprain is a stretch or tear in the LCL. What are the causes? This condition may be caused by: A hard, direct hit to the inner side of your knee. Running and changing directions quickly (cutting). Twisting your knee forcefully. What increases the risk? The following factors may make you more likely to develop this condition: Playing contact sports that involve cutting, such as football or soccer. Participating in sports in which there is a risk of twisting the knee, like skiing or wrestling. Having weak muscles. Having poor coordination. What are the signs or symptoms? Symptoms of this condition include: Pain on the outside of the knee. A popping sound at the time of injury. Swelling along the outside of the knee. Bruising around the knee. Feeling unstable when you stand, like your knee will give way. Difficulty walking on uneven surfaces. Numbness at the knee. How is this diagnosed? This condition may be diagnosed based on: Your medical history. A physical exam. Your health care provider will feel the side of your knee and move it to check for stability. Imaging tests, such as an X-ray or MRI. How is this treated? This condition may be treated by: Resting the knee by keeping weight off of it until swelling and pain improve. Raising (elevating) the knee above the level of your heart. This helps to reduce swelling. Icing the knee. This helps to reduce swelling. Taking an NSAID, like ibuprofen. This helps to reduce pain and swelling. Using a knee brace and crutches while the injury heals. Using a knee brace when participating in athletic activities. Doing rehab exercises (physical therapy). Surgery. This may be needed if: Your LCL  tore all the way through. Your knee is unstable. Your knee is not getting better with other treatments. Follow these instructions at home: If you have a removable brace: Wear the brace as told by your health care provider. Remove it only as told by your health care provider. Check the skin around the brace every day. Tell your health care provider about any concerns. Loosen the brace if your toes tingle, become numb, or turn cold and blue. Keep the brace clean. If the brace is not waterproof: Do not let it get wet. Cover it with a watertight covering when you take a bath or shower. Managing pain, stiffness, and swelling  If directed, put ice on the outer side of your knee. To do this: If you have a removable brace, remove it as told by your health care provider. Put ice in a plastic bag. Place a towel between your skin and the bag. Leave the ice on for 20 minutes, 2-3 times a day. Remove the ice if your skin turns bright red. This is very important. If you cannot feel pain, heat, or cold, you have a greater risk of damage to the area. Move your foot and toes often to reduce stiffness and swelling. Raise (elevate) your knee above the level of your heart while you are sitting or lying down. Medicines Take over-the-counter and prescription medicines only as told by your health care provider. Ask your health care provider if the medicine prescribed to you: Requires you to avoid driving or using machinery. Can cause constipation. You may need to take these  actions to prevent or treat constipation: Drink enough fluid to keep your urine pale yellow. Take over-the-counter or prescription medicines. Eat foods that are high in fiber, such as beans, whole grains, and fresh fruits and vegetables. Limit foods that are high in fat and processed sugars, such as fried or sweet foods. Activity Ask your health care provider when it is safe to drive if you have a brace on your leg. Do exercises as told  by your health care provider. Do not use the injured leg to support your body weight until your health care provider says that you can. Use crutches and a brace as told by your health care provider. Return to your normal activities as told by your health care provider. Ask your health care provider what activities are safe for you. General instructions Do not use any products that contain nicotine or tobacco. These products include cigarettes, chewing tobacco, and vaping devices, such as e-cigarettes. If you need help quitting, ask your health care provider. Keep all follow-up visits. This is important. How is this prevented? Warm up and stretch before being active. Cool down and stretch after being active. Give your body time to rest between periods of activity. Make sure to use equipment that fits you. Be safe and responsible while being active. This will help you avoid falls. Do at least 150 minutes of moderate-intensity exercise each week, such as brisk walking or water aerobics. Maintain physical fitness, including: Strength. Flexibility. Cardiovascular fitness. Endurance. Contact a health care provider if: You continue to have pain and swelling for 2-4 weeks. Your symptoms get worse. Your knee feels unstable or gives way. Summary The lateral collateral ligament (LCL) is a tough band of tissue that connects the thigh bone to the smaller of the lower leg bones. An LCL sprain is a stretch or tear in the LCL. This condition may be treated in several ways, including resting your knee, wearing a brace, taking medicines, and having surgery. Do not use the injured leg to support your body weight until your health care provider says that you can. Use crutches and a brace as told by your health care provider. Contact a health care provider if pain and swelling do not improve in 2-4 weeks or your symptoms get worse. This information is not intended to replace advice given to you by your health  care provider. Make sure you discuss any questions you have with your health care provider. Document Revised: 03/19/2021 Document Reviewed: 03/19/2021 Elsevier Patient Education  2024 [employer].

## 2023-05-22 NOTE — Progress Notes (Addendum)
Subjective:    Patient ID: Cynthia Leon, female    DOB: 1950/12/06, 72 y.o.   MRN: [id number]  Chief Complaint  Patient presents with   Follow-up   Knee Pain    LEFT KNEE E PAIN   PT presents to the office today to follow up on DM. She was seen on 05/05/23 and started on Ozempic 0.25 mg. She reports mild constipation, but no other problems.      05/22/2023    2:58 PM 05/05/2023    9:31 AM 02/03/2023   10:51 AM  Last 3 Weights  Weight (lbs) 222 lb 222 lb 3.2 oz 220 lb  Weight (kg) 100.699 kg 100.789 kg 99.791 kg     Knee Pain  The incident occurred 12 to 24 hours ago. The injury mechanism was a twisting injury. The pain is present in the left knee. The quality of the pain is described as aching. The pain is at a severity of 8/10. Pertinent negatives include no loss of motion, loss of sensation, numbness or tingling. She reports no foreign bodies present. The symptoms are aggravated by movement. She has tried rest (muscle relaxer) for the symptoms. The treatment provided mild relief.  Diabetes She presents for her follow-up diabetic visit. She has type 2 diabetes mellitus. Pertinent negatives for diabetes include no blurred vision and no foot paresthesias. Risk factors for coronary artery disease include dyslipidemia, diabetes mellitus, hypertension, sedentary lifestyle and post-menopausal. (Does not check glucose at home)      Review of Systems  Eyes:  Negative for blurred vision.  Neurological:  Negative for tingling and numbness.  All other systems reviewed and are negative.      Objective:   Physical Exam Vitals reviewed.  Constitutional:      General: She is not in acute distress.    Appearance: She is well-developed. She is obese.  HENT:     Head: Normocephalic and atraumatic.     Right Ear: Tympanic membrane normal.     Left Ear: Tympanic membrane normal.  Eyes:     Pupils: Pupils are equal, round, and reactive to light.  Neck:     Thyroid: No  thyromegaly.  Cardiovascular:     Rate and Rhythm: Normal rate and regular rhythm.     Heart sounds: Normal heart sounds. No murmur heard. Pulmonary:     Effort: Pulmonary effort is normal. No respiratory distress.     Breath sounds: Normal breath sounds. No wheezing.  Abdominal:     General: Bowel sounds are normal. There is no distension.     Palpations: Abdomen is soft.     Tenderness: There is no abdominal tenderness.  Musculoskeletal:        General: Tenderness (pain in medial knee with flexion and extension) present. Normal range of motion.     Cervical back: Normal range of motion and neck supple.  Skin:    General: Skin is warm and dry.  Neurological:     Mental Status: She is alert and oriented to person, place, and time.     Cranial Nerves: No cranial nerve deficit.     Deep Tendon Reflexes: Reflexes are normal and symmetric.  Psychiatric:        Behavior: Behavior normal.        Thought Content: Thought content normal.        Judgment: Judgment normal.     BP (!) 145/83   Pulse 73   Temp (!) 97.2 F (36.2 C) (Temporal)  Ht 5\' 6"  (1.676 m)   Wt 222 lb (100.7 kg)   SpO2 95%   BMI 35.83 kg/m        Assessment & Plan:  Cynthia Leon comes in today with chief complaint of Follow-up and Knee Pain (LEFT KNEE E PAIN)   Diagnosis and orders addressed:  1. Type 2 diabetes mellitus with diabetic polyneuropathy, without long-term current use of insulin (HCC) Continue Ozempic 0.25 mg  Low carb diet   2. Morbid obesity (HCC) Encourage healthy diet and exercise   3. Acute pain of left knee Rest Ice Start diclofenac BID with food - diclofenac (VOLTAREN) 75 MG EC tablet; Take 1 tablet (75 mg total) by mouth 2 (two) times daily.  Dispense: 14 tablet; Refill: 0    4. Constipation, unspecified constipation type Start stool softener    Labs pending Health Maintenance reviewed Diet and exercise encouraged  Follow up plan: Keep chronic follow  up  [name], FNP

## 2023-06-05 ENCOUNTER — Encounter: Payer: Self-pay | Admitting: Internal Medicine

## 2023-06-05 ENCOUNTER — Ambulatory Visit: Payer: Medicare HMO | Attending: Internal Medicine | Admitting: Internal Medicine

## 2023-06-05 VITALS — BP 134/68 | HR 89 | Ht 64.0 in | Wt 219.6 lb

## 2023-06-05 DIAGNOSIS — R0609 Other forms of dyspnea: Secondary | ICD-10-CM | POA: Diagnosis not present

## 2023-06-05 DIAGNOSIS — Q2111 Secundum atrial septal defect: Secondary | ICD-10-CM | POA: Diagnosis not present

## 2023-06-05 NOTE — Progress Notes (Signed)
Cardiology Office Note  Date: 06/05/2023   ID: Cynthia Leon, DOB 16-Jun-1951, MRN [id number]  PCP:  Junie Spencer, FNP  Cardiologist:  Marjo Bicker, MD Electrophysiologist:  None     History of Present Illness: Cynthia Leon is a 72 y.o. female known to have DM 2, HLD is here for follow-up visit.  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  Echocardiogram was obtained that showed normal LVEF, normal RV size and function, G1 DD with normal LVEDP. Due to unclear exertion DD, she was started on p.o. Lasix 20 mg once daily which seemed to help her symptoms.  Denies having any angina, orthopnea, PND, has stable SOB symptoms with exertion like mowing the lawn, denies dizziness, syncope or leg swelling.   Past Medical History:  Diagnosis Date   Abdominal pain    Allergic rhinitis    Anxiety    Constipation    Fibromyalgia    GERD (gastroesophageal reflux disease)    Head ache    Hyperlipidemia    Osteopenia    PUD (peptic ulcer disease)    Right hip pain    Shingles     Past Surgical History:  Procedure Laterality Date   COLONOSCOPY N/A 12/13/2015   Procedure: COLONOSCOPY;  Surgeon: Malissa Hippo, MD;  Location: [facility];  Service: Endoscopy;  Laterality: N/A;  1030   TOTAL HIP ARTHROPLASTY Right     Current Outpatient Medications  Medication Sig Dispense Refill   ALPRAZolam (XANAX) 0.5 MG tablet Take 1 tablet (0.5 mg total) by mouth at bedtime as needed for anxiety. 30 tablet 2   Calcium & Magnesium Carbonates (MYLANTA PO) Take by mouth as needed.     cyclobenzaprine (FLEXERIL) 10 MG tablet TAKE 1 TABLET BY MOUTH THREE TIMES A DAY AS NEEDED FOR MUSCLE SPASM 90 tablet 2   diclofenac (VOLTAREN) 75 MG EC tablet Take 1 tablet (75 mg total) by mouth 2 (two) times daily. 14 tablet 0   DULoxetine (CYMBALTA) 60 MG  capsule TAKE 1 CAPSULE BY MOUTH EVERY DAY 90 capsule 0   famotidine (PEPCID) 20 MG tablet One after supper 30 tablet 11   furosemide (LASIX) 20 MG tablet Take 1 tablet (20 mg total) by mouth daily. 90 tablet 3   pantoprazole (PROTONIX) 40 MG tablet TAKE 1 TABLET (40 MG TOTAL) BY MOUTH DAILY. TAKE 30-60 MIN BEFORE FIRST MEAL OF THE DAY 90 tablet 1   rosuvastatin (CRESTOR) 20 MG tablet TAKE 1 TABLET BY MOUTH EVERY DAY 90 tablet 0   Semaglutide,0.25 or 0.5MG /DOS, 2 MG/3ML SOPN Inject 0.25 mg into the skin once a week. 3 mL 1   topiramate (TOPAMAX) 50 MG tablet TAKE 1 TABLET BY MOUTH TWICE A DAY 180 tablet 0   No current facility-administered medications for this visit.   Allergies:  Mobic [meloxicam], Asa [aspirin], and Gabapentin   Social History: The patient  reports that she has never smoked. She has been exposed to tobacco smoke. She has never used smokeless tobacco. She reports that she does not drink alcohol and does not use drugs.   Family History: The patient's family history includes Breast cancer in her mother; COPD in her father; Cancer in her brother, brother, brother, brother, mother, and sister; Coronary artery disease in her mother and sister; Diabetes in her sister and sister; Emphysema in her  father; Esophageal cancer in her sister; Healthy in her daughter, daughter, daughter, and daughter; Heart disease in her father; Hypertension in her father; Lupus in her brother.   ROS:  Please see the history of present illness. Otherwise, complete review of systems is positive for none  All other systems are reviewed and negative.   Physical Exam: VS:  BP 134/68 (BP Location: Left Arm, Patient Position: Sitting, Cuff Size: Normal)   Pulse 89   Ht 5\' 4"  (1.626 m)   Wt 219 lb 9.6 oz (99.6 kg)   SpO2 97%   BMI 37.69 kg/m , BMI Body mass index is 37.69 kg/m.  Wt Readings from Last 3 Encounters:  06/05/23 219 lb 9.6 oz (99.6 kg)  05/22/23 222 lb (100.7 kg)  05/05/23 222 lb 3.2 oz (100.8  kg)    General: Patient appears comfortable at rest. HEENT: Conjunctiva and lids normal, oropharynx clear with moist mucosa. Neck: Supple, no elevated JVP or carotid bruits, no thyromegaly. Lungs: Clear to auscultation, nonlabored breathing at rest. Cardiac: Regular rate and rhythm, no S3 or significant systolic murmur, no pericardial rub. Abdomen: Soft, nontender, no hepatomegaly, bowel sounds present, no guarding or rebound. Extremities: No pitting edema, distal pulses 2+. Skin: Warm and dry. Musculoskeletal: No kyphosis. Neuropsychiatric: Alert and oriented x3, affect grossly appropriate.  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  Lasix helping her. Unclear if she has any exertional DD. Will continue p.o. Lasix 20 mg once daily, serum creatinine stable.  Echocardiogram from 12/2022 showed normal LVEF, G1 DD with normal LVEDP, normal RV size and function and no valvular heart disease. 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 Although she has stable DOE symptoms with lawn mowing, she does not have any orthopnea or PND, the symptoms could very well be secondary to morbid obesity, currently on semaglutide, hopefully her SOB will improve with weight loss.  When they do, she will need to come off Lasix.  Otherwise, continue  rosuvastatin 20 mg nightly, goal LDL less than 100.    I spent a total duration 30 minutes reviewing the prior notes, EKG, labs, medications, face-to-face discussion/counseling of her medical condition, pathophysiology, evaluation, management, ordering test and documenting the findings in the note.   Disposition:  Follow up  1 year  Signed Jonuel Butterfield Verne Spurr, MD, 06/05/2023 9:45 AM    [facility] [address] [place], [place], [place] [address]

## 2023-06-05 NOTE — Patient Instructions (Signed)

## 2023-06-10 ENCOUNTER — Other Ambulatory Visit: Payer: Self-pay | Admitting: Family

## 2023-06-10 DIAGNOSIS — E785 Hyperlipidemia, unspecified: Secondary | ICD-10-CM

## 2023-06-10 DIAGNOSIS — F411 Generalized anxiety disorder: Secondary | ICD-10-CM

## 2023-06-10 DIAGNOSIS — M797 Fibromyalgia: Secondary | ICD-10-CM

## 2023-07-24 ENCOUNTER — Telehealth (INDEPENDENT_AMBULATORY_CARE_PROVIDER_SITE_OTHER): Payer: Medicare HMO | Admitting: Family

## 2023-07-24 ENCOUNTER — Encounter: Payer: Self-pay | Admitting: Family

## 2023-07-24 DIAGNOSIS — E1142 Type 2 diabetes mellitus with diabetic polyneuropathy: Secondary | ICD-10-CM | POA: Diagnosis not present

## 2023-07-24 DIAGNOSIS — F411 Generalized anxiety disorder: Secondary | ICD-10-CM | POA: Diagnosis not present

## 2023-07-24 DIAGNOSIS — M150 Primary generalized (osteo)arthritis: Secondary | ICD-10-CM | POA: Diagnosis not present

## 2023-07-24 DIAGNOSIS — E785 Hyperlipidemia, unspecified: Secondary | ICD-10-CM

## 2023-07-24 DIAGNOSIS — Z7985 Long-term (current) use of injectable non-insulin antidiabetic drugs: Secondary | ICD-10-CM | POA: Diagnosis not present

## 2023-07-24 DIAGNOSIS — G43009 Migraine without aura, not intractable, without status migrainosus: Secondary | ICD-10-CM

## 2023-07-24 DIAGNOSIS — Z79899 Other long term (current) drug therapy: Secondary | ICD-10-CM | POA: Diagnosis not present

## 2023-07-24 DIAGNOSIS — K219 Gastro-esophageal reflux disease without esophagitis: Secondary | ICD-10-CM | POA: Diagnosis not present

## 2023-07-24 DIAGNOSIS — F1320 Sedative, hypnotic or anxiolytic dependence, uncomplicated: Secondary | ICD-10-CM

## 2023-07-24 MED ORDER — ALPRAZOLAM 0.5 MG PO TABS
0.5000 mg | ORAL_TABLET | Freq: Every evening | ORAL | 2 refills | Status: DC | PRN
Start: 2023-07-24 — End: 2023-10-16

## 2023-07-24 MED ORDER — SEMAGLUTIDE (1 MG/DOSE) 4 MG/3ML ~~LOC~~ SOPN
1.0000 mg | PEN_INJECTOR | SUBCUTANEOUS | 2 refills | Status: DC
Start: 2023-07-24 — End: 2023-10-16

## 2023-07-24 NOTE — Progress Notes (Signed)
Virtual Visit Consent   JENNNIFER HINEBAUGH, you are scheduled for a virtual visit with a [facility] provider today. Just as with appointments in the office, your consent must be obtained to participate. Your consent will be active for this visit and any virtual visit you may have with one of our providers in the next 365 days. If you have a MyChart account, a copy of this consent can be sent to you electronically.  As this is a virtual visit, video technology does not allow for your provider to perform a traditional examination. This may limit your provider's ability to fully assess your condition. If your provider identifies any concerns that need to be evaluated in person or the need to arrange testing (such as labs, EKG, etc.), we will make arrangements to do so. Although advances in technology are sophisticated, we cannot ensure that it will always work on either your end or our end. If the connection with a video visit is poor, the visit may have to be switched to a telephone visit. With either a video or telephone visit, we are not always able to ensure that we have a secure connection.  By engaging in this virtual visit, you consent to the provision of healthcare and authorize for your insurance to be billed (if applicable) for the services provided during this visit. Depending on your insurance coverage, you may receive a charge related to this service.  I need to obtain your verbal consent now. Are you willing to proceed with your visit today? Cynthia Leon has provided verbal consent on 07/24/2023 for a virtual visit (video or telephone). [name], FNP  Date: 07/24/2023 1:18 PM  Virtual Visit via Video Note   I, [name], connected with  Cynthia Leon  ([id number], 1951-06-17) on 07/24/23 at 10:40 AM EST by a video-enabled telemedicine application and verified that I am speaking with the correct person using two identifiers.  Location: Patient: Virtual Visit  Location Patient: Home Provider: Virtual Visit Location Provider: Home Office   I discussed the limitations of evaluation and management by telemedicine and the availability of in person appointments. The patient expressed understanding and agreed to proceed.    History of Present Illness: Cynthia Leon is a 72 y.o. who identifies as a female who was assigned female at birth, and is being seen today for chronic follow up. Pt is morbid obese today with a BMI of 35 and co-moribility of DM and GERD.  Marland Kitchen  HPI: Gastroesophageal Reflux She complains of belching and heartburn. This is a chronic problem. The current episode started more than 1 year ago. Risk factors include obesity. She has tried a PPI for the symptoms. The treatment provided moderate relief.  Diabetes She presents for her follow-up diabetic visit. She has type 2 diabetes mellitus. Hypoglycemia symptoms include nervousness/anxiousness. Pertinent negatives for diabetes include no blurred vision and no foot paresthesias. Risk factors for coronary artery disease include dyslipidemia, diabetes mellitus, hypertension, stress, post-menopausal and sedentary lifestyle. She is following a generally healthy diet. (Does not check glucose at home) Eye exam is current.  Arthritis Presents for follow-up visit. She complains of pain and stiffness. Affected locations include the right knee, left knee, left MCP and right MCP. Her pain is at a severity of 8/10.  Anxiety Presents for follow-up visit. Symptoms include excessive worry, irritability, nervous/anxious behavior and restlessness. Symptoms occur occasionally. The severity of symptoms is moderate.    Hyperlipidemia This is a chronic problem. The current episode  started more than 1 year ago. The problem is controlled. Exacerbating diseases include obesity. Current antihyperlipidemic treatment includes statins. The current treatment provides moderate improvement of lipids. Risk factors for  coronary artery disease include diabetes mellitus, dyslipidemia, hypertension and a sedentary lifestyle.  Constipation This is a chronic problem. The current episode started more than 1 year ago.    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   Osteopenia 05/09/2023   Non-cardiac chest pain 12/03/2022   DOE (dyspnea on exertion) 12/03/2022   Diabetic polyneuropathy associated with type 2 diabetes mellitus (HCC) 03/22/2022   Controlled substance agreement signed 09/29/2018   Benzodiazepine dependence (HCC) 09/29/2018   Osteoarthritis 10/03/2017   Diabetes mellitus (HCC) 08/26/2016   Constipation 08/20/2016   Morbid obesity (HCC) 09/08/2015   Metabolic syndrome 09/08/2015   Migraine 03/06/2015   GAD (generalized anxiety disorder) 05/02/2014   Vitamin D deficiency 05/02/2014   Fibromyalgia 02/17/2013   Hyperlipidemia 02/17/2013   GERD (gastroesophageal reflux disease) 02/17/2013    Allergies:  Allergies  Allergen Reactions   Mobic [Meloxicam] Nausea And Vomiting   Asa [Aspirin] Nausea And Vomiting   Gabapentin Anxiety    Restless, couldn't sleep   Medications:  Current Outpatient Medications:    Semaglutide, 1 MG/DOSE, 4 MG/3ML SOPN, Inject 1 mg into the skin once a week., Disp: 3 mL, Rfl: 2   ALPRAZolam (XANAX) 0.5 MG tablet, Take 1 tablet (0.5 mg total) by mouth at bedtime as needed for anxiety., Disp: 30 tablet, Rfl: 2   Calcium & Magnesium Carbonates (MYLANTA PO), Take by mouth as needed., Disp: , Rfl:    cyclobenzaprine (FLEXERIL) 10 MG tablet, TAKE 1 TABLET BY MOUTH THREE TIMES A DAY AS NEEDED FOR MUSCLE SPASM, Disp: 90 tablet, Rfl: 2   diclofenac (VOLTAREN) 75 MG EC tablet, Take 1 tablet (75 mg total) by mouth 2 (two) times daily., Disp: 14 tablet, Rfl: 0   DULoxetine (CYMBALTA) 60 MG capsule, TAKE 1 CAPSULE BY MOUTH EVERY DAY, Disp: 90 capsule, Rfl: 0   famotidine (PEPCID) 20 MG tablet, One after supper, Disp: 30 tablet, Rfl:  11   furosemide (LASIX) 20 MG tablet, Take 1 tablet (20 mg total) by mouth daily., Disp: 90 tablet, Rfl: 3   pantoprazole (PROTONIX) 40 MG tablet, TAKE 1 TABLET (40 MG TOTAL) BY MOUTH DAILY. TAKE 30-60 MIN BEFORE FIRST MEAL OF THE DAY, Disp: 90 tablet, Rfl: 1   rosuvastatin (CRESTOR) 20 MG tablet, TAKE 1 TABLET BY MOUTH EVERY DAY, Disp: 90 tablet, Rfl: 0   topiramate (TOPAMAX) 50 MG tablet, TAKE 1 TABLET BY MOUTH TWICE A DAY, Disp: 180 tablet, Rfl: 0  Observations/Objective: Patient is well-developed, well-nourished in no acute distress.  Resting comfortably  at home.  Head is normocephalic, atraumatic.  No labored breathing.  Speech is clear and coherent with logical content.  Patient is alert and oriented at baseline.    Assessment and Plan: 1. Type 2 diabetes mellitus with diabetic polyneuropathy, without long-term current use of insulin (HCC) (Primary) - Semaglutide, 1 MG/DOSE, 4 MG/3ML SOPN; Inject 1 mg into the skin once a week.  Dispense: 3 mL; Refill: 2  2. Migraine without aura and without status migrainosus, not intractable  3. Hyperlipidemia, unspecified hyperlipidemia type  4. Gastroesophageal reflux disease, unspecified whether esophagitis present  5. GAD (generalized anxiety disorder) - ALPRAZolam (XANAX) 0.5 MG tablet; Take 1 tablet (0.5 mg total) by mouth at bedtime as needed for anxiety.  Dispense: 30  tablet; Refill: 2  6. Primary osteoarthritis involving multiple joints  7. Controlled substance agreement signed - ALPRAZolam (XANAX) 0.5 MG tablet; Take 1 tablet (0.5 mg total) by mouth at bedtime as needed for anxiety.  Dispense: 30 tablet; Refill: 2  8. Benzodiazepine dependence (HCC) - ALPRAZolam (XANAX) 0.5 MG tablet; Take 1 tablet (0.5 mg total) by mouth at bedtime as needed for anxiety.  Dispense: 30 tablet; Refill: 2  9. Diabetic polyneuropathy associated with type 2 diabetes mellitus (HCC)  Will increase Ozempic to 1 mg from 0.5 mg Continue stool  softeners as needed as we increase Ozempic  Low carb diet  Continue current medications  Patient reviewed in [place] controlled database, no flags noted. Contract and drug screen are up to date.  Follow up in 3 months   Follow Up Instructions: I discussed the assessment and treatment plan with the patient. The patient was provided an opportunity to ask questions and all were answered. The patient agreed with the plan and demonstrated an understanding of the instructions.  A copy of instructions were sent to the patient via MyChart unless otherwise noted below.     The patient was advised to call back or seek an in-person evaluation if the symptoms worsen or if the condition fails to improve as anticipated.    [name], FNP

## 2023-07-24 NOTE — Patient Instructions (Signed)
Health Maintenance After Age 73 After age 73, you are at a higher risk for certain long-term diseases and infections as well as injuries from falls. Falls are a major cause of broken bones and head injuries in people who are older than age 73. Getting regular preventive care can help to keep you healthy and well. Preventive care includes getting regular testing and making lifestyle changes as recommended by your health care provider. Talk with your health care provider about: Which screenings and tests you should have. A screening is a test that checks for a disease when you have no symptoms. A diet and exercise plan that is right for you. What should I know about screenings and tests to prevent falls? Screening and testing are the best ways to find a health problem early. Early diagnosis and treatment give you the best chance of managing medical conditions that are common after age 73. Certain conditions and lifestyle choices may make you more likely to have a fall. Your health care provider may recommend: Regular vision checks. Poor vision and conditions such as cataracts can make you more likely to have a fall. If you wear glasses, make sure to get your prescription updated if your vision changes. Medicine review. Work with your health care provider to regularly review all of the medicines you are taking, including over-the-counter medicines. Ask your health care provider about any side effects that may make you more likely to have a fall. Tell your health care provider if any medicines that you take make you feel dizzy or sleepy. Strength and balance checks. Your health care provider may recommend certain tests to check your strength and balance while standing, walking, or changing positions. Foot health exam. Foot pain and numbness, as well as not wearing proper footwear, can make you more likely to have a fall. Screenings, including: Osteoporosis screening. Osteoporosis is a condition that causes  the bones to get weaker and break more easily. Blood pressure screening. Blood pressure changes and medicines to control blood pressure can make you feel dizzy. Depression screening. You may be more likely to have a fall if you have a fear of falling, feel depressed, or feel unable to do activities that you used to do. Alcohol use screening. Using too much alcohol can affect your balance and may make you more likely to have a fall. Follow these instructions at home: Lifestyle Do not drink alcohol if: Your health care provider tells you not to drink. If you drink alcohol: Limit how much you have to: 0-1 drink a day for women. 0-2 drinks a day for men. Know how much alcohol is in your drink. In the [place] one drink equals one 12 oz bottle of beer (355 mL), one 5 oz glass of wine (148 mL), or one 1 oz glass of hard liquor (44 mL). Do not use any products that contain nicotine or tobacco. These products include cigarettes, chewing tobacco, and vaping devices, such as e-cigarettes. If you need help quitting, ask your health care provider. Activity  Follow a regular exercise program to stay fit. This will help you maintain your balance. Ask your health care provider what types of exercise are appropriate for you. If you need a cane or walker, use it as recommended by your health care provider. Wear supportive shoes that have nonskid soles. Safety  Remove any tripping hazards, such as rugs, cords, and clutter. Install safety equipment such as grab bars in bathrooms and safety rails on stairs. Keep rooms and walkways   well-lit. General instructions Talk with your health care provider about your risks for falling. Tell your health care provider if: You fall. Be sure to tell your health care provider about all falls, even ones that seem minor. You feel dizzy, tiredness (fatigue), or off-balance. Take over-the-counter and prescription medicines only as told by your health care provider. These include  supplements. Eat a healthy diet and maintain a healthy weight. A healthy diet includes low-fat dairy products, low-fat (lean) meats, and fiber from whole grains, beans, and lots of fruits and vegetables. Stay current with your vaccines. Schedule regular health, dental, and eye exams. Summary Having a healthy lifestyle and getting preventive care can help to protect your health and wellness after age 73. Screening and testing are the best way to find a health problem early and help you avoid having a fall. Early diagnosis and treatment give you the best chance for managing medical conditions that are more common for people who are older than age 73. Falls are a major cause of broken bones and head injuries in people who are older than age 73. Take precautions to prevent a fall at home. Work with your health care provider to learn what changes you can make to improve your health and wellness and to prevent falls. This information is not intended to replace advice given to you by your health care provider. Make sure you discuss any questions you have with your health care provider. Document Revised: 12/04/2020 Document Reviewed: 12/04/2020 Elsevier Patient Education  2024 [employer] Inc[employer]  

## 2023-08-07 ENCOUNTER — Other Ambulatory Visit: Payer: Self-pay | Admitting: Internal Medicine

## 2023-08-07 NOTE — Telephone Encounter (Signed)
 Pt last seen 01-28-23. Lov did not state information to continue this. Pt requesting refills. Please advise.

## 2023-08-11 ENCOUNTER — Other Ambulatory Visit: Payer: Self-pay | Admitting: Family

## 2023-08-11 DIAGNOSIS — M797 Fibromyalgia: Secondary | ICD-10-CM

## 2023-08-15 ENCOUNTER — Other Ambulatory Visit: Payer: Self-pay | Admitting: Family

## 2023-09-09 ENCOUNTER — Ambulatory Visit: Payer: Self-pay | Admitting: Family

## 2023-09-09 NOTE — Telephone Encounter (Signed)
Chief Complaint: R arm pain Symptoms: R arm pain and underarm swelling Frequency: Month or more Pertinent Negatives: Patient denies fever, redness, drainage, signs of infection Disposition: [] ED /[] Urgent Care (no appt availability in office) / [x] Appointment(In office/virtual)/ []  [facility]/ [] Home Care/ [] Refused Recommended Disposition /[]  Mobile Bus/ []  Follow-up with PCP Additional Notes: Pt reports a month or more of R arm pain with swelling to the underarm. 7/10 pain. States husband has to help her put on clothing sometimes. Denies fever, redness, color change, numbness, tingling, drainage, signs of infection. Does endorse swelling in the underarm, states there is certainly a localized area of swelling "but its hard to explain." No CP, no SOB. Pt states there is a hx of cancer in her family. Per protocol, pt scheduled for Thursday (requested Thursday) in the office at 1030, pt agreeable. RN advised pt please call back for worsening, pt verbalized understanding.   Copied from CRM [id number]. Topic: Clinical - Red Word Triage >> Sep 09, 2023 10:19 AM Antony Haste wrote: Red Word that prompted transfer to Nurse Triage: right arm - soreness, tenderness, and extreme pain. Having a hard time putting coat/sweater on due to discomfort. Reason for Disposition  [1] MODERATE pain (e.g., interferes with normal activities) AND [2] present > 3 days  Answer Assessment - Initial Assessment Questions 1. ONSET: "When did the pain start?"     "Been sore about a month or so", pt endorses area of swelling in the underarm for longer than that 2. LOCATION: "Where is the pain located?"     Right arm, pain is worse in the underarm area. From shoulder down to arm. 3. PAIN: "How bad is the pain?" (Scale 1-10; or mild, moderate, severe)   - MILD (1-3): Doesn't interfere with normal activities.   - MODERATE (4-7): Interferes with normal activities (e.g., work or school) or awakens from sleep.   -  SEVERE (8-10): Excruciating pain, unable to do any normal activities, unable to hold a cup of water.     7-8/10 - can't sleep on that arm, husband has to help patient put on clothing 4. WORK OR EXERCISE: "Has there been any recent work or exercise that involved this part of the body?"     None 5. CAUSE: "What do you think is causing the arm pain?"     States she has cancer in her family but doesn't want to panic yet 6. OTHER SYMPTOMS: "Do you have any other symptoms?" (e.g., neck pain, swelling, rash, fever, numbness, weakness)     Underarm swelling, pt states there is a specific area of swelling "but I wouldn't call it a knot." No fever. No drainage or pus. No skin redness or signs of infection. No numbness or tingling.No CP or SOB. Taking ibuprofen and Flexeril  Protocols used: Arm Pain-A-AH

## 2023-09-10 NOTE — Progress Notes (Unsigned)
   Acute Office Visit  Subjective:     Patient ID: Cynthia Leon, female    DOB: 12-09-1950, 73 y.o.   MRN: [id number]  No chief complaint on file.   HPI  ROS Negative unless indicated in HPI    Objective:    There were no vitals taken for this visit. {Vitals History (Optional):23777}  Physical Exam  No results found for any visits on 09/11/23.      Assessment & Plan:  There are no diagnoses linked to this encounter. Continue healthy lifestyle choices, including diet (rich in fruits, vegetables, and lean proteins, and low in salt and simple carbohydrates) and exercise (at least 30 minutes of moderate physical activity daily).     The above assessment and management plan was discussed with the patient. The patient verbalized understanding of and has agreed to the management plan. Patient is aware to call the clinic if they develop any new symptoms or if symptoms persist or worsen. Patient is aware when to return to the clinic for a follow-up visit. Patient educated on when it is appropriate to go to the emergency department.  No follow-ups on file.  Arrie Aran Santa Lighter, [facility] [address] [place], [place] [address] [phone]  Note: This document was prepared by [employer] voice dictation technology and any errors that results from this process are unintentional.

## 2023-09-11 ENCOUNTER — Ambulatory Visit (INDEPENDENT_AMBULATORY_CARE_PROVIDER_SITE_OTHER): Payer: Medicare HMO

## 2023-09-11 ENCOUNTER — Ambulatory Visit (INDEPENDENT_AMBULATORY_CARE_PROVIDER_SITE_OTHER): Payer: Medicare HMO | Admitting: Nurse Practitioner

## 2023-09-11 ENCOUNTER — Encounter: Payer: Self-pay | Admitting: Nurse Practitioner

## 2023-09-11 VITALS — BP 121/62 | HR 83 | Temp 98.3°F | Ht 64.0 in

## 2023-09-11 DIAGNOSIS — M9903 Segmental and somatic dysfunction of lumbar region: Secondary | ICD-10-CM | POA: Diagnosis not present

## 2023-09-11 DIAGNOSIS — M79601 Pain in right arm: Secondary | ICD-10-CM | POA: Insufficient documentation

## 2023-09-11 DIAGNOSIS — M79622 Pain in left upper arm: Secondary | ICD-10-CM | POA: Insufficient documentation

## 2023-09-11 DIAGNOSIS — M85821 Other specified disorders of bone density and structure, right upper arm: Secondary | ICD-10-CM | POA: Diagnosis not present

## 2023-09-11 DIAGNOSIS — M9905 Segmental and somatic dysfunction of pelvic region: Secondary | ICD-10-CM | POA: Diagnosis not present

## 2023-09-11 DIAGNOSIS — M79621 Pain in right upper arm: Secondary | ICD-10-CM | POA: Diagnosis not present

## 2023-09-11 DIAGNOSIS — M19011 Primary osteoarthritis, right shoulder: Secondary | ICD-10-CM | POA: Diagnosis not present

## 2023-09-11 DIAGNOSIS — M62830 Muscle spasm of back: Secondary | ICD-10-CM | POA: Diagnosis not present

## 2023-09-11 DIAGNOSIS — M9904 Segmental and somatic dysfunction of sacral region: Secondary | ICD-10-CM | POA: Diagnosis not present

## 2023-09-11 MED ORDER — METHYLPREDNISOLONE ACETATE 40 MG/ML IJ SUSP
40.0000 mg | Freq: Once | INTRAMUSCULAR | Status: AC
Start: 2023-09-11 — End: 2023-09-11
  Administered 2023-09-11: 40 mg via INTRAMUSCULAR

## 2023-09-16 ENCOUNTER — Other Ambulatory Visit: Payer: Self-pay | Admitting: Family

## 2023-09-16 DIAGNOSIS — M797 Fibromyalgia: Secondary | ICD-10-CM

## 2023-09-16 DIAGNOSIS — F411 Generalized anxiety disorder: Secondary | ICD-10-CM

## 2023-09-16 NOTE — Telephone Encounter (Signed)
Christy NTBS in March for 3 mos FU RF sent to pharmacy

## 2023-09-16 NOTE — Telephone Encounter (Signed)
Scheduled appt for 03/20

## 2023-09-22 ENCOUNTER — Other Ambulatory Visit: Payer: Self-pay | Admitting: Family

## 2023-09-22 ENCOUNTER — Other Ambulatory Visit: Payer: Self-pay | Admitting: Internal Medicine

## 2023-09-22 DIAGNOSIS — M797 Fibromyalgia: Secondary | ICD-10-CM

## 2023-09-24 DIAGNOSIS — M1990 Unspecified osteoarthritis, unspecified site: Secondary | ICD-10-CM | POA: Diagnosis not present

## 2023-09-24 DIAGNOSIS — I951 Orthostatic hypotension: Secondary | ICD-10-CM | POA: Diagnosis not present

## 2023-09-24 DIAGNOSIS — Z8249 Family history of ischemic heart disease and other diseases of the circulatory system: Secondary | ICD-10-CM | POA: Diagnosis not present

## 2023-09-24 DIAGNOSIS — E785 Hyperlipidemia, unspecified: Secondary | ICD-10-CM | POA: Diagnosis not present

## 2023-09-24 DIAGNOSIS — Z809 Family history of malignant neoplasm, unspecified: Secondary | ICD-10-CM | POA: Diagnosis not present

## 2023-09-24 DIAGNOSIS — Z823 Family history of stroke: Secondary | ICD-10-CM | POA: Diagnosis not present

## 2023-09-24 DIAGNOSIS — F325 Major depressive disorder, single episode, in full remission: Secondary | ICD-10-CM | POA: Diagnosis not present

## 2023-09-24 DIAGNOSIS — I739 Peripheral vascular disease, unspecified: Secondary | ICD-10-CM | POA: Diagnosis not present

## 2023-09-24 DIAGNOSIS — Z008 Encounter for other general examination: Secondary | ICD-10-CM | POA: Diagnosis not present

## 2023-09-24 DIAGNOSIS — F411 Generalized anxiety disorder: Secondary | ICD-10-CM | POA: Diagnosis not present

## 2023-09-24 DIAGNOSIS — K219 Gastro-esophageal reflux disease without esophagitis: Secondary | ICD-10-CM | POA: Diagnosis not present

## 2023-09-24 DIAGNOSIS — Z833 Family history of diabetes mellitus: Secondary | ICD-10-CM | POA: Diagnosis not present

## 2023-09-24 LAB — LAB REPORT - SCANNED
A1c: 5.8
EGFR: 60
Microalb Creat Ratio: 33

## 2023-09-25 ENCOUNTER — Other Ambulatory Visit: Payer: Self-pay | Admitting: Family

## 2023-09-25 DIAGNOSIS — E785 Hyperlipidemia, unspecified: Secondary | ICD-10-CM

## 2023-09-29 ENCOUNTER — Telehealth: Payer: Self-pay | Admitting: Family Medicine

## 2023-09-29 DIAGNOSIS — Z1211 Encounter for screening for malignant neoplasm of colon: Secondary | ICD-10-CM

## 2023-09-29 NOTE — Telephone Encounter (Signed)
 Copied from CRM [id number]. Topic: Clinical - Request for Lab/Test Order >> Sep 29, 2023 10:58 AM Martha Clan wrote: Reason for CRM: Patient is requesting a home colon cancer kit.

## 2023-09-30 NOTE — Telephone Encounter (Signed)
 Cologuard Prescription sent to pharmacy

## 2023-09-30 NOTE — Addendum Note (Signed)
 Addended by: [name] A on: 09/30/2023 12:55 PM   Modules accepted: Orders

## 2023-10-01 ENCOUNTER — Other Ambulatory Visit: Payer: Self-pay | Admitting: *Deleted

## 2023-10-01 DIAGNOSIS — E785 Hyperlipidemia, unspecified: Secondary | ICD-10-CM

## 2023-10-01 DIAGNOSIS — F411 Generalized anxiety disorder: Secondary | ICD-10-CM

## 2023-10-01 DIAGNOSIS — M797 Fibromyalgia: Secondary | ICD-10-CM

## 2023-10-01 MED ORDER — ROSUVASTATIN CALCIUM 20 MG PO TABS: 20.0000 mg | ORAL_TABLET | Freq: Every day | ORAL | 0 refills | Status: DC

## 2023-10-01 MED ORDER — TOPIRAMATE 50 MG PO TABS: 50.0000 mg | ORAL_TABLET | Freq: Two times a day (BID) | ORAL | 0 refills | Status: DC

## 2023-10-01 MED ORDER — DULOXETINE HCL 60 MG PO CPEP: 60.0000 mg | ORAL_CAPSULE | Freq: Every day | ORAL | 0 refills | Status: DC

## 2023-10-03 ENCOUNTER — Telehealth: Payer: Self-pay

## 2023-10-03 MED ORDER — CYCLOBENZAPRINE HCL 10 MG PO TABS: 10.0000 mg | ORAL_TABLET | Freq: Three times a day (TID) | ORAL | 1 refills | Status: DC | PRN

## 2023-10-03 NOTE — Telephone Encounter (Signed)
Patient has appt 03/20  

## 2023-10-03 NOTE — Telephone Encounter (Signed)
 Copied from CRM [id number]. Topic: Clinical - Home Health Verbal Orders >> Oct 03, 2023 10:39 AM Marland Kitchen D wrote: Caller/Agency: [facility]  Callback Number:  [phone]  Service Requested: The agency is requesting a follow up appointment be made for the patient due to the patient's assessment coming back abnormal. Frequency: Agency wanted to make the office aware and the patient that the patient needs a follow up appointment. Any new concerns about the patient? Yes, abnormal assessment

## 2023-10-09 DIAGNOSIS — M9905 Segmental and somatic dysfunction of pelvic region: Secondary | ICD-10-CM | POA: Diagnosis not present

## 2023-10-09 DIAGNOSIS — M9903 Segmental and somatic dysfunction of lumbar region: Secondary | ICD-10-CM | POA: Diagnosis not present

## 2023-10-09 DIAGNOSIS — M62830 Muscle spasm of back: Secondary | ICD-10-CM | POA: Diagnosis not present

## 2023-10-09 DIAGNOSIS — M9904 Segmental and somatic dysfunction of sacral region: Secondary | ICD-10-CM | POA: Diagnosis not present

## 2023-10-16 ENCOUNTER — Ambulatory Visit: Payer: Medicare HMO | Admitting: Family

## 2023-10-16 ENCOUNTER — Encounter: Payer: Self-pay | Admitting: Family

## 2023-10-16 VITALS — BP 136/84 | HR 87 | Temp 97.9°F | Ht 64.0 in | Wt 219.0 lb

## 2023-10-16 DIAGNOSIS — N6312 Unspecified lump in the right breast, upper inner quadrant: Secondary | ICD-10-CM

## 2023-10-16 DIAGNOSIS — B370 Candidal stomatitis: Secondary | ICD-10-CM

## 2023-10-16 DIAGNOSIS — M150 Primary generalized (osteo)arthritis: Secondary | ICD-10-CM

## 2023-10-16 DIAGNOSIS — E1142 Type 2 diabetes mellitus with diabetic polyneuropathy: Secondary | ICD-10-CM

## 2023-10-16 DIAGNOSIS — M797 Fibromyalgia: Secondary | ICD-10-CM | POA: Diagnosis not present

## 2023-10-16 DIAGNOSIS — Z79899 Other long term (current) drug therapy: Secondary | ICD-10-CM

## 2023-10-16 DIAGNOSIS — E785 Hyperlipidemia, unspecified: Secondary | ICD-10-CM | POA: Diagnosis not present

## 2023-10-16 DIAGNOSIS — K219 Gastro-esophageal reflux disease without esophagitis: Secondary | ICD-10-CM | POA: Diagnosis not present

## 2023-10-16 DIAGNOSIS — K5900 Constipation, unspecified: Secondary | ICD-10-CM

## 2023-10-16 DIAGNOSIS — G8929 Other chronic pain: Secondary | ICD-10-CM

## 2023-10-16 DIAGNOSIS — F1320 Sedative, hypnotic or anxiolytic dependence, uncomplicated: Secondary | ICD-10-CM | POA: Diagnosis not present

## 2023-10-16 DIAGNOSIS — F411 Generalized anxiety disorder: Secondary | ICD-10-CM

## 2023-10-16 DIAGNOSIS — E559 Vitamin D deficiency, unspecified: Secondary | ICD-10-CM | POA: Diagnosis not present

## 2023-10-16 LAB — CMP14+EGFR
ALT: 12 IU/L (ref 0–32)
AST: 11 IU/L (ref 0–40)
Albumin: 4.5 g/dL (ref 3.8–4.8)
Alkaline Phosphatase: 99 IU/L (ref 44–121)
BUN/Creatinine Ratio: 10 — ABNORMAL LOW (ref 12–28)
BUN: 8 mg/dL (ref 8–27)
Bilirubin Total: 0.3 mg/dL (ref 0.0–1.2)
CO2: 22 mmol/L (ref 20–29)
Calcium: 9.7 mg/dL (ref 8.7–10.3)
Chloride: 100 mmol/L (ref 96–106)
Creatinine, Ser: 0.83 mg/dL (ref 0.57–1.00)
Globulin, Total: 2.3 g/dL (ref 1.5–4.5)
Glucose: 116 mg/dL — ABNORMAL HIGH (ref 70–99)
Potassium: 4.4 mmol/L (ref 3.5–5.2)
Sodium: 137 mmol/L (ref 134–144)
Total Protein: 6.8 g/dL (ref 6.0–8.5)
eGFR: 74 mL/min/{1.73_m2} (ref >59)

## 2023-10-16 LAB — BAYER DCA HB A1C WAIVED: HB A1C (BAYER DCA - WAIVED): 5.6 % (ref 4.8–5.6)

## 2023-10-16 MED ORDER — DICLOFENAC SODIUM 75 MG PO TBEC: 75.0000 mg | DELAYED_RELEASE_TABLET | Freq: Two times a day (BID) | ORAL | 1 refills | Status: DC

## 2023-10-16 MED ORDER — ROSUVASTATIN CALCIUM 20 MG PO TABS: 20.0000 mg | ORAL_TABLET | Freq: Every day | ORAL | 0 refills | Status: DC

## 2023-10-16 MED ORDER — DULOXETINE HCL 60 MG PO CPEP
60.0000 mg | ORAL_CAPSULE | Freq: Every day | ORAL | 2 refills | Status: DC
Start: 2023-10-16 — End: 2024-04-21

## 2023-10-16 MED ORDER — MAGIC MOUTHWASH W/LIDOCAINE: 10.0000 mL | Freq: Three times a day (TID) | ORAL | 0 refills | Status: DC | PRN

## 2023-10-16 MED ORDER — CYCLOBENZAPRINE HCL 10 MG PO TABS: 10.0000 mg | ORAL_TABLET | Freq: Three times a day (TID) | ORAL | 1 refills | Status: DC | PRN

## 2023-10-16 MED ORDER — ALPRAZOLAM 0.5 MG PO TABS: 0.5000 mg | ORAL_TABLET | Freq: Every evening | ORAL | 2 refills | Status: DC | PRN

## 2023-10-16 MED ORDER — TOPIRAMATE 50 MG PO TABS
50.0000 mg | ORAL_TABLET | Freq: Two times a day (BID) | ORAL | 0 refills | Status: DC
Start: 2023-10-16 — End: 2024-01-16

## 2023-10-16 NOTE — Patient Instructions (Signed)

## 2023-10-16 NOTE — Progress Notes (Signed)
 Subjective:    Patient ID: Cynthia Leon, female    DOB: Apr 16, 1951, 73 y.o.   MRN: [id number]  Chief Complaint  Patient presents with   Medical Management of Chronic Issues   Arm Pain    Soreness both arms. Been seen before both arms. Right arm has a knot up under arm.    Oral Pain   Pt presents to the office today for chronic follow up.   Pt is morbid obese today with a BMI of 37 and co-moribility of DM and GERD.    Pt continues to have right shoulder pain of 9 out 10. She had a x-ray on 09/11/23 that showed, "1. Severe degenerative changes of the glenohumeral joint. 2. Subcortical lucency at the site of rotator cuff insertion of the humeral head. This could reflect underlying rotator cuff pathology."  Complaining of a right breast mass and tenderness she noticed last week. Has a mammogram 02/03/23.  Gastroesophageal Reflux She complains of belching and heartburn. This is a chronic problem. The current episode started more than 1 year ago. The problem occurs occasionally. Risk factors include obesity. She has tried a PPI for the symptoms. The treatment provided moderate relief.  Diabetes She presents for her follow-up diabetic visit. She has type 2 diabetes mellitus. Hypoglycemia symptoms include nervousness/anxiousness. Pertinent negatives for diabetes include no blurred vision and no foot paresthesias. Symptoms are stable. Risk factors for coronary artery disease include dyslipidemia, diabetes mellitus, hypertension, sedentary lifestyle and post-menopausal. She is following a generally healthy diet. (Does not check glucose at home) Eye exam is current.  Arthritis Presents for follow-up visit. She complains of pain and stiffness. Affected locations include the right knee, left knee, left shoulder and right shoulder. Her pain is at a severity of 8/10.  Hyperlipidemia This is a chronic problem. The current episode started more than 1 year ago. The problem is controlled. Recent  lipid tests were reviewed and are normal. Exacerbating diseases include obesity. Current antihyperlipidemic treatment includes statins. The current treatment provides moderate improvement of lipids. Risk factors for coronary artery disease include dyslipidemia, diabetes mellitus, hypertension and a sedentary lifestyle.  Anxiety Presents for follow-up visit. Symptoms include excessive worry, nervous/anxious behavior and palpitations. Symptoms occur occasionally. The severity of symptoms is moderate.    Constipation This is a chronic problem. The current episode started more than 1 year ago. The problem has been resolved since onset. Her stool frequency is 1 time per day. Risk factors include obesity. She has tried laxatives for the symptoms. The treatment provided moderate relief.  Oral Pain  This is a new problem. The current episode started in the past 7 days. The problem occurs constantly. The problem has been unchanged. The pain is at a severity of 8/10. The pain is mild. Associated symptoms comments: Tongue sore and burning. She has tried nothing for the symptoms. The treatment provided no relief.      Review of Systems  Eyes:  Negative for blurred vision.  Cardiovascular:  Positive for palpitations.  Gastrointestinal:  Positive for constipation and heartburn.  Musculoskeletal:  Positive for stiffness.  Psychiatric/Behavioral:  The patient is nervous/anxious.   All other systems reviewed and are negative.      Objective:   Physical Exam Vitals reviewed.  Constitutional:      General: She is not in acute distress.    Appearance: She is well-developed. She is obese.  HENT:     Head: Normocephalic and atraumatic.     Right Ear: Tympanic  membrane normal.     Left Ear: Tympanic membrane normal.     Mouth/Throat:     Comments: Coating on tongue Eyes:     Pupils: Pupils are equal, round, and reactive to light.  Neck:     Thyroid: No thyromegaly.  Cardiovascular:     Rate and  Rhythm: Normal rate and regular rhythm.     Heart sounds: Normal heart sounds. No murmur heard. Pulmonary:     Effort: Pulmonary effort is normal. No respiratory distress.     Breath sounds: Normal breath sounds. No wheezing.  Chest:  Breasts:    Right: Mass present.       Comments: Small movable nodule  Abdominal:     General: Bowel sounds are normal. There is no distension.     Palpations: Abdomen is soft.     Tenderness: There is no abdominal tenderness.  Musculoskeletal:        General: Tenderness present. Normal range of motion.     Cervical back: Normal range of motion and neck supple.     Comments: Pain in right shoulder with abduction   Skin:    General: Skin is warm and dry.  Neurological:     Mental Status: She is alert and oriented to person, place, and time.     Cranial Nerves: No cranial nerve deficit.     Deep Tendon Reflexes: Reflexes are normal and symmetric.  Psychiatric:        Behavior: Behavior normal.        Thought Content: Thought content normal.        Judgment: Judgment normal.       BP 136/84   Pulse 87   Temp 97.9 F (36.6 C) (Temporal)   Ht 5\' 4"  (1.626 m)   Wt 219 lb (99.3 kg)   SpO2 96%   BMI 37.59 kg/m      Assessment & Plan:  Cynthia Leon comes in today with chief complaint of Medical Management of Chronic Issues, Arm Pain (Soreness both arms. Been seen before both arms. Right arm has a knot up under arm. ), and Oral Pain   Diagnosis and orders addressed:  1. GAD (generalized anxiety disorder) - ALPRAZolam (XANAX) 0.5 MG tablet; Take 1 tablet (0.5 mg total) by mouth at bedtime as needed for anxiety.  Dispense: 30 tablet; Refill: 2 - DULoxetine (CYMBALTA) 60 MG capsule; Take 1 capsule (60 mg total) by mouth daily.  Dispense: 100 capsule; Refill: 2 - CMP14+EGFR  2. Controlled substance agreement signed - ALPRAZolam (XANAX) 0.5 MG tablet; Take 1 tablet (0.5 mg total) by mouth at bedtime as needed for anxiety.  Dispense: 30  tablet; Refill: 2 - CMP14+EGFR  3. Benzodiazepine dependence (HCC) - ALPRAZolam (XANAX) 0.5 MG tablet; Take 1 tablet (0.5 mg total) by mouth at bedtime as needed for anxiety.  Dispense: 30 tablet; Refill: 2 - CMP14+EGFR  4. Fibromyalgia - cyclobenzaprine (FLEXERIL) 10 MG tablet; Take 1 tablet (10 mg total) by mouth 3 (three) times daily as needed for muscle spasms.  Dispense: 90 tablet; Refill: 1 - DULoxetine (CYMBALTA) 60 MG capsule; Take 1 capsule (60 mg total) by mouth daily.  Dispense: 100 capsule; Refill: 2 - CMP14+EGFR  5. Hyperlipidemia, unspecified hyperlipidemia type - rosuvastatin (CRESTOR) 20 MG tablet; Take 1 tablet (20 mg total) by mouth daily.  Dispense: 100 tablet; Refill: 0 - CMP14+EGFR  6. Type 2 diabetes mellitus with diabetic polyneuropathy, without long-term current use of insulin (HCC) (Primary) - Bayer DCA Hb  A1c Waived - CMP14+EGFR - Microalbumin / creatinine urine ratio  7. Constipation, unspecified constipation type - CMP14+EGFR  8. Diabetic polyneuropathy associated with type 2 diabetes mellitus (HCC) - CMP14+EGFR  9. Gastroesophageal reflux disease, unspecified whether esophagitis present - CMP14+EGFR  10. Morbid obesity (HCC) - CMP14+EGFR  11. Primary osteoarthritis involving multiple joints - diclofenac (VOLTAREN) 75 MG EC tablet; Take 1 tablet (75 mg total) by mouth 2 (two) times daily.  Dispense: 60 tablet; Refill: 1 - CMP14+EGFR  12. Vitamin D deficiency - CMP14+EGFR  13. Oral thrush - CMP14+EGFR - magic mouthwash w/lidocaine SOLN; Take 10 mLs by mouth 3 (three) times daily as needed for mouth pain.  Dispense: 250 mL; Refill: 0  14. Chronic right shoulder pain - Ambulatory referral to Orthopedic Surgery  15. Mass of upper inner quadrant of right breast - MM Digital Diagnostic Bilat; Future - US BREAST COMPLETE UNI RIGHT INC AXILLA    Low carb diet Patient reviewed in [place] controlled database, no flags noted. Contract and drug screen  are up to date.  Continue current medications Continue current medications  Health Maintenance reviewed Diet and exercise encouraged  Follow up plan: 3 months   [name], FNP

## 2023-10-17 LAB — MICROALBUMIN / CREATININE URINE RATIO
Creatinine, Urine: 19.7 mg/dL
Microalb/Creat Ratio: 23 mg/g{creat} (ref 0–29)
Microalbumin, Urine: 4.6 ug/mL

## 2023-10-22 ENCOUNTER — Telehealth: Payer: Self-pay | Admitting: Family

## 2023-10-22 ENCOUNTER — Other Ambulatory Visit: Payer: Self-pay

## 2023-10-22 DIAGNOSIS — N6312 Unspecified lump in the right breast, upper inner quadrant: Secondary | ICD-10-CM

## 2023-10-22 NOTE — Telephone Encounter (Signed)
 Copied from CRM [id number]. Topic: General - Call Back - No Documentation >> Oct 22, 2023 12:02 PM Cynthia Leon wrote: Reason for CRM: patient is calling said that she was suppose to get scheduled for a chest scan and ultrasound and she has not heard anything please call patient back [phone]

## 2023-10-22 NOTE — Telephone Encounter (Signed)
 Called and spoke to patient - informed her that orders have been sent to [facility] and that they will call with appointment. Advised the patient that she can call and see if they can go ahead a schedule her - contact information provided.

## 2023-10-29 ENCOUNTER — Encounter: Payer: Self-pay | Admitting: Orthopedic Surgery

## 2023-10-29 ENCOUNTER — Ambulatory Visit: Admitting: Orthopedic Surgery

## 2023-10-29 VITALS — BP 107/73 | HR 89 | Ht 64.0 in | Wt 219.0 lb

## 2023-10-29 DIAGNOSIS — M19011 Primary osteoarthritis, right shoulder: Secondary | ICD-10-CM | POA: Diagnosis not present

## 2023-10-29 NOTE — Patient Instructions (Signed)

## 2023-10-29 NOTE — Progress Notes (Signed)
 New Patient Visit  Assessment: Cynthia Leon is a 73 y.o. female with the following: Right glenohumeral joint arthritis  Plan: JAMARIS BIERNAT has progressively worsening pain in the right shoulder.  Radiographs of the humerus indicate advanced arthritic changes, with a large inferior osteophyte.  Description of the pain is consistent with arthritis.  This was discussed with the patient.  We elected to proceed with a subacromial steroid injection.  This was completed without issues.  She will return to clinic as needed.  We can consider an ultrasound-guided injection in the future.  Procedure note injection - Right shoulder    Verbal consent was obtained to inject the right shoulder, subacromial space Timeout was completed to confirm the site of injection.   The skin was prepped with alcohol and ethyl chloride was sprayed at the injection site.  A 21-gauge needle was used to inject 40 mg of Depo-Medrol and 1% lidocaine (4 cc) into the subacromial space of the right shoulder using a posterolateral approach.  There were no complications.  A sterile bandage was applied.    Follow-up: Return if symptoms worsen or fail to improve.  Subjective:  Chief Complaint  Patient presents with   Shoulder Pain    R on and off ofr yrs. Pt states she has tried heating pad, muscle relaxers, and ibuprofen. Pt states she fell off a ladder 10-12 yrs ago and pain has steadily gotten worse since then.     History of Present Illness: Cynthia Leon is a 73 y.o. female who has been referred by [name], FNP for evaluation of right shoulder pain.  She is right-hand dominant.  She states that she has been having issues in the shoulder for several months.  It is progressively worsening.  She has had intermittent shoulder pain for over 10 years, after falling off a ladder.  Recently, she has tried some ibuprofen, heating pad and muscle relaxers.  Pain gets worse throughout the day.   Occasionally, she will have difficulty getting her clothes off.  She has not had injections.  No therapy.  She notes some pain radiating distally.  She has difficulty with overhead motion.   Review of Systems: No fevers or chills No numbness or tingling No chest pain No shortness of breath No bowel or bladder dysfunction No GI distress No headaches   Medical History:  Past Medical History:  Diagnosis Date   Abdominal pain    Allergic rhinitis    Anxiety    Constipation    Fibromyalgia    GERD (gastroesophageal reflux disease)    Head ache    Hyperlipidemia    Osteopenia    PUD (peptic ulcer disease)    Right hip pain    Shingles     Past Surgical History:  Procedure Laterality Date   COLONOSCOPY N/A 12/13/2015   Procedure: COLONOSCOPY;  Surgeon: Malissa Hippo, MD;  Location: [facility];  Service: Endoscopy;  Laterality: N/A;  1030   TOTAL HIP ARTHROPLASTY Right     Family History  Problem Relation Age of Onset   Breast cancer Mother    Coronary artery disease Mother    Cancer Mother        breast   Emphysema Father    COPD Father    Hypertension Father    Heart disease Father    Diabetes Sister    Cancer Sister        throat   Coronary artery disease Sister    Diabetes  Sister    Esophageal cancer Sister    Cancer Brother        brain   Cancer Brother        bone   Cancer Brother        lung   Cancer Brother    Lupus Brother    Healthy Daughter    Healthy Daughter    Healthy Daughter    Healthy Daughter    Social History   Tobacco Use   Smoking status: Never    Passive exposure: Yes   Smokeless tobacco: Never  Vaping Use   Vaping status: Never Used  Substance Use Topics   Alcohol use: No   Drug use: No    Allergies  Allergen Reactions   Mobic [Meloxicam] Nausea And Vomiting   Asa [Aspirin] Nausea And Vomiting   Gabapentin Anxiety    Restless, couldn't sleep    Current Meds  Medication Sig   ALPRAZolam (XANAX) 0.5 MG tablet  Take 1 tablet (0.5 mg total) by mouth at bedtime as needed for anxiety.   Calcium & Magnesium Carbonates (MYLANTA PO) Take by mouth as needed.   cyclobenzaprine (FLEXERIL) 10 MG tablet Take 1 tablet (10 mg total) by mouth 3 (three) times daily as needed for muscle spasms.   diclofenac (VOLTAREN) 75 MG EC tablet Take 1 tablet (75 mg total) by mouth 2 (two) times daily.   DULoxetine (CYMBALTA) 60 MG capsule Take 1 capsule (60 mg total) by mouth daily.   famotidine (PEPCID) 20 MG tablet TAKE 1 TABLET BY MOUTH AFTER SUPPER   magic mouthwash w/lidocaine SOLN Take 10 mLs by mouth 3 (three) times daily as needed for mouth pain.   pantoprazole (PROTONIX) 40 MG tablet TAKE 1 TABLET (40 MG TOTAL) BY MOUTH DAILY. TAKE 30-60 MINUTES BEFORE FIRST MEAL OF THE DAY   rosuvastatin (CRESTOR) 20 MG tablet Take 1 tablet (20 mg total) by mouth daily.   topiramate (TOPAMAX) 50 MG tablet Take 1 tablet (50 mg total) by mouth 2 (two) times daily.    Objective: BP 107/73   Pulse 89   Ht 5\' 4"  (1.626 m)   Wt 219 lb (99.3 kg)   BMI 37.59 kg/m   Physical Exam:  General: Alert and oriented. and No acute distress. Gait: Normal gait.  Evaluation of the right shoulder demonstrates no deformity.  No redness.  No swelling.  Forward flexion limited to 100 degrees.  External rotation at her side is limited to approximately 35 degrees, with some discomfort.  Fingers are warm and well-perfused.  Sensation intact throughout the right upper extremity.  IMAGING: I personally reviewed images previously obtained in clinic  X-rays of the right humerus were previously obtained.  These did not include clear views of the shoulder, but there is evidence of advanced degenerative changes.  She has a large inferior osteophyte off the humeral head, which is consistent with advanced arthritis.   New Medications:  No orders of the defined types were placed in this encounter.     Oliver Barre, MD  10/29/2023 10:07 AM

## 2023-11-03 ENCOUNTER — Telehealth: Payer: Self-pay

## 2023-11-03 NOTE — Telephone Encounter (Signed)
 CALLED AND SPOKE WITH PATIENT SHE IS ASKING IF WE HAVE ANY UPDATE ON GETTING HER MAMMO AND U/S SCHEDULED I SEE THE REFERRAL IS PUT IN AND CAN WE CHECK ON WHEN PATIENT CAN GET SCHEDULE SHE HAS NOT HERD ANYTHING?

## 2023-11-03 NOTE — Telephone Encounter (Signed)
 Called patient and notified her that orders were sent to [facility] and they would call to schedule that appointment - gave patient the contact information so she can call the department and schedule since she has not heard from them

## 2023-11-03 NOTE — Telephone Encounter (Signed)
 Copied from CRM [id number]. Topic: General - Other >> Nov 03, 2023 11:03 AM Elizebeth Brooking wrote: Reason for CRM: Patient called In would like for nurse to give her a callback at [phone]

## 2023-11-06 DIAGNOSIS — M9903 Segmental and somatic dysfunction of lumbar region: Secondary | ICD-10-CM | POA: Diagnosis not present

## 2023-11-06 DIAGNOSIS — M9904 Segmental and somatic dysfunction of sacral region: Secondary | ICD-10-CM | POA: Diagnosis not present

## 2023-11-06 DIAGNOSIS — M62830 Muscle spasm of back: Secondary | ICD-10-CM | POA: Diagnosis not present

## 2023-11-06 DIAGNOSIS — M9905 Segmental and somatic dysfunction of pelvic region: Secondary | ICD-10-CM | POA: Diagnosis not present

## 2023-11-07 NOTE — Telephone Encounter (Signed)
 Resent orders to [facility]

## 2023-11-27 ENCOUNTER — Ambulatory Visit (HOSPITAL_COMMUNITY)
Admission: RE | Admit: 2023-11-27 | Discharge: 2023-11-27 | Disposition: A | Discharge status: Home / Self Care | Source: Ambulatory Visit | Attending: Family | Admitting: Family

## 2023-11-27 DIAGNOSIS — N6310 Unspecified lump in the right breast, unspecified quadrant: Secondary | ICD-10-CM | POA: Diagnosis not present

## 2023-11-27 DIAGNOSIS — N6312 Unspecified lump in the right breast, upper inner quadrant: Secondary | ICD-10-CM | POA: Diagnosis not present

## 2023-11-27 DIAGNOSIS — R92321 Mammographic fibroglandular density, right breast: Secondary | ICD-10-CM | POA: Diagnosis not present

## 2023-12-08 ENCOUNTER — Ambulatory Visit (INDEPENDENT_AMBULATORY_CARE_PROVIDER_SITE_OTHER): Payer: Medicare HMO

## 2023-12-08 VITALS — BP 107/73 | HR 89 | Wt 219.0 lb

## 2023-12-08 DIAGNOSIS — Z0000 Encounter for general adult medical examination without abnormal findings: Secondary | ICD-10-CM | POA: Diagnosis not present

## 2023-12-08 NOTE — Patient Instructions (Signed)
 Cynthia Leon , Thank you for taking time out of your busy schedule to complete your Annual Wellness Visit with me. I enjoyed our conversation and look forward to speaking with you again next year. I, as well as your care team,  appreciate your ongoing commitment to your health goals. Please review the following plan we discussed and let me know if I can assist you in the future.   Follow up Visits: Next Medicare AWV with our clinical staff: Wed., 12/08/24 at 2:30p.m.   Next Office Visit with your provider: 01/19/24 at 9:25a.m.  Clinician Recommendations:  Aim for 30 minutes of exercise or brisk walking, 6-8 glasses of water , and 5 servings of fruits and vegetables each day. N/a      This is a list of the screening recommended for you and due dates:  Health Maintenance  Topic Date Due   Zoster (Shingles) Vaccine (1 of 2) 12/09/2023*   COVID-19 Vaccine (1) 12/23/2024*   Complete foot exam   02/03/2024   Mammogram  02/03/2024   Flu Shot  02/27/2024   Eye exam for diabetics  03/05/2024   Hemoglobin A1C  04/17/2024   Cologuard (Stool DNA test)  05/02/2024   Yearly kidney function blood test for diabetes  10/15/2024   Yearly kidney health urinalysis for diabetes  10/15/2024   Medicare Annual Wellness Visit  12/07/2024   DEXA scan (bone density measurement)  05/07/2025   Colon Cancer Screening  12/12/2025   DTaP/Tdap/Td vaccine (3 - Tdap) 11/24/2028   Pneumonia Vaccine  Completed   Hepatitis C Screening  Completed   HPV Vaccine  Aged Out   Meningitis B Vaccine  Aged Out  *Topic was postponed. The date shown is not the original due date.    Advanced directives: (Copy Requested) Please bring a copy of your health care power of attorney and living will to the office to be added to your chart at your convenience. You can mail to [facility] [address] [place], [place] [address] or email to [email or web address] Advance Care Planning is important because it:  [x]   Makes sure you receive the medical care that is consistent with your values, goals, and preferences  [x]  It provides guidance to your family and loved ones and reduces their decisional burden about whether or not they are making the right decisions based on your wishes.  Follow the link provided in your after visit summary or read over the paperwork we have mailed to you to help you started getting your Advance Directives in place. If you need assistance in completing these, please reach out to us  so that we can help you!  See attachments for Preventive Care and Fall Prevention Tips.

## 2023-12-08 NOTE — Progress Notes (Signed)
 Subjective:   Cynthia Leon is a 73 y.o. who presents for a Medicare Wellness preventive visit.  As a reminder, Annual Wellness Visits don't include a physical exam, and some assessments may be limited, especially if this visit is performed virtually. We may recommend an in-person visit if needed.  Visit Complete: Virtual I connected with  Cynthia Leon on 12/08/23 by a audio enabled telemedicine application and verified that I am speaking with the correct person using two identifiers.  Patient Location: Home  Provider Location: Home Office  I discussed the limitations of evaluation and management by telemedicine. The patient expressed understanding and agreed to proceed.  Vital Signs: Because this visit was a virtual/telehealth visit, some criteria may be missing or patient reported. Any vitals not documented were not able to be obtained and vitals that have been documented are patient reported.  VideoDeclined- This patient declined Librarian, academic. Therefore the visit was completed with audio only.  Persons Participating in Visit: Patient.  AWV Questionnaire: No: Patient Medicare AWV questionnaire was not completed prior to this visit.  Cardiac Risk Factors include: advanced age (>44men, >40 women);diabetes mellitus;dyslipidemia;obesity (BMI >30kg/m2)     Objective:     Today's Vitals   12/08/23 1513 12/08/23 1612  BP:  107/73  Pulse:  89  Weight:  219 lb (99.3 kg)  PainSc: 7     Body mass index is 37.59 kg/m.     12/08/2023    3:22 PM 12/05/2022   11:59 AM 12/03/2021   12:12 PM 12/01/2020    9:37 AM 12/01/2019    9:00 AM 11/30/2018    9:08 AM 11/26/2017   10:45 AM  Advanced Directives  Does Patient Have a Medical Advance Directive? Yes Yes No Yes Yes No No  Type of Advance Directive Living will;Healthcare Power of [name] Power of [place];Living will  Healthcare Power of [place];Living will Healthcare Power of  Attorney    Does patient want to make changes to medical advance directive?     No - Patient declined    Copy of Healthcare Power of Attorney in Chart?  No - copy requested  No - copy requested     Would patient like information on creating a medical advance directive?   No - Patient declined   Yes (MAU/Ambulatory/Procedural Areas - Information given) No - Patient declined    Current Medications (verified) Outpatient Encounter Medications as of 12/08/2023  Medication Sig   ALPRAZolam  (XANAX ) 0.5 MG tablet Take 1 tablet (0.5 mg total) by mouth at bedtime as needed for anxiety.   Calcium  & Magnesium Carbonates (MYLANTA PO) Take by mouth as needed.   cyclobenzaprine  (FLEXERIL ) 10 MG tablet Take 1 tablet (10 mg total) by mouth 3 (three) times daily as needed for muscle spasms.   diclofenac  (VOLTAREN ) 75 MG EC tablet Take 1 tablet (75 mg total) by mouth 2 (two) times daily.   DULoxetine  (CYMBALTA ) 60 MG capsule Take 1 capsule (60 mg total) by mouth daily.   famotidine  (PEPCID ) 20 MG tablet TAKE 1 TABLET BY MOUTH AFTER SUPPER   magic mouthwash w/lidocaine  SOLN Take 10 mLs by mouth 3 (three) times daily as needed for mouth pain.   pantoprazole  (PROTONIX ) 40 MG tablet TAKE 1 TABLET (40 MG TOTAL) BY MOUTH DAILY. TAKE 30-60 MINUTES BEFORE FIRST MEAL OF THE DAY   rosuvastatin  (CRESTOR ) 20 MG tablet Take 1 tablet (20 mg total) by mouth daily.   topiramate  (TOPAMAX ) 50 MG tablet Take 1  tablet (50 mg total) by mouth 2 (two) times daily.   furosemide  (LASIX ) 20 MG tablet Take 1 tablet (20 mg total) by mouth daily.   No facility-administered encounter medications on file as of 12/08/2023.    Allergies (verified) Mobic  [meloxicam ], Asa [aspirin], and Gabapentin    History: Past Medical History:  Diagnosis Date   Abdominal pain    Allergic rhinitis    Anxiety    Constipation    Fibromyalgia    GERD (gastroesophageal reflux disease)    Head ache    Hyperlipidemia    Osteopenia    PUD (peptic ulcer  disease)    Right hip pain    Shingles    Past Surgical History:  Procedure Laterality Date   COLONOSCOPY N/A 12/13/2015   Procedure: COLONOSCOPY;  Surgeon: Ruby Corporal, MD;  Location: [facility];  Service: Endoscopy;  Laterality: N/A;  1030   TOTAL HIP ARTHROPLASTY Right    Family History  Problem Relation Age of Onset   Breast cancer Mother    Coronary artery disease Mother    Cancer Mother        breast   Emphysema Father    COPD Father    Hypertension Father    Heart disease Father    Diabetes Sister    Cancer Sister        throat   Coronary artery disease Sister    Diabetes Sister    Esophageal cancer Sister    Cancer Brother        brain   Cancer Brother        bone   Cancer Brother        lung   Cancer Brother    Lupus Brother    Healthy Daughter    Healthy Daughter    Healthy Daughter    Healthy Daughter    Social History   Socioeconomic History   Marital status: Married    Spouse name: Cynthia Leon   Number of children: 3   Years of education: 11   Highest education level: 11th grade  Occupational History   Occupation: disabled   Tobacco Use   Smoking status: Never    Passive exposure: Yes   Smokeless tobacco: Never  Vaping Use   Vaping status: Never Used  Substance and Sexual Activity   Alcohol use: No   Drug use: No   Sexual activity: Yes  Other Topics Concern   Not on file  Social History Narrative   Lives with her husband, their 3 daughters all live nearby   Social Drivers of Health   Financial Resource Strain: Low Risk  (12/08/2023)   Overall Financial Resource Strain (CARDIA)    Difficulty of Paying Living Expenses: Not hard at all  Food Insecurity: No Food Insecurity (12/08/2023)   Hunger Vital Sign    Worried About Running Out of Food in the Last Year: Never true    Ran Out of Food in the Last Year: Never true  Transportation Needs: No Transportation Needs (12/08/2023)   PRAPARE - Administrator, Civil Service  (Medical): No    Lack of Transportation (Non-Medical): No  Physical Activity: Inactive (12/08/2023)   Exercise Vital Sign    Days of Exercise per Week: 7 days    Minutes of Exercise per Session: 0 min  Stress: No Stress Concern Present (12/05/2022)   [employer] of Occupational Health - Occupational Stress Questionnaire    Feeling of Stress : Not at all  Social  Connections: Moderately Integrated (12/08/2023)   Social Connection and Isolation Panel [NHANES]    Frequency of Communication with Friends and Family: More than three times a week    Frequency of Social Gatherings with Friends and Family: More than three times a week    Attends Religious Services: More than 4 times per year    Active Member of [employer] or Organizations: No    Attends Engineer, structural: Never    Marital Status: Married    Tobacco Counseling Counseling given: Yes    Clinical Intake:     Pain : 0-10 (per pt arthrititis joints) Pain Score: 7  Pain Type: Chronic pain Pain Location:  (per pt her joints) Pain Orientation: Other (Comment) (joints) Pain Onset: Other (comment) Pain Relieving Factors: pt takes ibuprofen  Pain Relieving Factors: pt takes ibuprofen  BMI - recorded: 37.59 Diabetes: No (per pt boarderline)  Lab Results  Component Value Date   HGBA1C 5.6 10/16/2023   HGBA1C 5.9 (H) 02/03/2023   HGBA1C 6.8 (H) 08/05/2022     How often do you need to have someone help you when you read instructions, pamphlets, or other written materials from your doctor or pharmacy?: 1 - Never  Interpreter Needed?: No  Information entered by :: Alia T/cma   Activities of Daily Living     12/08/2023    3:17 PM  In your present state of health, do you have any difficulty performing the following activities:  Hearing? 0  Vision? 0  Difficulty concentrating or making decisions? 0  Walking or climbing stairs? 1  Comment a little bit  Dressing or bathing? 0  Doing errands, shopping? 1   Comment pt's husband drives pt to/from dr office  Preparing Food and eating ? N  Using the Toilet? N  In the past six months, have you accidently leaked urine? N  Do you have problems with loss of bowel control? N  Managing your Medications? N  Managing your Finances? N  Housekeeping or managing your Housekeeping? N    Patient Care Team: Yevette Hem, FNP as PCP - General (Family Medicine) Lasalle Pointer, MD as PCP - Cardiology (Cardiology) Erwin Heck as Physician Assistant (Chiropractic Medicine) Alexia Idler, OD (Optometry)  Indicate any recent Medical Services you may have received from other than [facility] providers in the past year (date may be approximate).     Assessment:    This is a routine wellness examination for Jadene.  Hearing/Vision screen Hearing Screening - Comments:: Pt denies hearing dif  Vision Screening - Comments:: pt wear glasses. pt go[facility]   Goals Addressed             This Visit's Progress    Exercise 150 min/wk Moderate Activity   On track    Walking is a great option.     Patient Stated       To be healthy/active-diet/walking a little more       Depression Screen     12/08/2023    3:27 PM 09/11/2023    9:38 AM 12/05/2022   11:58 AM 12/04/2022    1:04 PM 08/05/2022   12:26 PM 03/22/2022    4:04 PM 01/24/2022    9:14 AM  PHQ 2/9 Scores  PHQ - 2 Score 2 0 0 0 0 0 0  PHQ- 9 Score 18 0 0 0 0 0     Fall Risk     12/08/2023    3:17 PM 09/11/2023  9:38 AM 12/05/2022   11:57 AM 12/04/2022    1:05 PM 08/05/2022   12:27 PM  Fall Risk   Falls in the past year? 0 0 0 0 0  Number falls in past yr: 0 0 0 0 0  Injury with Fall? 0 0 0 0 0  Risk for fall due to : No Fall Risks No Fall Risks No Fall Risks No Fall Risks No Fall Risks  Follow up Falls prevention discussed;Falls evaluation completed Falls evaluation completed Falls prevention discussed Falls evaluation completed Falls evaluation completed     MEDICARE RISK AT HOME:  Medicare Risk at Home Any stairs in or around the home?: Yes If so, are there any without handrails?: Yes Home free of loose throw rugs in walkways, pet beds, electrical cords, etc?: Yes Adequate lighting in your home to reduce risk of falls?: Yes Life alert?: No Use of a cane, walker or w/c?: No Grab bars in the bathroom?: No Shower chair or bench in shower?: Yes Elevated toilet seat or a handicapped toilet?: No  TIMED UP AND GO:  Was the test performed?  no  Cognitive Function: 6CIT completed    11/26/2017    9:17 AM 11/25/2016   10:47 AM  MMSE - Mini Mental State Exam  Orientation to time 5 5  Orientation to Place 5 5  Registration 3 2  Registration-comments  HAD TO REPEAT FOR PATIENT  Attention/ Calculation 5 5  Recall 2 3  Language- name 2 objects 2 2  Language- repeat 1 1  Language- follow 3 step command 3 3  Language- read & follow direction 1 1  Write a sentence 1 1  Copy design 1 1  Total score 29 29        12/08/2023    3:31 PM 12/05/2022   12:00 PM 12/03/2021   12:13 PM 12/01/2019    9:06 AM 11/30/2018    9:09 AM  6CIT Screen  What Year? 0 points 0 points 0 points 0 points 0 points  What month? 0 points 0 points 3 points 0 points 0 points  What time? 0 points 0 points 0 points 0 points 0 points  Count back from 20 0 points 0 points 0 points 0 points 0 points  Months in reverse 4 points 0 points 4 points 0 points 0 points  Repeat phrase 8 points 0 points 6 points 4 points 4 points  Total Score 12 points 0 points 13 points 4 points 4 points    Immunizations Immunization History  Administered Date(s) Administered   Pneumococcal Conjugate-13 11/09/2015   Pneumococcal Polysaccharide-23 11/25/2016   Td 11/02/2008, 11/25/2018    Screening Tests Health Maintenance  Topic Date Due   Zoster Vaccines- Shingrix (1 of 2) 12/09/2023 (Originally 07/18/1970)   COVID-19 Vaccine (1) 12/23/2024 (Originally 07/19/1956)   FOOT EXAM   02/03/2024   MAMMOGRAM  02/03/2024   INFLUENZA VACCINE  02/27/2024   OPHTHALMOLOGY EXAM  03/05/2024   HEMOGLOBIN A1C  04/17/2024   Fecal DNA (Cologuard)  05/02/2024   Diabetic kidney evaluation - eGFR measurement  10/15/2024   Diabetic kidney evaluation - Urine ACR  10/15/2024   Medicare Annual Wellness (AWV)  12/07/2024   DEXA SCAN  05/07/2025   Colonoscopy  12/12/2025   DTaP/Tdap/Td (3 - Tdap) 11/24/2028   Pneumonia Vaccine 31+ Years old  Completed   Hepatitis C Screening  Completed   HPV VACCINES  Aged Out   Meningococcal B Vaccine  Aged Out  Health Maintenance  There are no preventive care reminders to display for this patient.  Health Maintenance Items Addressed: See Nurse Notes  Additional Screening:  Vision Screening: Recommended annual ophthalmology exams for early detection of glaucoma and other disorders of the eye.  Dental Screening: Recommended annual dental exams for proper oral hygiene  Community Resource Referral / Chronic Care Management: CRR required this visit?  No   CCM required this visit?  No   Plan:    I have personally reviewed and noted the following in the patient's chart:   Medical and social history Use of alcohol, tobacco or illicit drugs  Current medications and supplements including opioid prescriptions. Patient is not currently taking opioid prescriptions. Functional ability and status Nutritional status Physical activity Advanced directives List of other physicians Hospitalizations, surgeries, and ER visits in previous 12 months Vitals Screenings to include cognitive, depression, and falls Referrals and appointments  In addition, I have reviewed and discussed with patient certain preventive protocols, quality metrics, and best practice recommendations. A written personalized care plan for preventive services as well as general preventive health recommendations were provided to patient.   Michaelle Adolphus, CMA   12/08/2023    After Visit Summary: (MyChart) Due to this being a telephonic visit, the after visit summary with patients personalized plan was offered to patient via MyChart   Notes: Please refer to Routing Comments.

## 2023-12-13 ENCOUNTER — Other Ambulatory Visit: Payer: Self-pay | Admitting: Family

## 2023-12-13 DIAGNOSIS — M150 Primary generalized (osteo)arthritis: Secondary | ICD-10-CM

## 2023-12-20 ENCOUNTER — Other Ambulatory Visit: Payer: Self-pay | Admitting: Family

## 2023-12-20 DIAGNOSIS — R059 Cough, unspecified: Secondary | ICD-10-CM

## 2023-12-23 ENCOUNTER — Other Ambulatory Visit: Payer: Self-pay | Admitting: Internal Medicine

## 2024-01-19 ENCOUNTER — Encounter: Payer: Self-pay | Admitting: Family

## 2024-01-19 ENCOUNTER — Ambulatory Visit (INDEPENDENT_AMBULATORY_CARE_PROVIDER_SITE_OTHER): Admitting: Family

## 2024-01-19 VITALS — BP 129/75 | HR 93 | Temp 97.1°F | Ht 64.0 in | Wt 231.0 lb

## 2024-01-19 DIAGNOSIS — K219 Gastro-esophageal reflux disease without esophagitis: Secondary | ICD-10-CM

## 2024-01-19 DIAGNOSIS — E785 Hyperlipidemia, unspecified: Secondary | ICD-10-CM | POA: Diagnosis not present

## 2024-01-19 DIAGNOSIS — R0602 Shortness of breath: Secondary | ICD-10-CM | POA: Diagnosis not present

## 2024-01-19 DIAGNOSIS — M150 Primary generalized (osteo)arthritis: Secondary | ICD-10-CM | POA: Diagnosis not present

## 2024-01-19 DIAGNOSIS — M797 Fibromyalgia: Secondary | ICD-10-CM

## 2024-01-19 DIAGNOSIS — Z0001 Encounter for general adult medical examination with abnormal findings: Secondary | ICD-10-CM

## 2024-01-19 DIAGNOSIS — F1320 Sedative, hypnotic or anxiolytic dependence, uncomplicated: Secondary | ICD-10-CM | POA: Diagnosis not present

## 2024-01-19 DIAGNOSIS — K5900 Constipation, unspecified: Secondary | ICD-10-CM

## 2024-01-19 DIAGNOSIS — E559 Vitamin D deficiency, unspecified: Secondary | ICD-10-CM | POA: Diagnosis not present

## 2024-01-19 DIAGNOSIS — Z0000 Encounter for general adult medical examination without abnormal findings: Secondary | ICD-10-CM | POA: Diagnosis not present

## 2024-01-19 DIAGNOSIS — F411 Generalized anxiety disorder: Secondary | ICD-10-CM

## 2024-01-19 DIAGNOSIS — E1142 Type 2 diabetes mellitus with diabetic polyneuropathy: Secondary | ICD-10-CM

## 2024-01-19 DIAGNOSIS — Z79899 Other long term (current) drug therapy: Secondary | ICD-10-CM

## 2024-01-19 LAB — BAYER DCA HB A1C WAIVED: HB A1C (BAYER DCA - WAIVED): 5.9 % — ABNORMAL HIGH (ref 4.8–5.6)

## 2024-01-19 MED ORDER — ALPRAZOLAM 0.5 MG PO TABS: 0.5000 mg | ORAL_TABLET | Freq: Every evening | ORAL | 2 refills | Status: DC | PRN

## 2024-01-19 MED ORDER — TOPIRAMATE 50 MG PO TABS: 50.0000 mg | ORAL_TABLET | Freq: Two times a day (BID) | ORAL | 2 refills | Status: DC

## 2024-01-19 MED ORDER — CYCLOBENZAPRINE HCL 10 MG PO TABS
10.0000 mg | ORAL_TABLET | Freq: Three times a day (TID) | ORAL | 1 refills | Status: DC | PRN
Start: 2024-01-19 — End: 2024-04-21

## 2024-01-19 MED ORDER — ROSUVASTATIN CALCIUM 20 MG PO TABS: 20.0000 mg | ORAL_TABLET | Freq: Every day | ORAL | 2 refills | Status: AC

## 2024-01-19 MED ORDER — ALBUTEROL SULFATE HFA 108 (90 BASE) MCG/ACT IN AERS: 2.0000 | INHALATION_SPRAY | Freq: Four times a day (QID) | RESPIRATORY_TRACT | 0 refills | Status: DC | PRN

## 2024-01-19 NOTE — Progress Notes (Signed)
 Subjective:    Patient ID: Cynthia Leon, female    DOB: 1950-12-04, 73 y.o.   MRN: [id number]  Chief Complaint  Patient presents with   Medical Management of Chronic Issues    Left leg sore from getting up in daughters truck. Hands swelling and painful    Pt presents to the office today for CPE and chronic follow up.   Pt is morbid obese today with a BMI of 39 and co-moribility of DM and GERD.    Gastroesophageal Reflux She complains of belching and heartburn. This is a chronic problem. The current episode started more than 1 year ago. The problem occurs occasionally. The symptoms are aggravated by certain foods. Risk factors include obesity. She has tried a PPI for the symptoms. The treatment provided moderate relief.  Diabetes She presents for her follow-up diabetic visit. She has type 2 diabetes mellitus. Hypoglycemia symptoms include nervousness/anxiousness. Associated symptoms include blurred vision. Pertinent negatives for diabetes include no foot paresthesias. Symptoms are stable. Risk factors for coronary artery disease include dyslipidemia, diabetes mellitus, hypertension, sedentary lifestyle and post-menopausal. She is following a generally unhealthy diet. (Does not check glucose at home) Eye exam is current.  Arthritis Presents for follow-up visit. She complains of pain and stiffness. The symptoms have been stable. Affected locations include the right knee, left knee, left shoulder and right shoulder. Her pain is at a severity of 7/10.  Hyperlipidemia This is a chronic problem. The current episode started more than 1 year ago. The problem is controlled. Recent lipid tests were reviewed and are normal. Exacerbating diseases include obesity. Current antihyperlipidemic treatment includes statins. The current treatment provides moderate improvement of lipids. Risk factors for coronary artery disease include dyslipidemia, diabetes mellitus, hypertension, a sedentary lifestyle and  obesity.  Anxiety Presents for follow-up visit. Symptoms include excessive worry, nervous/anxious behavior and palpitations. Symptoms occur occasionally. The severity of symptoms is moderate.    Constipation This is a chronic problem. The current episode started more than 1 year ago. The problem has been resolved since onset. Her stool frequency is 1 time per day. Risk factors include obesity. She has tried laxatives for the symptoms. The treatment provided moderate relief.      Review of Systems  Eyes:  Positive for blurred vision.  Cardiovascular:  Positive for palpitations.  Gastrointestinal:  Positive for constipation and heartburn.  Musculoskeletal:  Positive for stiffness.  Psychiatric/Behavioral:  The patient is nervous/anxious.   All other systems reviewed and are negative.  Family History  Problem Relation Age of Onset   Breast cancer Mother    Coronary artery disease Mother    Cancer Mother        breast   Emphysema Father    COPD Father    Hypertension Father    Heart disease Father    Diabetes Sister    Cancer Sister        throat   Coronary artery disease Sister    Diabetes Sister    Esophageal cancer Sister    Cancer Brother        brain   Cancer Brother        bone   Cancer Brother        lung   Cancer Brother    Lupus Brother    Healthy Daughter    Healthy Daughter    Healthy Daughter    Healthy Daughter    Social History   Socioeconomic History   Marital status: Married  Spouse name: Jayson   Number of children: 3   Years of education: 11   Highest education level: 11th grade  Occupational History   Occupation: disabled   Tobacco Use   Smoking status: Never    Passive exposure: Yes   Smokeless tobacco: Never  Vaping Use   Vaping status: Never Used  Substance and Sexual Activity   Alcohol use: No   Drug use: No   Sexual activity: Yes  Other Topics Concern   Not on file  Social History Narrative   Lives with her husband, their 3  daughters all live nearby   Social Drivers of Health   Financial Resource Strain: Low Risk  (12/08/2023)   Overall Financial Resource Strain (CARDIA)    Difficulty of Paying Living Expenses: Not hard at all  Food Insecurity: No Food Insecurity (12/08/2023)   Hunger Vital Sign    Worried About Running Out of Food in the Last Year: Never true    Ran Out of Food in the Last Year: Never true  Transportation Needs: No Transportation Needs (12/08/2023)   PRAPARE - Administrator, Civil Service (Medical): No    Lack of Transportation (Non-Medical): No  Physical Activity: Inactive (12/08/2023)   Exercise Vital Sign    Days of Exercise per Week: 7 days    Minutes of Exercise per Session: 0 min  Stress: No Stress Concern Present (12/05/2022)   [employer] of Occupational Health - Occupational Stress Questionnaire    Feeling of Stress : Not at all  Social Connections: Moderately Integrated (12/08/2023)   Social Connection and Isolation Panel    Frequency of Communication with Friends and Family: More than three times a week    Frequency of Social Gatherings with Friends and Family: More than three times a week    Attends Religious Services: More than 4 times per year    Active Member of [employer] or Organizations: No    Attends Banker Meetings: Never    Marital Status: Married        Objective:   Physical Exam Vitals reviewed.  Constitutional:      General: She is not in acute distress.    Appearance: She is well-developed. She is obese.  HENT:     Head: Normocephalic and atraumatic.     Right Ear: Tympanic membrane normal.     Left Ear: Tympanic membrane normal.   Eyes:     Pupils: Pupils are equal, round, and reactive to light.   Neck:     Thyroid : No thyromegaly.   Cardiovascular:     Rate and Rhythm: Normal rate and regular rhythm.     Heart sounds: Normal heart sounds. No murmur heard. Pulmonary:     Effort: Pulmonary effort is normal. No  respiratory distress.     Breath sounds: Normal breath sounds. No wheezing.  Abdominal:     General: Bowel sounds are normal. There is no distension.     Palpations: Abdomen is soft.     Tenderness: There is no abdominal tenderness.   Musculoskeletal:        General: Tenderness present. Normal range of motion.     Cervical back: Normal range of motion and neck supple.     Comments: Pain in right shoulder with abduction and pain in left knee with flexion   Skin:    General: Skin is warm and dry.   Neurological:     Mental Status: She is alert and oriented to person,  place, and time.     Cranial Nerves: No cranial nerve deficit.     Deep Tendon Reflexes: Reflexes are normal and symmetric.   Psychiatric:        Behavior: Behavior normal.        Thought Content: Thought content normal.        Judgment: Judgment normal.       BP 129/75   Pulse 93   Temp (!) 97.1 F (36.2 C) (Temporal)   Ht 5' 4 (1.626 m)   Wt 231 lb (104.8 kg)   SpO2 97%   BMI 39.65 kg/m      Assessment & Plan:  Cynthia Leon comes in today with chief complaint of Medical Management of Chronic Issues (Left leg sore from getting up in daughters truck. Hands swelling and painful )   Diagnosis and orders addressed:  1. Annual physical exam (Primary) - CMP14+EGFR - CBC with Differential/Platelet - Lipid panel - Bayer DCA Hb A1c Waived - TSH  2. GAD (generalized anxiety disorder) - ALPRAZolam  (XANAX ) 0.5 MG tablet; Take 1 tablet (0.5 mg total) by mouth at bedtime as needed for anxiety.  Dispense: 30 tablet; Refill: 2 - CMP14+EGFR - CBC with Differential/Platelet  3. Controlled substance agreement signed - ALPRAZolam  (XANAX ) 0.5 MG tablet; Take 1 tablet (0.5 mg total) by mouth at bedtime as needed for anxiety.  Dispense: 30 tablet; Refill: 2 - CMP14+EGFR - CBC with Differential/Platelet  4. Benzodiazepine dependence (HCC) - ALPRAZolam  (XANAX ) 0.5 MG tablet; Take 1 tablet (0.5 mg total) by  mouth at bedtime as needed for anxiety.  Dispense: 30 tablet; Refill: 2 - CMP14+EGFR - CBC with Differential/Platelet  5. Fibromyalgia - cyclobenzaprine  (FLEXERIL ) 10 MG tablet; Take 1 tablet (10 mg total) by mouth 3 (three) times daily as needed for muscle spasms.  Dispense: 90 tablet; Refill: 1 - CMP14+EGFR - CBC with Differential/Platelet  6. Hyperlipidemia, unspecified hyperlipidemia type - rosuvastatin  (CRESTOR ) 20 MG tablet; Take 1 tablet (20 mg total) by mouth daily.  Dispense: 100 tablet; Refill: 2 - CMP14+EGFR - CBC with Differential/Platelet - Lipid panel  7. Gastroesophageal reflux disease, unspecified whether esophagitis present - CMP14+EGFR - CBC with Differential/Platelet  8. Vitamin D  deficiency - CMP14+EGFR - CBC with Differential/Platelet - VITAMIN D  25 Hydroxy (Vit-D Deficiency, Fractures)  9. Morbid obesity (HCC) - CMP14+EGFR - CBC with Differential/Platelet  10. Type 2 diabetes mellitus with diabetic polyneuropathy, without long-term current use of insulin (HCC) - CMP14+EGFR - CBC with Differential/Platelet - Bayer DCA Hb A1c Waived - TSH - Vitamin B12  11. Constipation, unspecified constipation type - CMP14+EGFR - CBC with Differential/Platelet  12. Primary osteoarthritis involving multiple joints - CMP14+EGFR - CBC with Differential/Platelet  13. Dyslipidemia - CMP14+EGFR - CBC with Differential/Platelet - Lipid panel  14. SOB (shortness of breath) - albuterol (VENTOLIN HFA) 108 (90 Base) MCG/ACT inhaler; Inhale 2 puffs into the lungs every 6 (six) hours as needed for wheezing or shortness of breath.  Dispense: 8 g; Refill: 0   Low carb diet Patient reviewed in [place] controlled database, no flags noted. Contract and drug screen are up to date.  Continue current medications Continue current medications  Health Maintenance reviewed Diet and exercise encouraged  Follow up plan: 3 months   Bari Learn, FNP

## 2024-01-19 NOTE — Patient Instructions (Signed)

## 2024-01-20 LAB — CMP14+EGFR
ALT: 14 IU/L (ref 0–32)
AST: 11 IU/L (ref 0–40)
Albumin: 4.4 g/dL (ref 3.8–4.8)
Alkaline Phosphatase: 95 IU/L (ref 44–121)
BUN/Creatinine Ratio: 11 — ABNORMAL LOW (ref 12–28)
BUN: 9 mg/dL (ref 8–27)
Bilirubin Total: 0.3 mg/dL (ref 0.0–1.2)
CO2: 21 mmol/L (ref 20–29)
Calcium: 9.9 mg/dL (ref 8.7–10.3)
Chloride: 99 mmol/L (ref 96–106)
Creatinine, Ser: 0.8 mg/dL (ref 0.57–1.00)
Globulin, Total: 2.1 g/dL (ref 1.5–4.5)
Glucose: 123 mg/dL — ABNORMAL HIGH (ref 70–99)
Potassium: 4.8 mmol/L (ref 3.5–5.2)
Sodium: 138 mmol/L (ref 134–144)
Total Protein: 6.5 g/dL (ref 6.0–8.5)
eGFR: 78 mL/min/{1.73_m2} (ref >59)

## 2024-01-20 LAB — CBC WITH DIFFERENTIAL/PLATELET
Basophils Absolute: 0.1 10*3/uL (ref 0.0–0.2)
Basos: 1 %
EOS (ABSOLUTE): 0.5 10*3/uL — ABNORMAL HIGH (ref 0.0–0.4)
Eos: 5 %
Hematocrit: 42.3 % (ref 34.0–46.6)
Hemoglobin: 13.5 g/dL (ref 11.1–15.9)
Immature Grans (Abs): 0 10*3/uL (ref 0.0–0.1)
Immature Granulocytes: 0 %
Lymphocytes Absolute: 1.7 10*3/uL (ref 0.7–3.1)
Lymphs: 16 %
MCH: 28.7 pg (ref 26.6–33.0)
MCHC: 31.9 g/dL (ref 31.5–35.7)
MCV: 90 fL (ref 79–97)
Monocytes Absolute: 0.7 10*3/uL (ref 0.1–0.9)
Monocytes: 7 %
Neutrophils Absolute: 7.3 10*3/uL — ABNORMAL HIGH (ref 1.4–7.0)
Neutrophils: 71 %
Platelets: 315 10*3/uL (ref 150–450)
RBC: 4.71 x10E6/uL (ref 3.77–5.28)
RDW: 13 % (ref 11.7–15.4)
WBC: 10.3 10*3/uL (ref 3.4–10.8)

## 2024-01-20 LAB — LIPID PANEL
Chol/HDL Ratio: 4.9 ratio — ABNORMAL HIGH (ref 0.0–4.4)
Cholesterol, Total: 206 mg/dL — ABNORMAL HIGH (ref 100–199)
HDL: 42 mg/dL (ref >39)
LDL Chol Calc (NIH): 127 mg/dL — ABNORMAL HIGH (ref 0–99)
Triglycerides: 206 mg/dL — ABNORMAL HIGH (ref 0–149)
VLDL Cholesterol Cal: 37 mg/dL (ref 5–40)

## 2024-01-20 LAB — VITAMIN D 25 HYDROXY (VIT D DEFICIENCY, FRACTURES): Vit D, 25-Hydroxy: 26.3 ng/mL — ABNORMAL LOW (ref 30.0–100.0)

## 2024-01-20 LAB — VITAMIN B12: Vitamin B-12: 631 pg/mL (ref 232–1245)

## 2024-01-20 LAB — TSH: TSH: 2.4 u[IU]/mL (ref 0.450–4.500)

## 2024-01-31 ENCOUNTER — Other Ambulatory Visit: Payer: Self-pay | Admitting: Internal Medicine

## 2024-02-11 ENCOUNTER — Other Ambulatory Visit: Payer: Self-pay | Admitting: Family

## 2024-02-11 DIAGNOSIS — R0602 Shortness of breath: Secondary | ICD-10-CM

## 2024-02-29 ENCOUNTER — Other Ambulatory Visit: Payer: Self-pay | Admitting: Family

## 2024-02-29 DIAGNOSIS — M150 Primary generalized (osteo)arthritis: Secondary | ICD-10-CM

## 2024-03-17 ENCOUNTER — Other Ambulatory Visit: Payer: Self-pay | Admitting: Family

## 2024-03-17 DIAGNOSIS — Z1231 Encounter for screening mammogram for malignant neoplasm of breast: Secondary | ICD-10-CM

## 2024-03-24 ENCOUNTER — Ambulatory Visit
Admission: RE | Admit: 2024-03-24 | Discharge: 2024-03-24 | Disposition: A | Discharge status: Home / Self Care | Source: Ambulatory Visit

## 2024-03-24 DIAGNOSIS — Z1231 Encounter for screening mammogram for malignant neoplasm of breast: Secondary | ICD-10-CM | POA: Diagnosis not present

## 2024-04-09 ENCOUNTER — Other Ambulatory Visit: Payer: Self-pay | Admitting: Internal Medicine

## 2024-04-09 ENCOUNTER — Other Ambulatory Visit: Payer: Self-pay | Admitting: Family

## 2024-04-09 DIAGNOSIS — M150 Primary generalized (osteo)arthritis: Secondary | ICD-10-CM

## 2024-04-22 ENCOUNTER — Encounter: Payer: Self-pay | Admitting: Family

## 2024-04-22 ENCOUNTER — Ambulatory Visit: Admitting: Family

## 2024-04-22 VITALS — BP 115/70 | HR 89 | Temp 97.3°F | Ht 64.0 in | Wt 231.8 lb

## 2024-04-22 DIAGNOSIS — M797 Fibromyalgia: Secondary | ICD-10-CM | POA: Diagnosis not present

## 2024-04-22 DIAGNOSIS — M150 Primary generalized (osteo)arthritis: Secondary | ICD-10-CM

## 2024-04-22 DIAGNOSIS — E785 Hyperlipidemia, unspecified: Secondary | ICD-10-CM | POA: Diagnosis not present

## 2024-04-22 DIAGNOSIS — F1320 Sedative, hypnotic or anxiolytic dependence, uncomplicated: Secondary | ICD-10-CM

## 2024-04-22 DIAGNOSIS — F411 Generalized anxiety disorder: Secondary | ICD-10-CM | POA: Diagnosis not present

## 2024-04-22 DIAGNOSIS — K219 Gastro-esophageal reflux disease without esophagitis: Secondary | ICD-10-CM | POA: Diagnosis not present

## 2024-04-22 DIAGNOSIS — K5900 Constipation, unspecified: Secondary | ICD-10-CM | POA: Diagnosis not present

## 2024-04-22 DIAGNOSIS — E559 Vitamin D deficiency, unspecified: Secondary | ICD-10-CM

## 2024-04-22 DIAGNOSIS — Z79899 Other long term (current) drug therapy: Secondary | ICD-10-CM | POA: Diagnosis not present

## 2024-04-22 DIAGNOSIS — E1169 Type 2 diabetes mellitus with other specified complication: Secondary | ICD-10-CM

## 2024-04-22 MED ORDER — TOPIRAMATE 50 MG PO TABS: 50.0000 mg | ORAL_TABLET | Freq: Two times a day (BID) | ORAL | 2 refills | Status: AC

## 2024-04-22 MED ORDER — DULOXETINE HCL 60 MG PO CPEP
60.0000 mg | ORAL_CAPSULE | Freq: Every day | ORAL | 2 refills | Status: AC
Start: 2024-04-22 — End: ?

## 2024-04-22 MED ORDER — ALPRAZOLAM 0.5 MG PO TABS: 0.5000 mg | ORAL_TABLET | Freq: Every evening | ORAL | 2 refills | Status: AC | PRN

## 2024-04-22 MED ORDER — CYCLOBENZAPRINE HCL 10 MG PO TABS: 10.0000 mg | ORAL_TABLET | Freq: Three times a day (TID) | ORAL | 1 refills | Status: AC | PRN

## 2024-04-22 NOTE — Patient Instructions (Signed)
 Peripheral Edema  Peripheral edema is swelling that is caused by a buildup of fluid. Peripheral edema most often affects the lower legs, ankles, and feet. It can also develop in the arms, hands, and face. The area of the body that has peripheral edema will look swollen. It may also feel heavy or warm. Your clothes may start to feel tight. Pressing on the area may make a temporary dent in your skin (pitting edema). You may not be able to move your swollen arm or leg as much as usual. There are many causes of peripheral edema. It can happen because of a complication of other conditions such as heart failure, kidney disease, or a problem with your circulation. It also can be a side effect of certain medicines or happen because of an infection. It often happens to women during pregnancy. Sometimes, the cause is not known. Follow these instructions at home: Managing pain, stiffness, and swelling  Raise (elevate) your legs while you are sitting or lying down. Move around often to prevent stiffness and to reduce swelling. Do not sit or stand for long periods of time. Do not wear tight clothing. Do not wear garters on your upper legs. Exercise your legs to get your circulation going. This helps to move the fluid back into your blood vessels, and it may help the swelling go down. Wear compression stockings as told by your health care provider. These stockings help to prevent blood clots and reduce swelling in your legs. It is important that these are the correct size. These stockings should be prescribed by your doctor to prevent possible injuries. If elastic bandages or wraps are recommended, use them as told by your health care provider. Medicines Take over-the-counter and prescription medicines only as told by your health care provider. Your health care provider may prescribe medicine to help your body get rid of excess water (diuretic). Take this medicine if you are told to take it. General  instructions Eat a low-salt (low-sodium) diet as told by your health care provider. Sometimes, eating less salt may reduce swelling. Pay attention to any changes in your symptoms. Moisturize your skin daily to help prevent skin from cracking and draining. Keep all follow-up visits. This is important. Contact a health care provider if: You have a fever. You have swelling in only one leg. You have increased swelling, redness, or pain in one or both of your legs. You have drainage or sores at the area where you have edema. Get help right away if: You have edema that starts suddenly or is getting worse, especially if you are pregnant or have a medical condition. You develop shortness of breath, especially when you are lying down. You have pain in your chest or abdomen. You feel weak. You feel like you will faint. These symptoms may be an emergency. Get help right away. Call 911. Do not wait to see if the symptoms will go away. Do not drive yourself to the hospital. Summary Peripheral edema is swelling that is caused by a buildup of fluid. Peripheral edema most often affects the lower legs, ankles, and feet. Move around often to prevent stiffness and to reduce swelling. Do not sit or stand for long periods of time. Pay attention to any changes in your symptoms. Contact a health care provider if you have edema that starts suddenly or is getting worse, especially if you are pregnant or have a medical condition. Get help right away if you develop shortness of breath, especially when lying down.  This information is not intended to replace advice given to you by your health care provider. Make sure you discuss any questions you have with your health care provider. Document Revised: 03/19/2021 Document Reviewed: 03/19/2021 Elsevier Patient Education  2024 [employer].

## 2024-04-22 NOTE — Progress Notes (Signed)
 Subjective:    Patient ID: Cynthia Leon, female    DOB: 09/16/1950, 73 y.o.   MRN: [id number]  Chief Complaint  Patient presents with   Medical Management of Chronic Issues   Pt presents to the office today for chronic follow up.   Pt is morbid obese today with a BMI of 39 and co-moribility of DM and GERD.    Gastroesophageal Reflux She complains of belching and heartburn. This is a chronic problem. The current episode started more than 1 year ago. The problem occurs occasionally. The symptoms are aggravated by certain foods. Risk factors include obesity. She has tried a PPI for the symptoms. The treatment provided moderate relief.  Diabetes She presents for her follow-up diabetic visit. She has type 2 diabetes mellitus. Hypoglycemia symptoms include nervousness/anxiousness. Associated symptoms include blurred vision. Pertinent negatives for diabetes include no foot paresthesias. Symptoms are stable. Risk factors for coronary artery disease include dyslipidemia, diabetes mellitus, hypertension, sedentary lifestyle, post-menopausal and obesity. She is following a generally unhealthy diet. (Does not check glucose at home) Eye exam is not current.  Arthritis Presents for follow-up visit. She complains of pain and stiffness. The symptoms have been stable. Affected locations include the right knee, left knee, left shoulder and right shoulder. Her pain is at a severity of 8/10.  Hyperlipidemia This is a chronic problem. The current episode started more than 1 year ago. The problem is uncontrolled. Recent lipid tests were reviewed and are high. Exacerbating diseases include obesity. Current antihyperlipidemic treatment includes statins. The current treatment provides moderate improvement of lipids. Risk factors for coronary artery disease include dyslipidemia, diabetes mellitus, hypertension, a sedentary lifestyle and obesity.  Anxiety Presents for follow-up visit. Symptoms include excessive  worry, nervous/anxious behavior and palpitations. Symptoms occur occasionally. The severity of symptoms is moderate.    Constipation This is a chronic problem. The current episode started more than 1 year ago. The problem has been resolved since onset. Her stool frequency is 1 time per day. Risk factors include obesity. She has tried laxatives for the symptoms. The treatment provided moderate relief.      Review of Systems  Eyes:  Positive for blurred vision.  Cardiovascular:  Positive for palpitations.  Gastrointestinal:  Positive for constipation and heartburn.  Musculoskeletal:  Positive for stiffness.  Psychiatric/Behavioral:  The patient is nervous/anxious.   All other systems reviewed and are negative.  Family History  Problem Relation Age of Onset   Breast cancer Mother    Coronary artery disease Mother    Cancer Mother        breast   Emphysema Father    COPD Father    Hypertension Father    Heart disease Father    Diabetes Sister    Cancer Sister        throat   Coronary artery disease Sister    Diabetes Sister    Esophageal cancer Sister    Cancer Brother        brain   Cancer Brother        bone   Cancer Brother        lung   Cancer Brother    Lupus Brother    Healthy Daughter    Healthy Daughter    Healthy Daughter    Healthy Daughter    Social History   Socioeconomic History   Marital status: Married    Spouse name: Jayson   Number of children: 3   Years of education: 51  Highest education level: 11th grade  Occupational History   Occupation: disabled   Tobacco Use   Smoking status: Never    Passive exposure: Yes   Smokeless tobacco: Never  Vaping Use   Vaping status: Never Used  Substance and Sexual Activity   Alcohol use: No   Drug use: No   Sexual activity: Yes  Other Topics Concern   Not on file  Social History Narrative   Lives with her husband, their 3 daughters all live nearby   Social Drivers of Health   Financial Resource  Strain: Low Risk  (12/08/2023)   Overall Financial Resource Strain (CARDIA)    Difficulty of Paying Living Expenses: Not hard at all  Food Insecurity: No Food Insecurity (12/08/2023)   Hunger Vital Sign    Worried About Running Out of Food in the Last Year: Never true    Ran Out of Food in the Last Year: Never true  Transportation Needs: No Transportation Needs (12/08/2023)   PRAPARE - Administrator, Civil Service (Medical): No    Lack of Transportation (Non-Medical): No  Physical Activity: Inactive (12/08/2023)   Exercise Vital Sign    Days of Exercise per Week: 7 days    Minutes of Exercise per Session: 0 min  Stress: No Stress Concern Present (12/05/2022)   [employer] of Occupational Health - Occupational Stress Questionnaire    Feeling of Stress : Not at all  Social Connections: Moderately Integrated (12/08/2023)   Social Connection and Isolation Panel    Frequency of Communication with Friends and Family: More than three times a week    Frequency of Social Gatherings with Friends and Family: More than three times a week    Attends Religious Services: More than 4 times per year    Active Member of [employer] or Organizations: No    Attends Banker Meetings: Never    Marital Status: Married        Objective:   Physical Exam Vitals reviewed.  Constitutional:      General: She is not in acute distress.    Appearance: She is well-developed. She is obese.  HENT:     Head: Normocephalic and atraumatic.     Right Ear: Tympanic membrane normal.     Left Ear: Tympanic membrane normal.  Eyes:     Pupils: Pupils are equal, round, and reactive to light.  Neck:     Thyroid : No thyromegaly.  Cardiovascular:     Rate and Rhythm: Normal rate and regular rhythm.     Heart sounds: Normal heart sounds. No murmur heard. Pulmonary:     Effort: Pulmonary effort is normal. No respiratory distress.     Breath sounds: Normal breath sounds. No wheezing.  Abdominal:      General: Bowel sounds are normal. There is no distension.     Palpations: Abdomen is soft.     Tenderness: There is no abdominal tenderness.  Musculoskeletal:        General: Tenderness present. Normal range of motion.     Cervical back: Normal range of motion and neck supple.     Comments: Pain in right shoulder with abduction and pain in left knee with flexion  Skin:    General: Skin is warm and dry.  Neurological:     Mental Status: She is alert and oriented to person, place, and time.     Cranial Nerves: No cranial nerve deficit.     Deep Tendon Reflexes: Reflexes are normal  and symmetric.  Psychiatric:        Behavior: Behavior normal.        Thought Content: Thought content normal.        Judgment: Judgment normal.       BP 115/70   Pulse 89   Temp (!) 97.3 F (36.3 C) (Temporal)   Ht 5' 4 (1.626 m)   Wt 231 lb 12.8 oz (105.1 kg)   SpO2 95%   BMI 39.79 kg/m      Assessment & Plan:  Cynthia Leon comes in today with chief complaint of Medical Management of Chronic Issues   Diagnosis and orders addressed:  1. GAD (generalized anxiety disorder) - ALPRAZolam  (XANAX ) 0.5 MG tablet; Take 1 tablet (0.5 mg total) by mouth at bedtime as needed for anxiety.  Dispense: 30 tablet; Refill: 2 - DULoxetine  (CYMBALTA ) 60 MG capsule; Take 1 capsule (60 mg total) by mouth daily.  Dispense: 100 capsule; Refill: 2 - ToxASSURE Select 13 (MW), Urine  2. Controlled substance agreement signed - ALPRAZolam  (XANAX ) 0.5 MG tablet; Take 1 tablet (0.5 mg total) by mouth at bedtime as needed for anxiety.  Dispense: 30 tablet; Refill: 2 - ToxASSURE Select 13 (MW), Urine  3. Benzodiazepine dependence (HCC) - ALPRAZolam  (XANAX ) 0.5 MG tablet; Take 1 tablet (0.5 mg total) by mouth at bedtime as needed for anxiety.  Dispense: 30 tablet; Refill: 2  4. Fibromyalgia - cyclobenzaprine  (FLEXERIL ) 10 MG tablet; Take 1 tablet (10 mg total) by mouth 3 (three) times daily as needed for muscle  spasms.  Dispense: 90 tablet; Refill: 1 - DULoxetine  (CYMBALTA ) 60 MG capsule; Take 1 capsule (60 mg total) by mouth daily.  Dispense: 100 capsule; Refill: 2  5. Morbid obesity (HCC)  6. Dyslipidemia  7. Primary osteoarthritis involving multiple joints   8. Type 2 diabetes mellitus with other specified complication, without long-term current use of insulin (HCC) (Primary)  9. Constipation, unspecified constipation type  10. Vitamin D  deficiency  11. Gastroesophageal reflux disease, unspecified whether esophagitis present    Low carb diet Patient reviewed in [place] controlled database, no flags noted. Contract and drug screen are up to date.  Continue current medications Health Maintenance reviewed Diet and exercise encouraged  Follow up plan: 3 months   Bari Learn, FNP

## 2024-04-27 ENCOUNTER — Ambulatory Visit: Payer: Self-pay | Admitting: Family

## 2024-04-27 LAB — TOXASSURE SELECT 13 (MW), URINE

## 2024-05-26 LAB — COLOGUARD: COLOGUARD: POSITIVE — AB

## 2024-05-27 ENCOUNTER — Ambulatory Visit: Payer: Self-pay | Admitting: Family

## 2024-05-27 DIAGNOSIS — R195 Other fecal abnormalities: Secondary | ICD-10-CM

## 2024-07-26 ENCOUNTER — Ambulatory Visit: Payer: Self-pay | Admitting: Family

## 2024-07-26 ENCOUNTER — Encounter: Payer: Self-pay | Admitting: Family

## 2024-08-28 ENCOUNTER — Other Ambulatory Visit: Payer: Self-pay | Admitting: Family

## 2024-08-28 DIAGNOSIS — M150 Primary generalized (osteo)arthritis: Secondary | ICD-10-CM

## 2024-09-03 ENCOUNTER — Other Ambulatory Visit: Payer: Self-pay | Admitting: Internal Medicine
